# Patient Record
Sex: Male | Born: 1965 | Race: White | Hispanic: No | Smoking: Current every day smoker
Health system: Southern US, Community
[De-identification: ages and names within clinical notes are randomized; demographics above are authoritative.]

## PROBLEM LIST (undated history)

## (undated) DIAGNOSIS — I471 Supraventricular tachycardia, unspecified: Secondary | ICD-10-CM

## (undated) DIAGNOSIS — F4310 Post-traumatic stress disorder, unspecified: Secondary | ICD-10-CM

## (undated) DIAGNOSIS — Z95810 Presence of automatic (implantable) cardiac defibrillator: Secondary | ICD-10-CM

## (undated) DIAGNOSIS — I2510 Atherosclerotic heart disease of native coronary artery without angina pectoris: Secondary | ICD-10-CM

## (undated) DIAGNOSIS — F419 Anxiety disorder, unspecified: Secondary | ICD-10-CM

## (undated) DIAGNOSIS — I639 Cerebral infarction, unspecified: Secondary | ICD-10-CM

## (undated) DIAGNOSIS — I712 Thoracic aortic aneurysm, without rupture: Secondary | ICD-10-CM

## (undated) DIAGNOSIS — I480 Paroxysmal atrial fibrillation: Secondary | ICD-10-CM

## (undated) DIAGNOSIS — E119 Type 2 diabetes mellitus without complications: Secondary | ICD-10-CM

## (undated) DIAGNOSIS — F1910 Other psychoactive substance abuse, uncomplicated: Secondary | ICD-10-CM

## (undated) DIAGNOSIS — I10 Essential (primary) hypertension: Secondary | ICD-10-CM

## (undated) DIAGNOSIS — I209 Angina pectoris, unspecified: Secondary | ICD-10-CM

## (undated) DIAGNOSIS — I509 Heart failure, unspecified: Secondary | ICD-10-CM

## (undated) DIAGNOSIS — F329 Major depressive disorder, single episode, unspecified: Secondary | ICD-10-CM

## (undated) DIAGNOSIS — E7800 Pure hypercholesterolemia, unspecified: Secondary | ICD-10-CM

## (undated) DIAGNOSIS — G4733 Obstructive sleep apnea (adult) (pediatric): Secondary | ICD-10-CM

## (undated) DIAGNOSIS — I219 Acute myocardial infarction, unspecified: Secondary | ICD-10-CM

## (undated) DIAGNOSIS — F32A Depression, unspecified: Secondary | ICD-10-CM

## (undated) DIAGNOSIS — I7121 Aneurysm of the ascending aorta, without rupture: Secondary | ICD-10-CM

## (undated) DIAGNOSIS — R011 Cardiac murmur, unspecified: Secondary | ICD-10-CM

## (undated) DIAGNOSIS — M1990 Unspecified osteoarthritis, unspecified site: Secondary | ICD-10-CM

## (undated) HISTORY — PX: TONSILLECTOMY: SUR1361

## (undated) HISTORY — PX: MYOMECTOMY: SHX85

## (undated) HISTORY — DX: Obstructive sleep apnea (adult) (pediatric): G47.33

## (undated) HISTORY — DX: Depression, unspecified: F32.A

## (undated) HISTORY — DX: Anxiety disorder, unspecified: F41.9

## (undated) HISTORY — DX: Thoracic aortic aneurysm, without rupture: I71.2

## (undated) HISTORY — DX: Atherosclerotic heart disease of native coronary artery without angina pectoris: I25.10

## (undated) HISTORY — DX: Paroxysmal atrial fibrillation: I48.0

## (undated) HISTORY — PX: CARDIAC CATHETERIZATION: SHX172

## (undated) HISTORY — DX: Aneurysm of the ascending aorta, without rupture: I71.21

## (undated) HISTORY — DX: Other psychoactive substance abuse, uncomplicated: F19.10

## (undated) HISTORY — DX: Supraventricular tachycardia, unspecified: I47.10

## (undated) HISTORY — DX: Post-traumatic stress disorder, unspecified: F43.10

## (undated) HISTORY — DX: Supraventricular tachycardia: I47.1

## (undated) HISTORY — PX: CERVICAL DISCECTOMY: SHX98

## (undated) HISTORY — DX: Major depressive disorder, single episode, unspecified: F32.9

---

## 2013-02-13 DIAGNOSIS — Z9889 Other specified postprocedural states: Secondary | ICD-10-CM | POA: Insufficient documentation

## 2013-11-27 DIAGNOSIS — F259 Schizoaffective disorder, unspecified: Secondary | ICD-10-CM | POA: Insufficient documentation

## 2014-01-31 DIAGNOSIS — I639 Cerebral infarction, unspecified: Secondary | ICD-10-CM

## 2014-01-31 HISTORY — DX: Cerebral infarction, unspecified: I63.9

## 2014-02-04 DIAGNOSIS — J9811 Atelectasis: Secondary | ICD-10-CM | POA: Insufficient documentation

## 2014-02-05 DIAGNOSIS — E8770 Fluid overload, unspecified: Secondary | ICD-10-CM | POA: Insufficient documentation

## 2014-02-20 DIAGNOSIS — I4892 Unspecified atrial flutter: Secondary | ICD-10-CM | POA: Insufficient documentation

## 2015-04-30 ENCOUNTER — Ambulatory Visit: Payer: Self-pay | Admitting: Cardiovascular Disease

## 2015-05-27 ENCOUNTER — Encounter: Payer: Self-pay | Admitting: Family Medicine

## 2015-05-27 ENCOUNTER — Ambulatory Visit (INDEPENDENT_AMBULATORY_CARE_PROVIDER_SITE_OTHER): Payer: Commercial Managed Care - HMO | Admitting: Family Medicine

## 2015-05-27 VITALS — BP 146/91 | HR 68 | Temp 98.4°F | Ht 67.5 in | Wt 286.9 lb

## 2015-05-27 DIAGNOSIS — G4733 Obstructive sleep apnea (adult) (pediatric): Secondary | ICD-10-CM

## 2015-05-27 DIAGNOSIS — F17200 Nicotine dependence, unspecified, uncomplicated: Secondary | ICD-10-CM

## 2015-05-27 DIAGNOSIS — F411 Generalized anxiety disorder: Secondary | ICD-10-CM | POA: Diagnosis not present

## 2015-05-27 DIAGNOSIS — I10 Essential (primary) hypertension: Secondary | ICD-10-CM | POA: Diagnosis not present

## 2015-05-27 DIAGNOSIS — I422 Other hypertrophic cardiomyopathy: Secondary | ICD-10-CM | POA: Diagnosis not present

## 2015-05-27 DIAGNOSIS — F319 Bipolar disorder, unspecified: Secondary | ICD-10-CM | POA: Insufficient documentation

## 2015-05-27 MED ORDER — ORPHENADRINE CITRATE ER 100 MG PO TB12: 100.0000 mg | ORAL_TABLET | Freq: Two times a day (BID) | ORAL | Status: DC | PRN

## 2015-05-27 MED ORDER — METOPROLOL SUCCINATE ER 50 MG PO TB24: 50.0000 mg | ORAL_TABLET | Freq: Every day | ORAL | Status: DC

## 2015-05-27 MED ORDER — FUROSEMIDE 20 MG PO TABS: 20.0000 mg | ORAL_TABLET | Freq: Every day | ORAL | Status: DC

## 2015-05-27 MED ORDER — LOSARTAN POTASSIUM 50 MG PO TABS: 50.0000 mg | ORAL_TABLET | Freq: Every day | ORAL | Status: DC

## 2015-05-27 MED ORDER — DILTIAZEM HCL ER COATED BEADS 240 MG PO CP24: 240.0000 mg | ORAL_CAPSULE | Freq: Every day | ORAL | Status: DC

## 2015-05-27 NOTE — Patient Instructions (Signed)
Good to see you today!  Thanks for coming in.  We will start losartan 50 mg once daily - if any throat problems or cough stop and call us  Continue other medications as your are  We refer you to cardiology  We will send for your records  Come back in 2 weeks for a blood pressure and lab check  I will send in refills to [facility] mail order

## 2015-05-28 ENCOUNTER — Encounter: Payer: Self-pay | Admitting: Family Medicine

## 2015-05-28 ENCOUNTER — Telehealth: Payer: Self-pay | Admitting: Family Medicine

## 2015-05-28 DIAGNOSIS — F17200 Nicotine dependence, unspecified, uncomplicated: Secondary | ICD-10-CM | POA: Insufficient documentation

## 2015-05-28 MED ORDER — METOPROLOL SUCCINATE ER 50 MG PO TB24: 50.0000 mg | ORAL_TABLET | Freq: Every day | ORAL | Status: DC

## 2015-05-28 MED ORDER — FUROSEMIDE 20 MG PO TABS: 20.0000 mg | ORAL_TABLET | Freq: Every day | ORAL | Status: DC

## 2015-05-28 MED ORDER — DILTIAZEM HCL ER COATED BEADS 240 MG PO CP24: 240.0000 mg | ORAL_CAPSULE | Freq: Every day | ORAL | Status: DC

## 2015-05-28 NOTE — Telephone Encounter (Addendum)
I sent rx to [facility] at [place]  Pls remind him to sign his ROI forms for his Cardiologist and [facility]  Thanks  [employer]

## 2015-05-28 NOTE — Assessment & Plan Note (Signed)
BP Readings from Last 3 Encounters:  05/27/15 146/91   Near goal today but given history of decreased ejection fraction will add ARB at low dose.   Need to check labs on return

## 2015-05-28 NOTE — Telephone Encounter (Signed)
LM for patient to call back. Jazmin Hartsell,CMA  

## 2015-05-28 NOTE — Assessment & Plan Note (Signed)
Will send for records and investigate his symptoms further

## 2015-05-28 NOTE — Telephone Encounter (Signed)
Pt called because his medications Lasix, Toprol, and Cartia-xt will not be available to him for 7-10 business days because of the mail order process. He would like a 2 week supply of these 3 medications [facility] on [address] so that he will not be without his medication. Please call patient and let him know so that he can go pick these up from [facility]. jw

## 2015-05-28 NOTE — Assessment & Plan Note (Signed)
Stable  Send for records.  Refer to Cardiology for follow up and likely echo

## 2015-05-28 NOTE — Assessment & Plan Note (Signed)
Seems controlled although not on SSRI and using as needed ativan.  Will send for records

## 2015-05-28 NOTE — Progress Notes (Signed)
   Subjective:    Patient ID: Kyle Peterson, male    DOB: [birth year], 50 y.o.   MRN: [id number]  HPI Here to establish care.  Moving from [place] has lived in [place] and [place] in the past.  HYPERTENSION Disease Monitoring Home BP Monitoring (Severity) has not been checking Symptoms - Chest pain- no    Dyspnea- no Medications(Modifying factors) Compliance-  Had been prescribed Lisinopril in past but had cough and throat tightness - no shortness of breath or swelling.  He stopped taking on his own and never sought other medication  Lightheadedness-  no  Edema- trace from ankle injury Timing - continuous  Duration - years  Anxiety Long history of being anxious and active.  Has ativan which he uses rarely.  Has history of substance abuse.  No longer using any illicit drugs or alcohol.  Feels it is pretty well controlled currently  Sleep Apnea Had home sleep study in [place].  Was told had sleep apnea but never got CPAP.  He is not sure if he has any apnic spells.  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  Relates had atrial fibrillation and decresed ejection fraction after surgery and had AID placed in Jan 2016.   Has some fatigue with exercise but no chest pain other than twinges at insertion site.  Minimal edema but has been on lasix since surgery.  Unsure when his last echo was.  Was told he had an MI in past from testing he underwent prior to surgery.    Has been prescribed Lipitor in past but stopped taking months ago - Rx ran out  Tobacco Use 1/2 ppd.  No strong desire to stop currenlty   Patient reports no  vision/ hearing changes,anorexia, weight change, fever ,adenopathy, persistant / recurrent hoarseness, swallowing issues, chest pain, edema,persistant / recurrent cough, hemoptysis, dyspnea(rest, exertional, paroxysmal nocturnal), gastrointestinal  bleeding (melena, rectal bleeding), abdominal pain, excessive heart burn, GU symptoms(dysuria, hematuria,  pyuria, voiding/incontinence  Issues) syncope, focal weakness, severe memory loss, depression,, abnormal bruising/bleeding, major joint swelling.    Chief Complaint noted Review of Symptoms - see HPI PMH - Smoking status noted.   Vital Signs reviewed    Review of Systems     Objective:   Physical Exam  Alert nad Heart - Regular rate and rhythm.  No murmurs, gallops or rubs.    Lungs:  Normal respiratory effort, chest expands symmetrically. Lungs are clear to auscultation, no crackles or wheezes. Abdomen: obese soft and non-tender without masses, organomegaly or hernias noted.  No guarding or rebound Extremities:  No cyanosis, edema, or deformity noted with good range of motion of all major joints.   Skin - dermatofibroma on left posterior shoulder Eye - Pupils Equal Round Reactive to light, Extraocular movements intact, Conjunctiva without redness or discharge Ears:  External ear exam shows no significant lesions or deformities.  Otoscopic examination reveals clear canals, tympanic membranes are intact bilaterally without bulging, retraction, inflammation or discharge. Hearing is grossly normal bilaterall Mouth - no lesions, mucous membranes are moist, no decaying teeth  Psych:  Cognition and judgment appear intact. Alert, communicative  and cooperative with normal attention span and concentration. No apparent delusions, illusions, hallucinations Neck:  No deformities, thyromegaly, masses, or tenderness noted.   Supple with full range of motion without pain.        Assessment & Plan:

## 2015-05-29 NOTE — Telephone Encounter (Signed)
Pt did come by and sign forms, pt also informed of RXs at the pharmacy.

## 2015-06-10 ENCOUNTER — Ambulatory Visit (INDEPENDENT_AMBULATORY_CARE_PROVIDER_SITE_OTHER): Payer: Commercial Managed Care - HMO | Admitting: Family Medicine

## 2015-06-10 ENCOUNTER — Encounter: Payer: Self-pay | Admitting: Family Medicine

## 2015-06-10 VITALS — BP 122/88 | HR 78 | Temp 98.6°F | Ht 67.5 in | Wt 288.0 lb

## 2015-06-10 DIAGNOSIS — Z7251 High risk heterosexual behavior: Secondary | ICD-10-CM | POA: Diagnosis not present

## 2015-06-10 DIAGNOSIS — E785 Hyperlipidemia, unspecified: Secondary | ICD-10-CM | POA: Diagnosis not present

## 2015-06-10 DIAGNOSIS — F17200 Nicotine dependence, unspecified, uncomplicated: Secondary | ICD-10-CM

## 2015-06-10 DIAGNOSIS — G4733 Obstructive sleep apnea (adult) (pediatric): Secondary | ICD-10-CM

## 2015-06-10 DIAGNOSIS — M25511 Pain in right shoulder: Secondary | ICD-10-CM

## 2015-06-10 DIAGNOSIS — M25512 Pain in left shoulder: Secondary | ICD-10-CM

## 2015-06-10 DIAGNOSIS — I10 Essential (primary) hypertension: Secondary | ICD-10-CM | POA: Diagnosis not present

## 2015-06-10 DIAGNOSIS — F419 Anxiety disorder, unspecified: Secondary | ICD-10-CM | POA: Diagnosis not present

## 2015-06-10 DIAGNOSIS — F411 Generalized anxiety disorder: Secondary | ICD-10-CM | POA: Diagnosis not present

## 2015-06-10 LAB — CBC
HCT: 45.1 % (ref 38.5–50.0)
HEMOGLOBIN: 13.7 g/dL (ref 13.2–17.1)
MCH: 22.1 pg — AB (ref 27.0–33.0)
MCHC: 30.4 g/dL — ABNORMAL LOW (ref 32.0–36.0)
MCV: 72.7 fL — ABNORMAL LOW (ref 80.0–100.0)
Platelets: 325 10*3/uL (ref 140–400)
RBC: 6.2 MIL/uL — AB (ref 4.20–5.80)
RDW: 19.3 % — ABNORMAL HIGH (ref 11.0–15.0)
WBC: 9.4 10*3/uL (ref 3.8–10.8)

## 2015-06-10 LAB — COMPREHENSIVE METABOLIC PANEL
ALK PHOS: 89 U/L (ref 40–115)
ALT: 16 U/L (ref 9–46)
AST: 18 U/L (ref 10–40)
Albumin: 4.4 g/dL (ref 3.6–5.1)
BUN: 11 mg/dL (ref 7–25)
CALCIUM: 9 mg/dL (ref 8.6–10.3)
CO2: 28 mmol/L (ref 20–31)
Chloride: 103 mmol/L (ref 98–110)
Creat: 1.2 mg/dL (ref 0.60–1.35)
GLUCOSE: 124 mg/dL — AB (ref 65–99)
POTASSIUM: 4.3 mmol/L (ref 3.5–5.3)
Sodium: 141 mmol/L (ref 135–146)
Total Bilirubin: 0.4 mg/dL (ref 0.2–1.2)
Total Protein: 7.1 g/dL (ref 6.1–8.1)

## 2015-06-10 LAB — LIPID PANEL
CHOLESTEROL: 211 mg/dL — AB (ref 125–200)
HDL: 38 mg/dL — AB (ref >=40)
LDL Cholesterol: 133 mg/dL — ABNORMAL HIGH (ref <130)
TRIGLYCERIDES: 200 mg/dL — AB (ref <150)
Total CHOL/HDL Ratio: 5.6 Ratio — ABNORMAL HIGH (ref <=5.0)
VLDL: 40 mg/dL — ABNORMAL HIGH (ref <30)

## 2015-06-10 NOTE — Patient Instructions (Signed)
Good to see you today!  Thanks for coming in.  See your cardiologist  - take your paperwork  I will call you if your tests are not good.  Otherwise I will send you a letter.  If you do not hear from me with in 2 weeks please call our office.     Continue the medications as you are  Try to establish a routine - same time to bed and getting up and regular smaller meals   Come up with a plan to stop smoking - Find a substitute   Come back in 3 months or sooner if anything is worsening

## 2015-06-11 DIAGNOSIS — M25519 Pain in unspecified shoulder: Secondary | ICD-10-CM | POA: Insufficient documentation

## 2015-06-11 LAB — HIV ANTIBODY (ROUTINE TESTING W REFLEX): HIV 1&2 Ab, 4th Generation: NONREACTIVE

## 2015-06-11 LAB — HEPATITIS C ANTIBODY: HCV AB: REACTIVE — AB

## 2015-06-11 NOTE — Assessment & Plan Note (Signed)
See avs

## 2015-06-11 NOTE — Assessment & Plan Note (Addendum)
Will refer to surgery.  Gave patient his medical records to give to ortho

## 2015-06-11 NOTE — Assessment & Plan Note (Signed)
BP Readings from Last 3 Encounters:  06/10/15 122/88  05/27/15 146/91   At goal today

## 2015-06-11 NOTE — Assessment & Plan Note (Signed)
Doubt is at goal check labs.   Likely restart statin

## 2015-06-11 NOTE — Progress Notes (Signed)
   Subjective:    Patient ID: Kyle Peterson, male    DOB: Apr 09, 1965, 50 y.o.   MRN: [id number]  HPI  HYPERTENSION Disease Monitoring Home BP Monitoring (Severity) has not been checking Symptoms - Chest pain- no    Dyspnea- no Medications(Modifying factors) Compliance-  Taking medications . Lightheadedness-  no  Edema- no Timing - continuous  Duration - years ROS - See HPI  PMH Lab Review   POTASSIUM  Date Value Ref Range Status  06/10/2015 4.3 3.5 - 5.3 mmol/L Final   SODIUM  Date Value Ref Range Status  06/10/2015 141 135 - 146 mmol/L Final   CREAT  Date Value Ref Range Status  06/10/2015 1.20 0.60 - 1.35 mg/dL Final        HYPERLIPIDEMIA Symptoms Chest pain on exertion:  has   Leg claudication:   no Medications (modifying factor): Compliance- has been off statin for months Right upper quadrant pain- no  Muscle aches- no Duration - years   Timing - continuous    Component Value Date/Time   CHOL 211* 06/10/2015 0924   TRIG 200* 06/10/2015 0924   HDL 38* 06/10/2015 0924   VLDL 40* 06/10/2015 0924   CHOLHDL 5.6* 06/10/2015 0924    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  Told had torn rotator cuff and capsulitis in past How interferes with function (severity): difficult to lift overhead Medications: nsaids and tylenol Course:has had for years but could not get operated on with recent HOCUM diagnosis and surgery  Stomach Pain: no Blood in stools: no Rash:no Fever: no   Chief Complaint noted Review of Symptoms - see HPI PMH - Smoking status noted.   Vital Signs reviewed   Review of Systems     Objective:   Physical Exam  Alert nad Limited ROM in both shoulder especially abduction Distal hand strenght and senation are intact Heart - Distant Regular rate and rhythm.  No murmurs, gallops or rubs.    Lungs:  Normal respiratory effort, chest expands symmetrically. Lungs are clear to auscultation, no crackles or wheezes.         Assessment & Plan:

## 2015-06-11 NOTE — Assessment & Plan Note (Signed)
Once has cardiology evaluation will refer for sleep study

## 2015-06-12 LAB — HEPATITIS C RNA QUANTITATIVE: HCV Quantitative: NOT DETECTED IU/mL (ref <15)

## 2015-06-15 ENCOUNTER — Encounter: Payer: Self-pay | Admitting: Internal Medicine

## 2015-06-15 ENCOUNTER — Ambulatory Visit (INDEPENDENT_AMBULATORY_CARE_PROVIDER_SITE_OTHER): Payer: Commercial Managed Care - HMO | Admitting: Internal Medicine

## 2015-06-15 VITALS — BP 139/92 | HR 71 | Temp 98.2°F | Ht 67.5 in | Wt 288.5 lb

## 2015-06-15 DIAGNOSIS — H00013 Hordeolum externum right eye, unspecified eyelid: Secondary | ICD-10-CM | POA: Diagnosis not present

## 2015-06-15 MED ORDER — TOBRAMYCIN 0.3 % OP SOLN: 2.0000 [drp] | OPHTHALMIC | Status: DC

## 2015-06-15 NOTE — Patient Instructions (Signed)
[name],  I have prescribed tobramycin eye drops and please use warm compresses as often as you can tolerate to encourage drainage.  If you do not have improvement in swelling by 48 hours from now, please come back to clinic.  Best, Dr. Sampson GoonFitzgerald

## 2015-06-15 NOTE — Progress Notes (Signed)
   Subjective:    Patient ID: Kyle Peterson, male    DOB: 08/06/1965, 50 y.o.   MRN: [id number]  HPI  Kyle Peterson is a 49-y/o male who presents with concern for stye.  Stye: - Began 3-4 days ago of right upper eyelid. - Swelling has gotten worse since yesterday and eye feels irritated. - Noted a crust in his eye this morning.  - Has been taking naproxen with little relief. Just began using warm compresses this morning.  - Feels stye has come to a head but denies any drainage yet. - Had a stye about 1-2 years ago that resolved with warm compresses and tobramycin eye drops - Denies any recent injury to eye  Review of Systems  Constitutional: Negative for fever and appetite change.  HENT: Negative for congestion and rhinorrhea.   Eyes: Negative for visual disturbance.  Gastrointestinal: Negative for nausea, vomiting and diarrhea.      Objective: Blood pressure 139/92, pulse 71, temperature 98.2 F (36.8 C), temperature source Oral, height 5' 7.5" (1.715 m), weight 288 lb 8 oz (130.863 kg).   Physical Exam  Constitutional: He appears well-developed and well-nourished. No distress.  Eyes: EOM are normal. Pupils are equal, round, and reactive to light.  Bilateral conjunctival epithelial inclusion cysts beneath lateral corners of upper eyelids.  Skin:  Erythema of right upper eyelid surrounding small pustule.            Assessment & Plan:  Patient with stye and no systemic symptoms. Swelling is worsening. Recommended warm compresses and prescribed tobramycin drops. Conjunctival epithelial inclusion cysts noted on exam, but patient denies being bothered by them or having eye pain except for stye.   Return within 48 hours if swelling is not improving. Referral to ophthalmology for excision would likely be warranted at that time.   Dani GobbleHillary Fitzgerald, MD [facility], PGY-1

## 2015-06-16 ENCOUNTER — Encounter: Payer: Self-pay | Admitting: Cardiology

## 2015-06-16 ENCOUNTER — Ambulatory Visit (HOSPITAL_COMMUNITY)
Admission: EM | Admit: 2015-06-16 | Discharge: 2015-06-16 | Disposition: A | Discharge status: Home / Self Care | Payer: Commercial Managed Care - HMO | Attending: Family Medicine | Admitting: Family Medicine

## 2015-06-16 ENCOUNTER — Ambulatory Visit (INDEPENDENT_AMBULATORY_CARE_PROVIDER_SITE_OTHER): Payer: Commercial Managed Care - HMO

## 2015-06-16 ENCOUNTER — Encounter: Payer: Self-pay | Admitting: Family Medicine

## 2015-06-16 DIAGNOSIS — M545 Low back pain, unspecified: Secondary | ICD-10-CM

## 2015-06-16 DIAGNOSIS — M47816 Spondylosis without myelopathy or radiculopathy, lumbar region: Secondary | ICD-10-CM | POA: Diagnosis not present

## 2015-06-16 NOTE — ED Provider Notes (Signed)
CSN: [id number]     Arrival date & time 06/16/15  1404 History   First MD Initiated Contact with Patient 06/16/15 1556     Chief Complaint  Patient presents with  . Back Pain   (Consider location/radiation/quality/duration/timing/severity/associated sxs/prior Treatment) HPI History obtained from patient:  Pt presents with the cc WU:[id number] pain Duration of symptoms: MANY YEARS Treatment prior to arrival: NONE, GETTING WORSE Context:ONGOING BACK PAIN FOR YEARS, RECENTLY MOVED FROM [place], AND HAS NOT SEEN A BACK PHYSICIAN IN [place]. NO MRI.  Other symptoms include: PAIN WITH LIFTING LEGS.  Pain score: FAMILY HISTORY:MOTHER WITH DM, HTN SOCIAL HISTORY:SMOKER  Past Medical History  Diagnosis Date  . Anxiety   . Sleep apnea   . Substance abuse    Past Surgical History  Procedure Laterality Date  . Myomectomy     Family History  Problem Relation Age of Onset  . Diabetes Mother   . Hypertension Mother   . Diabetes Father   . Hypertension Father   . Heart disease Father   . Alcohol abuse Brother   . Drug abuse Brother    Social History  Substance Use Topics  . Smoking status: Current Every Day Smoker -- 0.50 packs/day    Types: Cigarettes  . Smokeless tobacco: None  . Alcohol Use: None    Review of Systems ROS +'ve chronic back pain  Denies: HEADACHE, NAUSEA, ABDOMINAL PAIN, CHEST PAIN, CONGESTION, DYSURIA, SHORTNESS OF BREATH  Allergies  Ace inhibitors  Home Medications   Prior to Admission medications   Medication Sig Start Date End Date Taking? Authorizing Provider  aspirin 81 MG tablet Take 81 mg by mouth daily.    Historical Provider, MD  diltiazem (CARTIA XT) 240 MG 24 hr capsule Take 1 capsule (240 mg total) by mouth daily. 05/28/15   Carney Living, MD  furosemide (LASIX) 20 MG tablet Take 1 tablet (20 mg total) by mouth daily. 05/28/15   Carney Living, MD  LORazepam (ATIVAN) 0.5 MG tablet Take 0.5 mg by mouth 2 (two) times daily as needed for  anxiety. Reported on 06/10/2015 05/27/15   Carney Living, MD  losartan (COZAAR) 50 MG tablet Take 1 tablet (50 mg total) by mouth daily. 05/27/15   Carney Living, MD  metoprolol succinate (TOPROL-XL) 50 MG 24 hr tablet Take 1 tablet (50 mg total) by mouth daily. Take with or immediately following a meal. 05/28/15   Carney Living, MD  naproxen sodium (ANAPROX) 220 MG tablet Take 220 mg by mouth 2 (two) times daily as needed.    Historical Provider, MD  orphenadrine (NORFLEX) 100 MG tablet Take 1 tablet (100 mg total) by mouth 2 (two) times daily as needed for muscle spasms. 05/27/15   Carney Living, MD  tobramycin (TOBREX) 0.3 % ophthalmic solution Place 2 drops into the right eye every 4 (four) hours. 06/15/15   Hillary Percell Boston, MD   Meds Ordered and Administered this Visit  Medications - No data to display  BP 141/84 mmHg  Pulse 75  Temp(Src) 98.2 F (36.8 C) (Oral)  Resp 16  SpO2 100% No data found.   Physical Exam NURSES NOTES AND VITAL SIGNS REVIEWED. CONSTITUTIONAL: Well developed, well nourished, no acute distress HEENT: normocephalic, atraumatic EYES: Conjunctiva normal NECK:normal ROM, supple, no adenopathy PULMONARY:No respiratory distress, normal effort ABDOMINAL: Soft, ND, NT BS+, No CVAT MUSCULOSKELETAL: Normal ROM of all extremities,LUMBAR SPINE: WITHOUT DEFORMITY, REDNESS, HEAT, SPASM. SLR: DOES PRODUCE PAIN AT 45 DEGREES RIGHT LEG.  SKIN: warm and dry without rash PSYCHIATRIC: Mood and affect, behavior are normal  ED Course  Procedures (including critical care time)  Labs Review Labs Reviewed - No data to display  Imaging Review Dg Lumbar Spine Complete  06/16/2015  CLINICAL DATA:  Low back pain for several hours, no known injury, initial encounter EXAM: LUMBAR SPINE - COMPLETE 4+ VIEW COMPARISON:  None. FINDINGS: Mild osteophytic changes are noted. Vertebral body height is well maintained. Very minimal degenerative anterolisthesis  of L4 on L5 is noted. No acute soft tissue abnormality is seen. IMPRESSION: Mild degenerative change without acute abnormality. Electronically Signed   By: Alcide CleverMark  Lukens M.D.   On: 06/16/2015 16:39   I HAVE PERSONALLY  REVIEWED AND DISCUSSED RESULTS OF  X-RAYS WITH PATIENT PRIOR TO DISCHARGE.     Visual Acuity Review  Right Eye Distance:   Left Eye Distance:   Bilateral Distance:    Right Eye Near:   Left Eye Near:    Bilateral Near:         MDM   1. Back pain at L4-L5 level     Patient is reassured that there are no issues that require transfer to higher level of care at this time or additional tests. Patient is advised to continue home symptomatic treatment. Patient is advised that if there are new or worsening symptoms to attend the emergency department, contact primary care provider, or return to UC. Instructions of care provided discharged home in stable condition.    THIS NOTE WAS GENERATED USING A VOICE RECOGNITION SOFTWARE PROGRAM. ALL REASONABLE EFFORTS  WERE MADE TO PROOFREAD THIS DOCUMENT FOR ACCURACY.  I have verbally reviewed the discharge instructions with the patient. A printed AVS was given to the patient.  All questions were answered prior to discharge.      Tharon AquasFrank C Cathren Sween, PA 06/16/15 [phone]

## 2015-06-16 NOTE — ED Notes (Signed)
C/o lower back pain Denies any injury Has not had any xrays for dx Has ortho appt coming soon waiting on referral for pcp

## 2015-06-16 NOTE — Discharge Instructions (Signed)
Back Pain, Adult Back pain is very common. The pain often gets better over time. The cause of back pain is usually not dangerous. Most people can learn to manage their back pain on their own.  HOME CARE  Watch your back pain for any changes. The following actions may help to lessen any pain you are feeling:  Stay active. Start with short walks on flat ground if you can. Try to walk farther each day.  Exercise regularly as told by your doctor. Exercise helps your back heal faster. It also helps avoid future injury by keeping your muscles strong and flexible.  Do not sit, drive, or stand in one place for more than 30 minutes.  Do not stay in bed. Resting more than 1-2 days can slow down your recovery.  Be careful when you bend or lift an object. Use good form when lifting:  Bend at your knees.  Keep the object close to your body.  Do not twist.  Sleep on a firm mattress. Lie on your side, and bend your knees. If you lie on your back, put a pillow under your knees.  Take medicines only as told by your doctor.  Put ice on the injured area.  Put ice in a plastic bag.  Place a towel between your skin and the bag.  Leave the ice on for 20 minutes, 2-3 times a day for the first 2-3 days. After that, you can switch between ice and heat packs.  Avoid feeling anxious or stressed. Find good ways to deal with stress, such as exercise.  Maintain a healthy weight. Extra weight puts stress on your back. GET HELP IF:   You have pain that does not go away with rest or medicine.  You have worsening pain that goes down into your legs or buttocks.  You have pain that does not get better in one week.  You have pain at night.  You lose weight.  You have a fever or chills. GET HELP RIGHT AWAY IF:   You cannot control when you poop (bowel movement) or pee (urinate).  Your arms or legs feel weak.  Your arms or legs lose feeling (numbness).  You feel sick to your stomach (nauseous) or  throw up (vomit).  You have belly (abdominal) pain.  You feel like you may pass out (faint).   This information is not intended to replace advice given to you by your health care provider. Make sure you discuss any questions you have with your health care provider.   Document Released: 07/06/2007 Document Revised: 02/07/2014 Document Reviewed: 05/21/2013 Elsevier Interactive Patient Education 2016 Elsevier Inc. Degenerative Disk Disease Degenerative disk disease is a condition caused by the changes that occur in spinal disks as you grow older. Spinal disks are soft and compressible disks located between the bones of your spine (vertebrae). These disks act like shock absorbers. Degenerative disk disease can affect the whole spine. However, the neck and lower back are most commonly affected. Many changes can occur in the spinal disks with aging, such as:  The spinal disks may dry and shrink.  Small tears may occur in the tough, outer covering of the disk (annulus).  The disk space may become smaller due to loss of water.  Abnormal growths in the bone (spurs) may occur. This can put pressure on the nerve roots exiting the spinal canal, causing pain.  The spinal canal may become narrowed. RISK FACTORS   Being overweight.  Having a family history of  degenerative disk disease.  Smoking.  There is increased risk if you are doing heavy lifting or have a sudden injury. SIGNS AND SYMPTOMS  Symptoms vary from person to person and may include:  Pain that varies in intensity. Some people have no pain, while others have severe pain. The location of the pain depends on the part of your backbone that is affected.  You will have neck or arm pain if a disk in the neck area is affected.  You will have pain in your back, buttocks, or legs if a disk in the lower back is affected.  Pain that becomes worse while bending, reaching up, or with twisting movements.  Pain that may start gradually and  then get worse as time passes. It may also start after a major or minor injury.  Numbness or tingling in the arms or legs. DIAGNOSIS  Your health care provider will ask you about your symptoms and about activities or habits that may cause the pain. He or she may also ask about any injuries, diseases, or treatments you have had. Your health care provider will examine you to check for the range of movement that is possible in the affected area, to check for strength in your extremities, and to check for sensation in the areas of the arms and legs supplied by different nerve roots. You may also have:   An X-ray of the spine.  Other imaging tests, such as MRI. TREATMENT  Your health care provider will advise you on the best plan for treatment. Treatment may include:  Medicines.  Rehabilitation exercises. HOME CARE INSTRUCTIONS   Follow proper lifting and walking techniques as advised by your health care provider.  Maintain good posture.  Exercise regularly as advised by your health care provider.  Perform relaxation exercises.  Change your sitting, standing, and sleeping habits as advised by your health care provider.  Change positions frequently.  Lose weight or maintain a healthy weight as advised by your health care provider.  Do not use any tobacco products, including cigarettes, chewing tobacco, or electronic cigarettes. If you need help quitting, ask your health care provider.  Wear supportive footwear.  Take medicines only as directed by your health care provider. SEEK MEDICAL CARE IF:   Your pain does not go away within 1-4 weeks.  You have significant appetite or weight loss. SEEK IMMEDIATE MEDICAL CARE IF:   Your pain is severe.  You notice weakness in your arms, hands, or legs.  You begin to lose control of your bladder or bowel movements.  You have fevers or night sweats. MAKE SURE YOU:   Understand these instructions.  Will watch your condition.  Will  get help right away if you are not doing well or get worse.   This information is not intended to replace advice given to you by your health care provider. Make sure you discuss any questions you have with your health care provider.   Document Released: 11/14/2006 Document Revised: 02/07/2014 Document Reviewed: 05/21/2013 Elsevier Interactive Patient Education [employer]

## 2015-06-17 ENCOUNTER — Telehealth: Payer: Self-pay | Admitting: Family Medicine

## 2015-06-17 ENCOUNTER — Ambulatory Visit (INDEPENDENT_AMBULATORY_CARE_PROVIDER_SITE_OTHER): Payer: Commercial Managed Care - HMO | Admitting: Student

## 2015-06-17 ENCOUNTER — Encounter: Payer: Self-pay | Admitting: Student

## 2015-06-17 VITALS — BP 145/85 | HR 70 | Temp 98.1°F | Ht 67.5 in | Wt 288.0 lb

## 2015-06-17 DIAGNOSIS — M549 Dorsalgia, unspecified: Secondary | ICD-10-CM

## 2015-06-17 DIAGNOSIS — G8929 Other chronic pain: Secondary | ICD-10-CM | POA: Diagnosis not present

## 2015-06-17 DIAGNOSIS — M5440 Lumbago with sciatica, unspecified side: Secondary | ICD-10-CM | POA: Diagnosis not present

## 2015-06-17 MED ORDER — KETOROLAC TROMETHAMINE 10 MG PO TABS: 10.0000 mg | ORAL_TABLET | Freq: Four times a day (QID) | ORAL | Status: DC | PRN

## 2015-06-17 NOTE — Telephone Encounter (Signed)
[name] went to Urgent Care yesterday and was told he needed to see Dr. Annell GreeningMark Yates the orthopedist.  Need referral put in with him.  Contact patient when referral have been made

## 2015-06-17 NOTE — Progress Notes (Signed)
   Subjective:    Patient ID: Kyle ArnJon Peterson, male    DOB: 1965/08/21, 50 y.o.   MRN: [id number]   CC: low back pain  HPI: 50 y/o M with history of chronic back pain presents for worsening back pain  Back pain - has been present for years ( >5 years) with occasional episodes of worsening with increasing frequency of these episodes for the last years -Patient's episodes occur every 1-6 months - Last episode started yesterday for which she was seen at urgent care with largely negative workup - X-ray lumbar spine showed mild degenerative of the L4-L5 region - He's been taking aspirin and Aleve for pain control - He does not wish to take anything stronger as he has a history of drug abuse - Denies weakness, loss of bowel or bladder function - Does report anterior thigh numbness which started yesterday  - has radiation of back pan to bilateral buttocks - He recently moved from [place] where, he states, his back pain was never worked up - he would like referral to ortho[pedics to consider surgery  Smoking status reviewed- smokes 10-12 cigarettes per day  Review of Systems Otherwise denies chest pain, shortness of breath,     Objective:  BP 145/85 mmHg  Pulse 70  Temp(Src) 98.1 F (36.7 C) (Oral)  Ht 5' 7.5" (1.715 m)  Wt 288 lb (130.636 kg)  BMI 44.42 kg/m2  SpO2 99% Vitals and nursing note reviewed  Exam limited by patient unwilling to climb on exam table due to concern for back pain  General: NAD, Obese  Cardiac: RRR,  Respiratory: CTAB, normal effort MSK: Mild spinal tenderness to palpation from the lumbar region to the sacrum, no paraspinous tenderness Extremities:  5 out of 5 strength bilateral lower extremities Skin: warm and dry, no rashes noted Neuro:  decreased sensation over bilateral anterior thighs    Assessment & Plan:    Chronic back pain Patient has chronic back pain which he feels is worsening over the last 2 years. Xray spine 5/16 did not show evidence of  acute pathology. Patient has a cardiac defibrillator in place limiting his ability to get an MRI. No evidence of red flag symptoms. Back pain likely contributed to by obesity as well. Anterior thigh numbness potentially due to nerve entrapment, a potential sequelae of obesity.  - Will given toradol for pain - will refer to orthopedics for evaluation      Alyssa A. Kennon RoundsHaney MD, MS Family Medicine Resident PGY-2 Pager [phone]

## 2015-06-17 NOTE — Patient Instructions (Addendum)
Return in 1 month for back pain You will be called regarding your orthopedic surgery appointment If you have worsening pain, weakness, loss of bowel or bladder function call the office or go to teh ED If you have questions or concerns, call the office at [phone]

## 2015-06-17 NOTE — Assessment & Plan Note (Addendum)
Patient has chronic back pain which he feels is worsening over the last 2 years. Xray spine 5/16 did not show evidence of acute pathology. Patient has a cardiac defibrillator in place limiting his ability to get an MRI. No evidence of red flag symptoms. Back pain likely contributed to by obesity as well. Anterior thigh numbness potentially due to nerve entrapment, a potential sequelae of obesity.  - Will given toradol for pain - will refer to orthopedics for evaluation

## 2015-06-17 NOTE — Progress Notes (Signed)
Electrophysiology Office Note   Date:  06/18/2015   ID:  Kyle ArnJon Thorns, DOB Aug 21, 1965, MRN [id number]  PCP:  Carney LivingMarshall L Chambliss, MD Primary Electrophysiologist:  Regan LemmingWill Martin Kloey Cazarez, MD    Chief Complaint  Patient presents with  . ICD check     History of Present Illness: Kyle Peterson is a 50 y.o. male who presents today for electrophysiology evaluation.   He has a history of hypertrophic cardiomyopathy and had a septal myectomy on 02/03/14 the clinic. He has an ICD, noncritical coronary disease, hypertension, hyperlipidemia, paroxysmal atrial fibrillation, sleep apnea not on CPAP, and obesity. He has a history of substance abuse, including heroin, cocaine, and heavy alcohol for 25 years, but he has been clean for the last 10 years. He is also a current smoker. [facility], he had experienced an episode of syncope. He also had significant mitral regurgitation prior to his operation.   Today, he denies symptoms of palpitations, chest pain, shortness of breath, orthopnea, PND, lower extremity edema, claudication, dizziness, presyncope, syncope, bleeding, or neurologic sequela. The patient is tolerating medications without difficulties and is otherwise without complaint today.    Past Medical History  Diagnosis Date  . Anxiety   . Sleep apnea   . Substance abuse    Past Surgical History  Procedure Laterality Date  . Myomectomy       Current Outpatient Prescriptions  Medication Sig Dispense Refill  . aspirin 81 MG tablet Take 81 mg by mouth daily.    Marland Kitchen. diltiazem (CARTIA XT) 240 MG 24 hr capsule Take 1 capsule (240 mg total) by mouth daily. 15 capsule 0  . furosemide (LASIX) 20 MG tablet Take 1 tablet (20 mg total) by mouth daily. 15 tablet 0  . LORazepam (ATIVAN) 0.5 MG tablet Take 0.5 mg by mouth 2 (two) times daily as needed for anxiety. Reported on 06/10/2015    . losartan (COZAAR) 50 MG tablet Take 1 tablet (50 mg total) by mouth daily. 90 tablet 3  .  metoprolol succinate (TOPROL-XL) 50 MG 24 hr tablet Take 1 tablet (50 mg total) by mouth daily. Take with or immediately following a meal. 15 tablet 0  . naproxen sodium (ANAPROX) 220 MG tablet Take 220 mg by mouth 2 (two) times daily as needed.    . orphenadrine (NORFLEX) 100 MG tablet Take 1 tablet (100 mg total) by mouth 2 (two) times daily as needed for muscle spasms. 90 tablet 0  . tobramycin (TOBREX) 0.3 % ophthalmic solution Place 2 drops into the right eye every 4 (four) hours. 5 mL 0   No current facility-administered medications for this visit.    Allergies:   Ace inhibitors   Social History:  The patient  reports that he has been smoking Cigarettes.  He has been smoking about 0.50 packs per day. He does not have any smokeless tobacco history on file.   Family History:  The patient's family history includes Alcohol abuse in his brother; Diabetes in his father and mother; Drug abuse in his brother; Heart disease in his father; Hypertension in his father and mother.    ROS:  Please see the history of present illness.   Otherwise, review of systems is positive for visual changes, anxiety, depression.   All other systems are reviewed and negative.    PHYSICAL EXAM: VS:  BP 120/78 mmHg  Pulse 71  Ht 5\' 7"  (1.702 m)  Wt 291 lb (131.997 kg)  BMI 45.57 kg/m2 , BMI  Body mass index is 45.57 kg/(m^2). GEN: Well nourished, well developed, in no acute distress HEENT: normal Neck: no JVD, carotid bruits, or masses Cardiac: RRR; no murmurs, rubs, or gallops,no edema  Respiratory:  clear to auscultation bilaterally, normal work of breathing GI: soft, nontender, nondistended, + BS MS: no deformity or atrophy Skin: warm and dry,  device pocket is well healed Neuro:  Strength and sensation are intact Psych: euthymic mood, full affect  EKG:  EKG is ordered today. The ekg ordered today shows sinus rhythm, LBBB, 1 degree AV block   Device interrogation is reviewed today in detail.  See  PaceArt for details.   Recent Labs: 06/10/2015: ALT 16; BUN 11; Creat 1.20; Hemoglobin 13.7; Platelets 325; Potassium 4.3; Sodium 141    Lipid Panel     Component Value Date/Time   CHOL 211* 06/10/2015 0924   TRIG 200* 06/10/2015 0924   HDL 38* 06/10/2015 0924   CHOLHDL 5.6* 06/10/2015 0924   VLDL 40* 06/10/2015 0924   LDLCALC 133* 06/10/2015 0924     Wt Readings from Last 3 Encounters:  06/18/15 291 lb (131.997 kg)  06/17/15 288 lb (130.636 kg)  06/15/15 288 lb 8 oz (130.863 kg)      Other studies Reviewed: Additional studies/ records that were reviewed today include: 09/11/14 echocardiogram  Review of the above records today demonstrates:  EF 50-55%. Asymmetric LV septal hypertrophy. Basal anteroseptal 1.8 cm. Hypokinesis of the basal and mid anteroseptal and inferior wall. No significant obstruction of the LVOT. Peak gradient in the LVOT is 13 mmHg. Mild mitral regurgitation. Left atrial cavity is mildly dilated.   ASSESSMENT AND PLAN:  1.  Hypertrophic cardiomyopathy: Currently he is feeling well without any major complaints. He says that since last being seen, at [place], he has been able to do all of his activities without any issues. He says that he has gained about 70 pounds since his most recent operation and he wishes to exercise which I have told him it is okay at this point. He has not noticed any palpitations, and therefore we'll continue current management for his atrial fibrillation.  2. Obstructive sleep apnea: Has been diagnosed with sleep apnea but currently does not wear CPAP. Dwanna Goshert refer for further evaluation.  3. Paroxysmal atrial fibrillation: Has not had any issues since around the time of his myectomy. We'll continue his current medications.  Nicollette Wilhelmi start Xarelto for a CHADS2VASc of 2. Diabetes mellitus  This patients CHA2DS2-VASc Score and unadjusted Ischemic Stroke Rate (% per year) is equal to 2.2 % stroke rate/year from a score of 2  Above score  calculated as 1 point each if present [CHF, HTN, DM, Vascular=MI/PAD/Aortic Plaque, Age if 65-74, or Male] Above score calculated as 2 points each if present [Age > 75, or Stroke/TIA/TE]       Current medicines are reviewed at length with the patient today.   The patient does not have concerns regarding his medicines.  The following changes were made today:  Xarelto  Labs/ tests ordered today include:  Orders Placed This Encounter  Procedures  . EKG 12-Lead     Disposition:   FU with Waverley Krempasky 1  year  Signed, Yesena Reaves Jorja Loa, MD  06/18/2015 12:16 PM     [facility] [address] [place] [address] [phone] (office) [phone] (fax)

## 2015-06-17 NOTE — Telephone Encounter (Signed)
Will forward to MD.  

## 2015-06-18 ENCOUNTER — Encounter: Payer: Self-pay | Admitting: Cardiology

## 2015-06-18 ENCOUNTER — Ambulatory Visit (INDEPENDENT_AMBULATORY_CARE_PROVIDER_SITE_OTHER): Payer: Commercial Managed Care - HMO | Admitting: Cardiology

## 2015-06-18 VITALS — BP 120/78 | HR 71 | Ht 67.0 in | Wt 291.0 lb

## 2015-06-18 DIAGNOSIS — I480 Paroxysmal atrial fibrillation: Secondary | ICD-10-CM

## 2015-06-18 DIAGNOSIS — Z95810 Presence of automatic (implantable) cardiac defibrillator: Secondary | ICD-10-CM

## 2015-06-18 DIAGNOSIS — I422 Other hypertrophic cardiomyopathy: Secondary | ICD-10-CM

## 2015-06-18 LAB — CUP PACEART INCLINIC DEVICE CHECK
Date Time Interrogation Session: 20170518040000
HIGH POWER IMPEDANCE MEASURED VALUE: 74 Ohm
HighPow Impedance: 55 Ohm
Implantable Lead Location: 753860
Implantable Lead Model: 292
Implantable Lead Serial Number: 358834
Lead Channel Impedance Value: 776 Ohm
Lead Channel Pacing Threshold Amplitude: 0.6 V
Lead Channel Pacing Threshold Pulse Width: 0.5 ms
Lead Channel Setting Sensing Sensitivity: 0.3 mV
MDC IDC LEAD IMPLANT DT: 20160218
MDC IDC MSMT LEADCHNL RV SENSING INTR AMPL: 24.5 mV
MDC IDC SET LEADCHNL RV PACING AMPLITUDE: 2 V
MDC IDC SET LEADCHNL RV PACING PULSEWIDTH: 0.5 ms
MDC IDC STAT BRADY RV PERCENT PACED: 1 % — AB
Pulse Gen Serial Number: 202913

## 2015-06-18 MED ORDER — RIVAROXABAN 20 MG PO TABS: 20.0000 mg | ORAL_TABLET | Freq: Every day | ORAL | Status: DC

## 2015-06-18 NOTE — Patient Instructions (Addendum)
Medication Instructions:  Your physician has recommended you make the following change in your medication:  1) STOP Aspirin 2) START Xarelto 20 mg daily  - if you need assistance in paying for this medication please call [phone]  Labwork: None ordered  Testing/Procedures: None ordered  Follow-Up: Your physician recommends that you schedule a follow-up appointment in: next available to establish with Dr. Mayford Knifeurner for cardiomyopathy and sleep apnea.  Your physician wants you to follow-up in: 3 months with device clinic. You will receive a reminder letter in the mail two months in advance. If you don't receive a letter, please call our office to schedule the follow-up appointment.  Your physician wants you to follow-up in: 1 year with Dr. Elberta Fortisamnitz.  You will receive a reminder letter in the mail two months in advance. If you don't receive a letter, please call our office to schedule the follow-up appointment.  If you need a refill on your cardiac medications before your next appointment, please call your pharmacy.  Thank you for choosing [facility]!!   Dory HornSherri Glorious Flicker, RN [phone]

## 2015-06-22 ENCOUNTER — Telehealth: Payer: Self-pay | Admitting: *Deleted

## 2015-06-22 NOTE — Telephone Encounter (Signed)
Called patient to advise that a new Latitude transmitter was ordered to his home address.  Advised that I also ordered him a cell adapter for his home monitor.  Gave him [employer]' phone number for assistance in setting up monitor when it is received.  Patient verbalizes understanding and denies additional questions or concerns at this time.  He is appreciative of assistance.

## 2015-07-07 DIAGNOSIS — M542 Cervicalgia: Secondary | ICD-10-CM | POA: Diagnosis not present

## 2015-07-07 DIAGNOSIS — M75122 Complete rotator cuff tear or rupture of left shoulder, not specified as traumatic: Secondary | ICD-10-CM | POA: Diagnosis not present

## 2015-07-07 DIAGNOSIS — M25511 Pain in right shoulder: Secondary | ICD-10-CM | POA: Diagnosis not present

## 2015-07-09 ENCOUNTER — Telehealth: Payer: Self-pay | Admitting: *Deleted

## 2015-07-09 NOTE — Telephone Encounter (Signed)
Pt wants a referral in for bariatric clinic. Deseree Bruna PotterBlount, CMA

## 2015-07-09 NOTE — Telephone Encounter (Signed)
I would need to see him to discuss and examine  We can talk about at his next visit or he can come in early   [facility]

## 2015-07-09 NOTE — Telephone Encounter (Signed)
Will forward to MD to advise. Jazmin Hartsell,CMA  

## 2015-07-10 NOTE — Telephone Encounter (Signed)
Patient is agreeable with plan and scheduled for 07-27-15 at 2:30pm. [facility] Hospital[facility]azmin Peterson,CMA

## 2015-07-13 ENCOUNTER — Telehealth: Payer: Self-pay | Admitting: Cardiology

## 2015-07-13 NOTE — Telephone Encounter (Signed)
[facility]

## 2015-07-18 ENCOUNTER — Emergency Department (HOSPITAL_COMMUNITY)
Admission: EM | Admit: 2015-07-18 | Discharge: 2015-07-18 | Disposition: A | Discharge status: Home / Self Care | Payer: Commercial Managed Care - HMO | Attending: Emergency Medicine | Admitting: Emergency Medicine

## 2015-07-18 ENCOUNTER — Other Ambulatory Visit: Payer: Self-pay

## 2015-07-18 ENCOUNTER — Emergency Department (HOSPITAL_COMMUNITY): Payer: Commercial Managed Care - HMO

## 2015-07-18 ENCOUNTER — Encounter (HOSPITAL_COMMUNITY): Payer: Self-pay

## 2015-07-18 DIAGNOSIS — I509 Heart failure, unspecified: Secondary | ICD-10-CM | POA: Insufficient documentation

## 2015-07-18 DIAGNOSIS — R0789 Other chest pain: Secondary | ICD-10-CM | POA: Diagnosis not present

## 2015-07-18 DIAGNOSIS — R079 Chest pain, unspecified: Secondary | ICD-10-CM | POA: Diagnosis not present

## 2015-07-18 DIAGNOSIS — I110 Hypertensive heart disease with heart failure: Secondary | ICD-10-CM | POA: Insufficient documentation

## 2015-07-18 DIAGNOSIS — F17210 Nicotine dependence, cigarettes, uncomplicated: Secondary | ICD-10-CM | POA: Diagnosis not present

## 2015-07-18 DIAGNOSIS — Z79899 Other long term (current) drug therapy: Secondary | ICD-10-CM | POA: Insufficient documentation

## 2015-07-18 DIAGNOSIS — R0602 Shortness of breath: Secondary | ICD-10-CM | POA: Diagnosis not present

## 2015-07-18 DIAGNOSIS — Z7901 Long term (current) use of anticoagulants: Secondary | ICD-10-CM | POA: Insufficient documentation

## 2015-07-18 HISTORY — DX: Pure hypercholesterolemia, unspecified: E78.00

## 2015-07-18 HISTORY — DX: Essential (primary) hypertension: I10

## 2015-07-18 LAB — CBC
HCT: 43.1 % (ref 39.0–52.0)
Hemoglobin: 13.3 g/dL (ref 13.0–17.0)
MCH: 22.5 pg — AB (ref 26.0–34.0)
MCHC: 30.9 g/dL (ref 30.0–36.0)
MCV: 72.8 fL — AB (ref 78.0–100.0)
PLATELETS: 264 10*3/uL (ref 150–400)
RBC: 5.92 MIL/uL — AB (ref 4.22–5.81)
RDW: 18.5 % — ABNORMAL HIGH (ref 11.5–15.5)
WBC: 10 10*3/uL (ref 4.0–10.5)

## 2015-07-18 LAB — BRAIN NATRIURETIC PEPTIDE: B NATRIURETIC PEPTIDE 5: 133.3 pg/mL — AB (ref 0.0–100.0)

## 2015-07-18 LAB — TROPONIN I
Troponin I: 0.04 ng/mL — ABNORMAL HIGH (ref <0.031)
Troponin I: 0.03 ng/mL (ref <0.031)

## 2015-07-18 LAB — COMPREHENSIVE METABOLIC PANEL
ALT: 19 U/L (ref 17–63)
AST: 23 U/L (ref 15–41)
Albumin: 3.9 g/dL (ref 3.5–5.0)
Alkaline Phosphatase: 94 U/L (ref 38–126)
Anion gap: 8 (ref 5–15)
BUN: 14 mg/dL (ref 6–20)
CALCIUM: 8.9 mg/dL (ref 8.9–10.3)
CHLORIDE: 105 mmol/L (ref 101–111)
CO2: 24 mmol/L (ref 22–32)
CREATININE: 1.08 mg/dL (ref 0.61–1.24)
Glucose, Bld: 157 mg/dL — ABNORMAL HIGH (ref 65–99)
Potassium: 3.7 mmol/L (ref 3.5–5.1)
Sodium: 137 mmol/L (ref 135–145)
Total Bilirubin: 0.3 mg/dL (ref 0.3–1.2)
Total Protein: 6.9 g/dL (ref 6.5–8.1)

## 2015-07-18 LAB — MAGNESIUM: MAGNESIUM: 2 mg/dL (ref 1.7–2.4)

## 2015-07-18 LAB — I-STAT TROPONIN, ED: Troponin i, poc: 0.01 ng/mL (ref 0.00–0.08)

## 2015-07-18 LAB — PROTIME-INR
INR: 1.93 — ABNORMAL HIGH (ref 0.00–1.49)
PROTHROMBIN TIME: 22 s — AB (ref 11.6–15.2)

## 2015-07-18 MED ORDER — SODIUM CHLORIDE 0.9 % IV SOLN
INTRAVENOUS | Status: DC
  Administered 2015-07-18: 12:00:00 via INTRAVENOUS

## 2015-07-18 MED ORDER — ASPIRIN 81 MG PO CHEW
324.0000 mg | CHEWABLE_TABLET | Freq: Once | ORAL | Status: AC
  Administered 2015-07-18: 324 mg via ORAL
  Filled 2015-07-18: qty 4

## 2015-07-18 MED ORDER — SODIUM CHLORIDE 0.9 % IV BOLUS (SEPSIS)
1000.0000 mL | Freq: Once | INTRAVENOUS | Status: AC
  Administered 2015-07-18: 1000 mL via INTRAVENOUS

## 2015-07-18 NOTE — ED Notes (Signed)
Per [employer] scientific representative pt. Has single chamber ICD, normal function, no abnormalities.

## 2015-07-18 NOTE — Discharge Instructions (Signed)
As discussed, your evaluation today has been largely reassuring.  But, it is important that you monitor your condition carefully, and do not hesitate to return to the ED if you develop new, or concerning changes in your condition. ? ?Otherwise, please follow-up with your physician for appropriate ongoing care. ? ?

## 2015-07-18 NOTE — ED Provider Notes (Signed)
CSN: [id number]     Arrival date & time 07/18/15  1103 History   First MD Initiated Contact with Patient 07/18/15 1110     Chief Complaint  Patient presents with  . Chest Pain    HPI  Patient presents after an episode of chest pain. Patient was at rest, working on a drum audio recording when he felt the onset of chest tightness, diaphoresis, dyspnea. Symptoms lasted about 15 minutes, improved with standing up and walking. Currently the patient has no complaints. He has a notable history of congestive heart failure, hypertrophic cardiomyopathy prior surgery, atrial fibrillation. He states that he takes all medication as directed, and prior to today's episode had no recent heart events or health concerns.   Past Medical History  Diagnosis Date  . Anxiety   . Sleep apnea   . Substance abuse   . Hypertension   . High cholesterol    Past Surgical History  Procedure Laterality Date  . Myomectomy     Family History  Problem Relation Age of Onset  . Diabetes Mother   . Hypertension Mother   . Diabetes Father   . Hypertension Father   . Heart disease Father   . Alcohol abuse Brother   . Drug abuse Brother    Social History  Substance Use Topics  . Smoking status: Current Every Day Smoker -- 0.50 packs/day    Types: Cigarettes  . Smokeless tobacco: None  . Alcohol Use: None    Review of Systems  Constitutional:       Per HPI, otherwise negative  HENT:       Per HPI, otherwise negative  Respiratory:       Per HPI, otherwise negative  Cardiovascular:       Per HPI, otherwise negative  Gastrointestinal: Negative for vomiting.  Endocrine:       Negative aside from HPI  Genitourinary:       Neg aside from HPI   Musculoskeletal:       Per HPI, otherwise negative  Skin: Negative.   Neurological: Negative for syncope.      Allergies  Ace inhibitors  Home Medications   Prior to Admission medications   Medication Sig Start Date End Date Taking? Authorizing  Provider  diltiazem (CARTIA XT) 240 MG 24 hr capsule Take 1 capsule (240 mg total) by mouth daily. 05/28/15  Yes Carney Living, MD  furosemide (LASIX) 20 MG tablet Take 1 tablet (20 mg total) by mouth daily. 05/28/15  Yes Carney Living, MD  LORazepam (ATIVAN) 0.5 MG tablet Take 0.5 mg by mouth 2 (two) times daily as needed for anxiety. Reported on 06/10/2015 05/27/15  Yes Carney Living, MD  losartan (COZAAR) 50 MG tablet Take 1 tablet (50 mg total) by mouth daily. 05/27/15  Yes Carney Living, MD  metoprolol succinate (TOPROL-XL) 50 MG 24 hr tablet Take 1 tablet (50 mg total) by mouth daily. Take with or immediately following a meal. 05/28/15  Yes Carney Living, MD  naproxen sodium (ANAPROX) 220 MG tablet Take 220 mg by mouth 2 (two) times daily as needed.   Yes Historical Provider, MD  orphenadrine (NORFLEX) 100 MG tablet Take 1 tablet (100 mg total) by mouth 2 (two) times daily as needed for muscle spasms. 05/27/15  Yes Carney Living, MD  rivaroxaban (XARELTO) 20 MG TABS tablet Take 1 tablet (20 mg total) by mouth daily with supper. 06/18/15  Yes Will Jorja Loa, MD  tobramycin (TOBREX) 0.3 %  ophthalmic solution Place 2 drops into the right eye every 4 (four) hours. 06/15/15  Yes Hillary Percell BostonMoen Fitzgerald, MD  rivaroxaban (XARELTO) 20 MG TABS tablet Take 1 tablet (20 mg total) by mouth daily with supper. 06/18/15   Will Jorja LoaMartin Camnitz, MD   BP 139/66 mmHg  Pulse 67  Temp(Src) 98.5 F (36.9 C) (Oral)  Resp 23  Ht 5\' 8"  (1.727 m)  Wt 280 lb (127.007 kg)  BMI 42.58 kg/m2  SpO2 97% Physical Exam  Constitutional: He is oriented to person, place, and time. He appears well-developed. No distress.  HENT:  Head: Normocephalic and atraumatic.  Eyes: Conjunctivae and EOM are normal.  Cardiovascular: Normal rate and regular rhythm.   Pulmonary/Chest: Effort normal. No stridor. No respiratory distress.  Abdominal: He exhibits no distension.  Musculoskeletal: He  exhibits no edema.  Neurological: He is alert and oriented to person, place, and time.  Skin: Skin is warm and dry.  Psychiatric: He has a normal mood and affect.  Nursing note and vitals reviewed.   ED Course  Procedures (including critical care time) Labs Review Labs Reviewed  CBC - Abnormal; Notable for the following:    RBC 5.92 (*)    MCV 72.8 (*)    MCH 22.5 (*)    RDW 18.5 (*)    All other components within normal limits  COMPREHENSIVE METABOLIC PANEL - Abnormal; Notable for the following:    Glucose, Bld 157 (*)    All other components within normal limits  PROTIME-INR - Abnormal; Notable for the following:    Prothrombin Time 22.0 (*)    INR 1.93 (*)    All other components within normal limits  TROPONIN I - Abnormal; Notable for the following:    Troponin I 0.04 (*)    All other components within normal limits  MAGNESIUM  BRAIN NATRIURETIC PEPTIDE  I-STAT TROPOININ, ED    Imaging Review Dg Chest 2 View  07/18/2015  CLINICAL DATA:  Patient with sharp upper left-sided chest pain and tightness. No reported shortness of breath. EXAM: CHEST  2 VIEW COMPARISON:  None. FINDINGS: Single lead AICD device overlies the left hemi thorax. Anterior cervical spinal fusion hardware. Patient status post median sternotomy. Low lung volumes. Cardiac contours upper limits of normal. No large area of pulmonary consolidation. No pleural effusion or pneumothorax. No acute osseous abnormality. IMPRESSION: Cardiac contours upper limits of normal. No acute cardiopulmonary process. Electronically Signed   By: Annia Beltrew  Davis M.D.   On: 07/18/2015 12:27   I have personally reviewed and evaluated these images and lab results as part of my medical decision-making.   EKG Interpretation   Date/Time:  Saturday July 18 2015 11:07:55 EDT Ventricular Rate:  80 PR Interval:  220 QRS Duration: 174 QT Interval:  448 QTC Calculation: 516 R Axis:   96 Text Interpretation:  Sinus rhythm with 1st degree  A-V block Rightward  axis Left bundle branch block Abnormal ECG Abnormal ekg Confirmed by  Gerhard MunchLOCKWOOD, Deeana Atwater  MD ([id number]) on 07/18/2015 11:15:14 AM     Cardiac 70 sinus normal Pulse ox 98% room air normal   12:46 PM Troponin positive. Patient had one brief episode of chest pain while here, this resolved spontaneously.   EKG #2 sinus rhythm, rate 57, left bundle branch block, unchanged. I discussed patient's case with our cardiology team. Patient will have repeat troponin in 2 hours.  3:24 PM Repeat troponin diminished. We discussed all findings, patient sitting upright, in no distress.  MDM  Patient with hypertrophic cardiomyopathy, atrial fibrillation presents after an episode of chest pain, diaphoresis, lightheadedness, dyspnea. Patient is essentially chest pain-free here, though he did have one brief episode. However, patient does have elevated troponin, given his history of ischemic disease, hypertrophy, patient was discussed with cardiology. With declining troponin cardiology felt the patient was appropriate for discharge with outpatient follow-up given that he is already using Xarelto, has close cardiology relationship. Patient is comfortable with this, prefers discharge to inpatient evaluation.    Gerhard Munch, MD 07/18/15 1524

## 2015-07-18 NOTE — ED Notes (Signed)
Patient complains of left anterior chest pain that started an hour prior to arrival. States that the pain lasted 10-3715minutes with some shortness of breath and diaphoresis. Cardiac hx

## 2015-07-20 ENCOUNTER — Telehealth: Payer: Self-pay | Admitting: Cardiology

## 2015-07-20 NOTE — Telephone Encounter (Signed)
Patient st he was seen in the ED for chest pain over the weekend. Prior to going to the hospital, he had sharp pain that "came and went with his heartbeat." It relieved with walking. He reports during this first episode, it felt like his defibrillator fired. His device was checked and "they never mentioned it again." He had another episode while in the emergency room with much less intensity.   See ED note: MDM  Patient with hypertrophic cardiomyopathy, atrial fibrillation presents after an episode of chest pain, diaphoresis, lightheadedness, dyspnea. Patient is essentially chest pain-free here, though he did have one brief episode. However, patient does have elevated troponin, given his history of ischemic disease, hypertrophy, patient was discussed with cardiology. With declining troponin cardiology felt the patient was appropriate for discharge with outpatient follow-up given that he is already using Xarelto, has close cardiology relationship. Patient is comfortable with this, prefers discharge to inpatient evaluation.     Patient st he only had one more episode when he left the ED and got in his car. It was "real quick," sharp, and resolved on its own.  He has not had any further episodes or symptoms since. Confirmed OV with Dr. Mayford Knifeurner July 17.  He awaits further instructions from Dr. Elberta Fortisamnitz.

## 2015-07-20 NOTE — Telephone Encounter (Signed)
New Message  Pt was seen @ [facility] for CP- was told to call/follow up. Please call back and discuss.

## 2015-07-20 NOTE — Telephone Encounter (Signed)
Patient wants to know if he "has a time limit" on his life after all that is going on with him. He would like to know Dr. Gershon Craneamnitz's "take" on his condition and what to expect for the future. Patient aware I will call him once discussed w/ physician.

## 2015-07-23 NOTE — Telephone Encounter (Signed)
Dr. Elberta Fortisamnitz called and spoke w/ pt today.

## 2015-07-27 ENCOUNTER — Encounter: Payer: Self-pay | Admitting: Family Medicine

## 2015-07-27 ENCOUNTER — Ambulatory Visit (INDEPENDENT_AMBULATORY_CARE_PROVIDER_SITE_OTHER): Payer: Commercial Managed Care - HMO | Admitting: Family Medicine

## 2015-07-27 VITALS — BP 118/65 | HR 74 | Temp 98.5°F | Wt 289.0 lb

## 2015-07-27 DIAGNOSIS — E785 Hyperlipidemia, unspecified: Secondary | ICD-10-CM | POA: Diagnosis not present

## 2015-07-27 DIAGNOSIS — F411 Generalized anxiety disorder: Secondary | ICD-10-CM | POA: Diagnosis not present

## 2015-07-27 NOTE — Assessment & Plan Note (Signed)
Stable  Will check FLP after 12 weeks of therapy

## 2015-07-27 NOTE — Progress Notes (Signed)
Subjective  Patient is presenting with the following illnesses  Depression Anxiety Feels has been worsening over last several weeks since has not been working.  Came in to discuss weight loss surgery and smoking cessation.  See PHQ9 and GAD7 scores.  Very positive for both.   No suicidal ideation.  No current drug use or etoh use.  He is not interested in taking medications if at all possible.   No weight loss or tremors  PMH - used to use drugs heavily Has been treated with Prozac, Abilify, Trazadone,Geodon, Remeron at various times  HYPERLIPIDEMIA Symptoms Chest pain on exertion:  no   Leg claudication:   no Medications (modifying factor): Compliance- taking lipitor regularly Right upper quadrant pain- no  Muscle aches- no Duration - years   Timing - continuous    Component Value Date/Time   CHOL 211* 06/10/2015 0924   TRIG 200* 06/10/2015 0924   HDL 38* 06/10/2015 0924   VLDL 40* 06/10/2015 0924   CHOLHDL 5.6* 06/10/2015 0924     Chief Complaint noted Review of Symptoms - see HPI PMH - Smoking status noted.     Objective Vital Signs reviewed Psych:  Cognition and judgment appear intact. Alert, communicative  and cooperative with normal attention span and concentration. No apparent delusions, illusions, hallucinations Does not seem sad or depressed.  Good eye contact quite talkative well groomed    Assessments/Plans  See Encounter view if individual problem A/Ps not visible See after visit summary for details of patient instuctions

## 2015-07-27 NOTE — Patient Instructions (Addendum)
Good to see you today!  Thanks for coming in.  Start exercising.  5 minutes of slow walking increasing over the next few weeks to 20-30 minutes of brisk walking. If you have any shortness of breath or chest pain or lightheadness stop and if it does not go away call 911.  If it does then call us or your cardiologist for an appointment before you restart  Make an appointment with a psychologist  Slowly cut down on the number of cigs.  Looking for a substitute   Follow up Sleep Apnea CPAP  Come back in 1 month

## 2015-07-27 NOTE — Assessment & Plan Note (Addendum)
Worsened His PHQ9 and GAD indicate a mixed picture.  He is not currently interested in taking medications.  Agrees to choose a counselor from his insurance booklet.  Start exercise.  Come back in 1 month

## 2015-08-13 DIAGNOSIS — Z0100 Encounter for examination of eyes and vision without abnormal findings: Secondary | ICD-10-CM | POA: Diagnosis not present

## 2015-08-14 DIAGNOSIS — M542 Cervicalgia: Secondary | ICD-10-CM | POA: Diagnosis not present

## 2015-08-18 ENCOUNTER — Other Ambulatory Visit: Payer: Self-pay | Admitting: Orthopaedic Surgery

## 2015-08-18 DIAGNOSIS — M542 Cervicalgia: Secondary | ICD-10-CM

## 2015-08-19 ENCOUNTER — Encounter: Payer: Self-pay | Admitting: Cardiology

## 2015-08-19 ENCOUNTER — Ambulatory Visit (INDEPENDENT_AMBULATORY_CARE_PROVIDER_SITE_OTHER): Payer: Commercial Managed Care - HMO | Admitting: Cardiology

## 2015-08-19 VITALS — BP 150/88 | HR 66 | Ht 68.0 in | Wt 294.6 lb

## 2015-08-19 DIAGNOSIS — H52209 Unspecified astigmatism, unspecified eye: Secondary | ICD-10-CM | POA: Diagnosis not present

## 2015-08-19 DIAGNOSIS — I2510 Atherosclerotic heart disease of native coronary artery without angina pectoris: Secondary | ICD-10-CM

## 2015-08-19 DIAGNOSIS — G4733 Obstructive sleep apnea (adult) (pediatric): Secondary | ICD-10-CM

## 2015-08-19 DIAGNOSIS — I480 Paroxysmal atrial fibrillation: Secondary | ICD-10-CM

## 2015-08-19 DIAGNOSIS — I4819 Other persistent atrial fibrillation: Secondary | ICD-10-CM | POA: Insufficient documentation

## 2015-08-19 DIAGNOSIS — E785 Hyperlipidemia, unspecified: Secondary | ICD-10-CM

## 2015-08-19 DIAGNOSIS — Z0100 Encounter for examination of eyes and vision without abnormal findings: Secondary | ICD-10-CM | POA: Diagnosis not present

## 2015-08-19 DIAGNOSIS — I10 Essential (primary) hypertension: Secondary | ICD-10-CM

## 2015-08-19 DIAGNOSIS — I422 Other hypertrophic cardiomyopathy: Secondary | ICD-10-CM | POA: Diagnosis not present

## 2015-08-19 DIAGNOSIS — H5213 Myopia, bilateral: Secondary | ICD-10-CM | POA: Diagnosis not present

## 2015-08-19 HISTORY — DX: Paroxysmal atrial fibrillation: I48.0

## 2015-08-19 HISTORY — DX: Atherosclerotic heart disease of native coronary artery without angina pectoris: I25.10

## 2015-08-19 NOTE — Patient Instructions (Signed)
Medication Instructions:  Your physician recommends that you continue on your current medications as directed. Please refer to the Current Medication list given to you today.   Labwork: None  Testing/Procedures: Your physician has requested that you have an echocardiogram. Echocardiography is a painless test that uses sound waves to create images of your heart. It provides your doctor with information about the size and shape of your heart and how well your heart's chambers and valves are working. This procedure takes approximately one hour. There are no restrictions for this procedure.   Your physician has recommended that you have a sleep study. This test records several body functions during sleep, including: brain activity, eye movement, oxygen and carbon dioxide blood levels, heart rate and rhythm, breathing rate and rhythm, the flow of air through your mouth and nose, snoring, body muscle movements, and chest and belly movement.  Follow-Up: Your physician wants you to follow-up in: 6 months with Dr. Mayford Knifeurner. You will receive a reminder letter in the mail two months in advance. If you don't receive a letter, please call our office to schedule the follow-up appointment.   Any Other Special Instructions Will Be Listed Below (If Applicable).     If you need a refill on your cardiac medications before your next appointment, please call your pharmacy.

## 2015-08-19 NOTE — Progress Notes (Signed)
Cardiology Office Note    Date:  08/19/2015   ID:  Kyle Peterson, DOB Jun 18, 1965, MRN [id number]  PCP:  Kyle Living, MD  Cardiologist:  Kyle Magic, MD   Chief Complaint  Patient presents with  . Sleep Apnea  . Hypertension  . Atrial Fibrillation    History of Present Illness:  Kyle Peterson is a 50 y.o. 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 Elberta Peterson, HTN, nonobstructive ASCAD, dyslipidemia, PAF and a history of polysubstance abuse with heroin, cocaine and ETOH for 25 years and has been clean for 10 years.  Prior to his operation for [facility], he had experienced an episode of syncope. He also had significant mitral regurgitation prior to his operation. Last echo 09/2014 showed low normal LVF with EF 50-55% with basal anteroseptum measuring 1.8cm with HK of the basal and mid anteroseptum and inferior walls with no LVOT obstruction.  Peak LVOT gradient was .  There was mild MR.   He is here to establish cardiac general cardiac care.  He had some sharp CP in June and went to the ER and workup was normal.  He has not had any further episodes.  He denies and SOB, DOE but gets very SOB at night awakening gasping for air.   He denies any LE edema, palpitations or syncope. He says that sometimes when he stretches his arms he will sometimes feel like he is going to pass out but has not had syncope.  He has a history of OSA and was seen by a sleep specialist at [facility] and was set up for HST but this was never done.  He complains of daytime sleepiness and snoring.  He has a history of mild restless legs syndrome.  He says that he has had problems falling asleep in the car if he is a passenger and will wake up gasping for air.      Past Medical History  Diagnosis Date  . Anxiety   . Sleep apnea   . Substance abuse   . Hypertension   . High cholesterol   . PAF (paroxysmal atrial fibrillation) (HCC) 08/19/2015  . CAD (coronary  artery disease), native coronary artery 08/19/2015    Past Surgical History  Procedure Laterality Date  . Myomectomy      Current Medications: Outpatient Prescriptions Prior to Visit  Medication Sig Dispense Refill  . diltiazem (CARTIA XT) 240 MG 24 hr capsule Take 1 capsule (240 mg total) by mouth daily. 15 capsule 0  . furosemide (LASIX) 20 MG tablet Take 1 tablet (20 mg total) by mouth daily. 15 tablet 0  . LORazepam (ATIVAN) 0.5 MG tablet Take 0.5 mg by mouth 2 (two) times daily as needed for anxiety. Reported on 07/27/2015    . losartan (COZAAR) 50 MG tablet Take 1 tablet (50 mg total) by mouth daily. 90 tablet 3  . metoprolol succinate (TOPROL-XL) 50 MG 24 hr tablet Take 1 tablet (50 mg total) by mouth daily. Take with or immediately following a meal. 15 tablet 0  . naproxen sodium (ANAPROX) 220 MG tablet Take 220 mg by mouth 2 (two) times daily as needed (for pain). Reported on 07/27/2015    . orphenadrine (NORFLEX) 100 MG tablet Take 1 tablet (100 mg total) by mouth 2 (two) times daily as needed for muscle spasms. 90 tablet 0  . rivaroxaban (XARELTO) 20 MG TABS tablet Take 1 tablet (20 mg total) by  mouth daily with supper. 30 tablet 0  . rivaroxaban (XARELTO) 20 MG TABS tablet Take 1 tablet (20 mg total) by mouth daily with supper. 30 tablet 11   No facility-administered medications prior to visit.     Allergies:   Ace inhibitors   Social History   Social History  . Marital Status: Divorced    Spouse Name: N/A  . Number of Children: N/A  . Years of Education: N/A   Social History Main Topics  . Smoking status: Current Every Day Smoker -- 0.50 packs/day    Types: Cigarettes  . Smokeless tobacco: None  . Alcohol Use: None  . Drug Use: None  . Sexual Activity: Not Asked   Other Topics Concern  . None   Social History Narrative   Recently moved to [facility]   Father is in [place].   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   ROS:   Please see the history of present illness.    ROS All other systems reviewed and are negative.   PHYSICAL EXAM:   VS:  BP 150/88 mmHg  Pulse 66  Ht  (1.727 m)  Wt 294 lb 9.6 oz (133.63 kg)  BMI 44.80 kg/m2   GEN: Well nourished, well developed, in no acute distress HEENT: normal Neck: no JVD, carotid bruits, or masses Cardiac: RRR; no murmurs, rubs, or gallops,no edema.  Intact distal pulses bilaterally.  Respiratory:  clear to auscultation bilaterally, normal work of breathing GI: soft, nontender, nondistended, + BS MS: no deformity or atrophy Skin: warm and dry, no rash Neuro:  Alert and Oriented x 3, Strength and sensation are intact Psych: euthymic mood, full affect  Wt Readings from Last 3 Encounters:  08/19/15 294 lb 9.6 oz (133.63 kg)  07/27/15 289 lb (131.09 kg)  07/18/15 280 lb (127.007 kg)      Studies/Labs Reviewed:   EKG:  EKG is not ordered today.    Recent Labs: 07/18/2015: ALT 19; B Natriuretic Peptide 133.3*; BUN 14; Creatinine, Ser 1.08; Hemoglobin 13.3; Magnesium 2.0; Platelets 264; Potassium 3.7; Sodium 137   Lipid Panel    Component Value Date/Time   CHOL 211* 06/10/2015 0924   TRIG 200* 06/10/2015 0924   HDL 38* 06/10/2015 0924   CHOLHDL 5.6* 06/10/2015 0924   VLDL 40* 06/10/2015 0924   LDLCALC 133* 06/10/2015 0924    Additional studies/ records that were reviewed today include:  OV notes from PCP and sleep MD    ASSESSMENT:    1. Obstructive sleep apnea   2. Hypertrophic cardiomyopathy (HCC)   3. Essential hypertension   4. PAF (paroxysmal atrial fibrillation) (HCC)   5. Hyperlipidemia   6. Coronary artery disease involving native coronary artery of native heart without angina pectoris      PLAN:  In order of  problems listed above:  1. OSA - this was diagnosed a year ago but has never gotten a CPAP device.  He is having significant excessive daytime sleepiness and awakening gasping for air.  I will set him up for a split night sleep study. 2. HOCM s/p myectomy and AICE - echo a year ago was stable.  He is followed by Dr. Elberta Peterson for AICD.  Continue  BB and CCB.  Repeat echo for stability.  3. HTN - BP stable on current medical regimen.  Continue BB/CCB and ARB 4. PAF - maintaining NSR.  Continue Xarelto and Cardizem/Toprol.  Recent BMEt/CBC stable.  5. Dyslipidemia - I will get a copy of his FLP from his PCP. 6. ASCAD - reported as nonobstructive. I [facility].  He is not on ASA due to NOAC.     Medication Adjustments/Labs and Tests Ordered: Current medicines are reviewed at length with the patient today.  Concerns regarding medicines are outlined above.  Medication changes, Labs and Tests ordered today are listed in the Patient Instructions below.  There are no Patient Instructions on file for this visit.   Signed, Kyle Magicraci Galen Russman, MD  08/19/2015 8:37 AM    [facility] [address], [place], [place]  [address] Phone: [phone]; Fax: [phone]

## 2015-08-20 ENCOUNTER — Ambulatory Visit (HOSPITAL_COMMUNITY): Payer: Commercial Managed Care - HMO | Attending: Cardiology

## 2015-08-20 ENCOUNTER — Other Ambulatory Visit: Payer: Self-pay

## 2015-08-20 DIAGNOSIS — I422 Other hypertrophic cardiomyopathy: Secondary | ICD-10-CM | POA: Insufficient documentation

## 2015-08-20 DIAGNOSIS — I517 Cardiomegaly: Secondary | ICD-10-CM | POA: Diagnosis not present

## 2015-08-20 DIAGNOSIS — I340 Nonrheumatic mitral (valve) insufficiency: Secondary | ICD-10-CM | POA: Insufficient documentation

## 2015-08-20 DIAGNOSIS — I358 Other nonrheumatic aortic valve disorders: Secondary | ICD-10-CM | POA: Diagnosis not present

## 2015-08-20 DIAGNOSIS — I480 Paroxysmal atrial fibrillation: Secondary | ICD-10-CM | POA: Insufficient documentation

## 2015-08-21 ENCOUNTER — Telehealth: Payer: Self-pay | Admitting: *Deleted

## 2015-08-21 NOTE — Telephone Encounter (Signed)
Called patient to ensure that he has received his Latitude home monitor and cellular adapter.  Patient reports he received the second piece today, so he will call Lat[employer] "Get Connected" for assistance in setting up the monitor.  Patient is appreciative of call and denies questions or concerns at this time.  Advised patient that we will plan to check his ICD automatically from home on 09/21/15 if the monitor is set up at this point.  Patient verbalizes understanding and denies additional questions at this time.

## 2015-08-21 NOTE — Telephone Encounter (Signed)
Follow Up:   He is hooked up and ready to transmits.

## 2015-08-21 NOTE — Telephone Encounter (Signed)
Advised patient that home monitor transmission was successfully received and that his ICD function is stable.  Patient is aware that remote transmission is scheduled for 09/21/15 and that his device clinic appt for that day has been canceled.  He is appreciative of call and denies questions or concerns at this time.

## 2015-08-26 ENCOUNTER — Ambulatory Visit (INDEPENDENT_AMBULATORY_CARE_PROVIDER_SITE_OTHER): Payer: Commercial Managed Care - HMO | Admitting: Family Medicine

## 2015-08-26 ENCOUNTER — Encounter: Payer: Self-pay | Admitting: Family Medicine

## 2015-08-26 DIAGNOSIS — I10 Essential (primary) hypertension: Secondary | ICD-10-CM | POA: Diagnosis not present

## 2015-08-26 DIAGNOSIS — F3177 Bipolar disorder, in partial remission, most recent episode mixed: Secondary | ICD-10-CM

## 2015-08-26 DIAGNOSIS — F17200 Nicotine dependence, unspecified, uncomplicated: Secondary | ICD-10-CM

## 2015-08-26 NOTE — Patient Instructions (Addendum)
Good to see you today!  Thanks for coming in.  Homework  Consider what would need to happen for you to stop smoking  Consider finding a counselor to help with your tendency to anxiety and depression  Check your blood pressure every so often at a store.  Goal is <140/90.  If it is regularly greater than this then call me  Continue regular every day or every other day exercise.  Notify your cardiologist if the sleep study is not set up within 2-3 weeks  Come back in 3 months to check on your progress or sooner if you wish to discuss stopping smoking strategies

## 2015-08-26 NOTE — Assessment & Plan Note (Signed)
Moderately stable.  He is not interested in seeing a psychiatrist.  Considering a counselor.

## 2015-08-26 NOTE — Assessment & Plan Note (Signed)
BP Readings from Last 3 Encounters:  08/26/15 (!) 167/100  08/19/15 (!) 150/88  07/27/15 118/65   Not at goal today but seems to fluctuate widely.  Continue to current medications which he relates he is taking and monitor.  May need to increase the losartan

## 2015-08-26 NOTE — Assessment & Plan Note (Signed)
Precontemplative.  We discussed possible circumstances that may make him consider stopping.  Offered follow up if desired

## 2015-08-26 NOTE — Progress Notes (Signed)
Subjective  Patient is presenting with the following illnesses  Anxiety - has not followed up with counselor.  Does not feel down or depressed but is anxious.  Is having some manic type symptoms - not sleeping feeling really good but denies any poor decisions.  Is exercising at [facility]  Has smoked pot several times since last visit which he thinks helps   HYPERTENSION Disease Monitoring  Home BP Monitoring (Severity) not checking does not have money for monitor Symptoms - Chest pain- no    Dyspnea- no Medications (Modifying factors) Compliance-  Took all this am. Lightheadedness-  no  Edema- no Timing - continuous  Duration - years ROS - See HPI  PMH Lab Review   Potassium  Date Value Ref Range Status  07/18/2015 3.7 3.5 - 5.1 mmol/L Final   Sodium  Date Value Ref Range Status  07/18/2015 137 135 - 145 mmol/L Final   Creat  Date Value Ref Range Status  06/10/2015 1.20 0.60 - 1.35 mg/dL Final   Creatinine, Ser  Date Value Ref Range Status  07/18/2015 1.08 0.61 - 1.24 mg/dL Final     TOBACCO USE Severity Smokes about 10 per day Modifying Factors - reasons they smoke - not sure why Timing - when do they smoke every 2-3 hours Duration - years Symptoms - Shortness of breath - no    Cough - no Is not interested in quitting now   Chief Complaint noted Review of Symptoms - see HPI PMH - Smoking status noted.     Objective Vital Signs reviewed Alert nad Psych:  Cognition and judgment appear intact. Alert, communicative  and cooperative with normal attention span and concentration. No apparent delusions, illusions, hallucinations.  Does move his legs rapidly at rest No edema     Assessments/Plans  See Encounter view if individual problem A/Ps not visible See after visit summary for details of patient instuctions

## 2015-08-27 ENCOUNTER — Other Ambulatory Visit: Payer: Self-pay | Admitting: Family Medicine

## 2015-08-27 ENCOUNTER — Other Ambulatory Visit: Payer: Self-pay | Admitting: *Deleted

## 2015-08-27 ENCOUNTER — Telehealth: Payer: Self-pay | Admitting: Family Medicine

## 2015-08-27 NOTE — Telephone Encounter (Signed)
Pt called and needs a referral for counseling. He would like someone in [place] that takes his insurance. He would like someone to give him a call after referral is sent in/approved. epp

## 2015-08-28 NOTE — Telephone Encounter (Signed)
Pls let him know he does not need a referral from me to see a counselor. He should look in his insurance information to know who is covered.   If he would like a recommendation he can call and leave the names of some available counselors and I can recommend some.  Thanks  LC

## 2015-08-28 NOTE — Telephone Encounter (Signed)
Spoke with patient and he is aware of this.  States that since he has [employer] they require a referral be placed online.  I advised patient to go ahead and find the counselor he prefer to see off the list he was given by his insurance and call me back with the name and npi and I can place referral online with [employer]. Jazmin Hartsell,CMA

## 2015-08-31 ENCOUNTER — Ambulatory Visit
Admission: RE | Admit: 2015-08-31 | Discharge: 2015-08-31 | Disposition: A | Discharge status: Home / Self Care | Payer: Commercial Managed Care - HMO | Source: Ambulatory Visit | Attending: Orthopaedic Surgery | Admitting: Orthopaedic Surgery

## 2015-08-31 DIAGNOSIS — M542 Cervicalgia: Secondary | ICD-10-CM

## 2015-08-31 DIAGNOSIS — M4322 Fusion of spine, cervical region: Secondary | ICD-10-CM | POA: Diagnosis not present

## 2015-09-01 ENCOUNTER — Telehealth: Payer: Self-pay

## 2015-09-01 DIAGNOSIS — I422 Other hypertrophic cardiomyopathy: Secondary | ICD-10-CM

## 2015-09-01 NOTE — Telephone Encounter (Signed)
Notes Recorded by Alois Cliche, LPN on 9/82/6415 at 8:45 AM EDT PT AWARE OF ECHO RESULTS ./CY   Repeat ECHO ordered to be scheduled in 1 year.

## 2015-09-01 NOTE — Telephone Encounter (Signed)
-----   Message from Quintella Reichert, MD sent at 08/20/2015  9:39 AM EDT ----- Echo showed normal LVF with increased stiffness of heart muscle with mildly calcified AV with no AS, borderline enlarged aortic root and mild MR - repeat echo in 1 year to followup on aortic root

## 2015-09-04 ENCOUNTER — Telehealth: Payer: Self-pay | Admitting: Cardiology

## 2015-09-04 NOTE — Telephone Encounter (Signed)
Records received via fax from [facility] to [place]

## 2015-09-08 DIAGNOSIS — M542 Cervicalgia: Secondary | ICD-10-CM | POA: Diagnosis not present

## 2015-09-16 DIAGNOSIS — R202 Paresthesia of skin: Secondary | ICD-10-CM | POA: Diagnosis not present

## 2015-09-19 ENCOUNTER — Encounter (INDEPENDENT_AMBULATORY_CARE_PROVIDER_SITE_OTHER): Payer: Self-pay

## 2015-09-21 ENCOUNTER — Ambulatory Visit (INDEPENDENT_AMBULATORY_CARE_PROVIDER_SITE_OTHER): Payer: Commercial Managed Care - HMO | Admitting: *Deleted

## 2015-09-21 DIAGNOSIS — I422 Other hypertrophic cardiomyopathy: Secondary | ICD-10-CM

## 2015-09-21 DIAGNOSIS — Z95810 Presence of automatic (implantable) cardiac defibrillator: Secondary | ICD-10-CM

## 2015-09-21 NOTE — Progress Notes (Signed)
Remote ICD transmission.   

## 2015-09-22 DIAGNOSIS — Z5321 Procedure and treatment not carried out due to patient leaving prior to being seen by health care provider: Secondary | ICD-10-CM | POA: Diagnosis not present

## 2015-09-22 DIAGNOSIS — F17210 Nicotine dependence, cigarettes, uncomplicated: Secondary | ICD-10-CM | POA: Diagnosis not present

## 2015-09-22 DIAGNOSIS — M7542 Impingement syndrome of left shoulder: Secondary | ICD-10-CM | POA: Diagnosis not present

## 2015-09-22 DIAGNOSIS — I10 Essential (primary) hypertension: Secondary | ICD-10-CM | POA: Diagnosis not present

## 2015-09-22 DIAGNOSIS — G8929 Other chronic pain: Secondary | ICD-10-CM | POA: Diagnosis not present

## 2015-09-22 DIAGNOSIS — I2510 Atherosclerotic heart disease of native coronary artery without angina pectoris: Secondary | ICD-10-CM | POA: Insufficient documentation

## 2015-09-22 DIAGNOSIS — M25512 Pain in left shoulder: Secondary | ICD-10-CM | POA: Diagnosis not present

## 2015-09-23 ENCOUNTER — Encounter (HOSPITAL_COMMUNITY): Payer: Self-pay | Admitting: Emergency Medicine

## 2015-09-23 ENCOUNTER — Emergency Department (HOSPITAL_COMMUNITY)
Admission: EM | Admit: 2015-09-23 | Discharge: 2015-09-23 | Disposition: A | Discharge status: Home / Self Care | Payer: Commercial Managed Care - HMO | Attending: Emergency Medicine | Admitting: Emergency Medicine

## 2015-09-23 ENCOUNTER — Encounter: Payer: Self-pay | Admitting: Cardiology

## 2015-09-23 ENCOUNTER — Emergency Department (HOSPITAL_COMMUNITY): Payer: Commercial Managed Care - HMO

## 2015-09-23 DIAGNOSIS — M25512 Pain in left shoulder: Secondary | ICD-10-CM | POA: Diagnosis not present

## 2015-09-23 MED ORDER — OXYCODONE-ACETAMINOPHEN 5-325 MG PO TABS
ORAL_TABLET | ORAL | Status: DC
Start: 2015-09-23 — End: 2015-09-23
  Filled 2015-09-23: qty 1

## 2015-09-23 MED ORDER — OXYCODONE-ACETAMINOPHEN 5-325 MG PO TABS
1.0000 | ORAL_TABLET | Freq: Once | ORAL | Status: AC
  Administered 2015-09-23: 1 via ORAL

## 2015-09-23 NOTE — ED Triage Notes (Signed)
Patient approached desk and inquired about wait time. Upon hearing an estimate, patient stated "I am going home". Encouraged patient to stay, but he declined. Visualized exiting department without difficulty.

## 2015-09-23 NOTE — ED Triage Notes (Addendum)
Pt. reports chronic left shoulder pain for 10 years worse these past several days , denies injury , pain increases with movement , palpation and changing positions . His orthopedic MD is Dr. Ophelia CharterYates , he received a Cortizone injection at his shoulder today .

## 2015-09-29 ENCOUNTER — Telehealth: Payer: Self-pay | Admitting: Cardiology

## 2015-09-29 NOTE — Telephone Encounter (Signed)
Will route this surgical request to Dr. Mayford Knifeurner and nurse to review and advise on, and follow-up with Dr. Ophelia CharterYates office with contact info provided. Will also send to pharmacy for medication clearance.

## 2015-09-29 NOTE — Telephone Encounter (Signed)
Pt takes Xarelto for afib, CHADS2 score is 1 (HTN), no history of stroke. Ok to hold Xarelto for 2 days prior to procedure. Clearance faxed to [phone].

## 2015-09-29 NOTE — Telephone Encounter (Signed)
New message    [name] a left carpotunnel release.    Request for surgical clearance:  1. What type of surgery is being performed? carpotunnel release   2. When is this surgery scheduled?no  Are there any medications that need to be held prior to surgery and how long? Xarelto wants to know what the dr would like to do about the medicine  3. Name of physician performing surgery?Dr. Ophelia CharterYates  What is your office phone and fax number? [phone]

## 2015-09-30 NOTE — Telephone Encounter (Signed)
CHADS2VASC score is actually 2 (HTN and mild CAD) but ok to hold for surgery and restart after operation

## 2015-10-01 LAB — CUP PACEART REMOTE DEVICE CHECK
Battery Remaining Longevity: 144 mo
Battery Remaining Percentage: 100 %
Brady Statistic RV Percent Paced: 0 %
HIGH POWER IMPEDANCE MEASURED VALUE: 73 Ohm
Implantable Lead Implant Date: 20160218
Implantable Lead Location: 753860
Implantable Lead Model: 292
Implantable Lead Serial Number: 358834
Lead Channel Pacing Threshold Amplitude: 0.6 V
Lead Channel Setting Pacing Pulse Width: 0.5 ms
Lead Channel Setting Sensing Sensitivity: 0.3 mV
MDC IDC MSMT LEADCHNL RV IMPEDANCE VALUE: 783 Ohm
MDC IDC MSMT LEADCHNL RV PACING THRESHOLD PULSEWIDTH: 0.5 ms
MDC IDC PG SERIAL: 202913
MDC IDC SESS DTM: 20170821063000
MDC IDC SET LEADCHNL RV PACING AMPLITUDE: 2 V

## 2015-10-01 NOTE — Telephone Encounter (Signed)
Yes we are using CHADs2Score so this needs to be updated

## 2015-10-15 ENCOUNTER — Encounter (HOSPITAL_COMMUNITY): Payer: Self-pay

## 2015-10-22 ENCOUNTER — Encounter (HOSPITAL_COMMUNITY): Payer: Self-pay | Admitting: Licensed Clinical Social Worker

## 2015-10-22 ENCOUNTER — Ambulatory Visit (HOSPITAL_COMMUNITY): Payer: Commercial Managed Care - HMO | Admitting: Licensed Clinical Social Worker

## 2015-10-22 ENCOUNTER — Ambulatory Visit (HOSPITAL_BASED_OUTPATIENT_CLINIC_OR_DEPARTMENT_OTHER): Payer: Commercial Managed Care - HMO | Attending: Cardiology | Admitting: Cardiology

## 2015-10-22 DIAGNOSIS — G4733 Obstructive sleep apnea (adult) (pediatric): Secondary | ICD-10-CM | POA: Diagnosis not present

## 2015-10-22 DIAGNOSIS — F319 Bipolar disorder, unspecified: Secondary | ICD-10-CM

## 2015-10-22 NOTE — Progress Notes (Signed)
Met with pt to discuss tx options. Pt is interested in Psych IOP program. Pt has [employer] and Medicaid. Pt is on disability. Pt was given a scholarship application due to the inabilty to pay any part of a medical bill. Pt will call [employer] to find out what % they will pay for IOP and then will fill out the scholarship application. PT will call the IOP case manager or myself when this is complete.

## 2015-10-27 ENCOUNTER — Encounter (HOSPITAL_BASED_OUTPATIENT_CLINIC_OR_DEPARTMENT_OTHER): Payer: Commercial Managed Care - HMO

## 2015-10-30 ENCOUNTER — Telehealth: Payer: Self-pay | Admitting: Family Medicine

## 2015-10-30 NOTE — Telephone Encounter (Signed)
LM for patient to call back and confirm this and also to let us know where his surgery will be performed.  [facility] but he isn't a patient there.  Will wait until hearing back from patient or from surgery center. Reesha Debes,CMA

## 2015-10-30 NOTE — Telephone Encounter (Signed)
[facility] Center called and would like the most recent office visit notes, sleep study, labs, and EKG faxed to them ASAP since the [patient is schedule for surgery on Monday, jw

## 2015-11-02 ENCOUNTER — Ambulatory Visit (HOSPITAL_COMMUNITY): Payer: Commercial Managed Care - HMO | Admitting: Psychiatry

## 2015-11-02 ENCOUNTER — Encounter (HOSPITAL_BASED_OUTPATIENT_CLINIC_OR_DEPARTMENT_OTHER): Payer: Self-pay | Admitting: Cardiology

## 2015-11-02 DIAGNOSIS — G4733 Obstructive sleep apnea (adult) (pediatric): Secondary | ICD-10-CM | POA: Insufficient documentation

## 2015-11-02 DIAGNOSIS — G5602 Carpal tunnel syndrome, left upper limb: Secondary | ICD-10-CM | POA: Diagnosis not present

## 2015-11-02 HISTORY — PX: CARPAL TUNNEL RELEASE: SHX101

## 2015-11-02 NOTE — Procedures (Signed)
   Patient Name: Elza RafterBlake, Jakim Study Date: 10/22/2015 Gender: Male D.O.B: Sep 20, 1965 Age (years): [age] Referring Provider: Armanda Magicraci Chalmer Zheng MD, ABSM Height (inches): 68 Interpreting Physician: Armanda Magicraci Jesi Jurgens MD, ABSM Weight (lbs): 290 RPSGT: Shelah LewandowskyGregory, Kenyon BMI: 44 MRN: [id number] Neck Size: 19.00  CLINICAL INFORMATION Sleep Study Type: NPSG Indication for sleep study: Excessive Daytime Sleepiness, Fatigue, Hypertension, Obesity, OSA, Re-Evaluation, Sleep walking/talking/parasomnias, Snoring, Witnessed Apneas Epworth Sleepiness Score: 15 Most recent polysomnogram dated 09/11/2014 revealed an AHI of 74.9/h.  SLEEP STUDY TECHNIQUE As per the AASM Manual for the Scoring of Sleep and Associated Events v2.3 (April 2016) with a hypopnea requiring 4% desaturations. The channels recorded and monitored were frontal, central and occipital EEG, electrooculogram (EOG), submentalis EMG (chin), nasal and oral airflow, thoracic and abdominal wall motion, anterior tibialis EMG, snore microphone, electrocardiogram, and pulse oximetry.  MEDICATIONS Patient's medications include: N/A. Medications self-administered by patient during sleep study : No sleep medicine administered.  SLEEP ARCHITECTURE The study was initiated at 10:28:30 PM and ended at 4:20:55 AM. Sleep onset time was 3.3 minutes and the sleep efficiency was 71.2%. The total sleep time was 251.0 minutes. Stage REM latency was N/A minutes. The patient spent 40.04% of the night in stage N1 sleep, 59.96% in stage N2 sleep, 0.00% in stage N3 and 0.00% in REM. Alpha intrusion was absent. Supine sleep was 48.01%.  RESPIRATORY PARAMETERS The overall apnea/hypopnea index (AHI) was 80.8 per hour. There were 268 total apneas, including 243 obstructive, 17 central and 8 mixed apneas. There were 70 hypopneas and 33 RERAs. The AHI during Stage REM sleep was N/A per hour. AHI while supine was 81.7 per hour. The mean oxygen saturation was 93.41%. The  minimum SpO2 during sleep was 81.00%. Moderate snoring was noted during this study.  CARDIAC DATA The 2 lead EKG demonstrated sinus rhythm. The mean heart rate was 53.89 beats per minute. Other EKG findings include: None.  LEG MOVEMENT DATA The total PLMS were 1 with a resulting PLMS index of 0.24. Associated arousal with leg movement index was 0.0 .  IMPRESSIONS - Severe obstructive sleep apnea occurred during this study (AHI = 80.8/h). - No significant central sleep apnea occurred during this study (CAI = 4.1/h). - Moderate oxygen desaturation was noted during this study (Min O2 = 81.00%). - The patient snored with Moderate snoring volume. - No cardiac abnormalities were noted during this study. - Clinically significant periodic limb movements did not occur during sleep. No significant associated arousals.  DIAGNOSIS - Obstructive Sleep Apnea (327.23 [G47.33 ICD-10]) - Nocturnal Hypoxemia (327.26 [G47.36 ICD-10])  RECOMMENDATIONS - Therapeutic CPAP titration to determine optimal pressure required to alleviate sleep disordered breathing.  The patient qualified for split night study and was tried on CPAP but did not tolerate mask.  Will need 2 week mask desensitization prior to coming back to lab for CPAP titration.  - Avoid alcohol, sedatives and other CNS depressants that may worsen sleep apnea and disrupt normal sleep architecture. - Sleep hygiene should be reviewed to assess factors that may improve sleep quality. - Weight management and regular exercise should be initiated or continued if appropriate.   Armanda Magicraci Connor Meacham Diplomate, American Board of Sleep Medicine  ELECTRONICALLY SIGNED ON:  11/02/2015, 7:37 PM [facility] PH: [phone]   FX: [phone] ACCREDITED BY THE AMERICAN ACADEMY OF SLEEP MEDICINE

## 2015-11-05 ENCOUNTER — Encounter (HOSPITAL_COMMUNITY): Payer: Self-pay | Admitting: Psychiatry

## 2015-11-05 ENCOUNTER — Other Ambulatory Visit (HOSPITAL_COMMUNITY): Payer: Commercial Managed Care - HMO | Attending: Psychiatry | Admitting: Psychiatry

## 2015-11-05 DIAGNOSIS — Z7901 Long term (current) use of anticoagulants: Secondary | ICD-10-CM | POA: Insufficient documentation

## 2015-11-05 DIAGNOSIS — F419 Anxiety disorder, unspecified: Secondary | ICD-10-CM | POA: Diagnosis not present

## 2015-11-05 DIAGNOSIS — F4312 Post-traumatic stress disorder, chronic: Secondary | ICD-10-CM | POA: Insufficient documentation

## 2015-11-05 DIAGNOSIS — G4733 Obstructive sleep apnea (adult) (pediatric): Secondary | ICD-10-CM | POA: Diagnosis not present

## 2015-11-05 DIAGNOSIS — I480 Paroxysmal atrial fibrillation: Secondary | ICD-10-CM | POA: Insufficient documentation

## 2015-11-05 DIAGNOSIS — Z818 Family history of other mental and behavioral disorders: Secondary | ICD-10-CM | POA: Diagnosis not present

## 2015-11-05 DIAGNOSIS — Z79899 Other long term (current) drug therapy: Secondary | ICD-10-CM | POA: Insufficient documentation

## 2015-11-05 DIAGNOSIS — E7800 Pure hypercholesterolemia, unspecified: Secondary | ICD-10-CM | POA: Diagnosis not present

## 2015-11-05 DIAGNOSIS — F331 Major depressive disorder, recurrent, moderate: Secondary | ICD-10-CM | POA: Insufficient documentation

## 2015-11-05 DIAGNOSIS — Z8249 Family history of ischemic heart disease and other diseases of the circulatory system: Secondary | ICD-10-CM | POA: Insufficient documentation

## 2015-11-05 DIAGNOSIS — Z811 Family history of alcohol abuse and dependence: Secondary | ICD-10-CM | POA: Insufficient documentation

## 2015-11-05 DIAGNOSIS — F17210 Nicotine dependence, cigarettes, uncomplicated: Secondary | ICD-10-CM | POA: Insufficient documentation

## 2015-11-05 DIAGNOSIS — I2510 Atherosclerotic heart disease of native coronary artery without angina pectoris: Secondary | ICD-10-CM | POA: Insufficient documentation

## 2015-11-05 DIAGNOSIS — I10 Essential (primary) hypertension: Secondary | ICD-10-CM | POA: Insufficient documentation

## 2015-11-05 DIAGNOSIS — Z888 Allergy status to other drugs, medicaments and biological substances status: Secondary | ICD-10-CM | POA: Insufficient documentation

## 2015-11-05 DIAGNOSIS — Z833 Family history of diabetes mellitus: Secondary | ICD-10-CM | POA: Diagnosis not present

## 2015-11-05 DIAGNOSIS — Z7682 Awaiting organ transplant status: Secondary | ICD-10-CM | POA: Insufficient documentation

## 2015-11-05 DIAGNOSIS — F332 Major depressive disorder, recurrent severe without psychotic features: Secondary | ICD-10-CM | POA: Diagnosis not present

## 2015-11-05 NOTE — Progress Notes (Signed)
    Daily Group Progress Note  Program: IOP  Group Time: 9:00-12:00  Participation Level: Active  Behavioral Response: Appropriate  Type of Therapy:  Group Therapy  Summary of Progress: Pt. Met with case manager and psychiatrist for most of first half of group. Pt. Presented as talkative, shared with the group his history of drug addiction and therapy. Pt. Reported that he was tired of being an introvert, but does not like people. Pt. Was challenged regarding the inconsistency in his perspective about relationships. Pt. Was responsive to feedback from the group and gave supportive feedback to others in the group. Pt. Was attentive during discussion about use of yoga and grounding series for management of depression.      Nancie Neas, LPC
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
## 2015-11-05 NOTE — Progress Notes (Signed)
Patient informed of information.  Stated verbal understanding. Ready to proceed with titration.    Orders placed, will get it scheduled and mail patient a letter to let him know the date of titration.

## 2015-11-05 NOTE — Addendum Note (Signed)
Addended by: Lenis DickinsonILLARD, BETHANY M on: 11/05/2015 03:38 PM   Modules accepted: Orders
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
## 2015-11-06 ENCOUNTER — Other Ambulatory Visit (HOSPITAL_COMMUNITY): Payer: Commercial Managed Care - HMO | Admitting: Psychiatry

## 2015-11-06 ENCOUNTER — Telehealth (HOSPITAL_COMMUNITY): Payer: Self-pay | Admitting: Psychiatry

## 2015-11-09 ENCOUNTER — Other Ambulatory Visit (HOSPITAL_COMMUNITY): Payer: Commercial Managed Care - HMO

## 2015-11-09 ENCOUNTER — Encounter: Payer: Self-pay | Admitting: *Deleted

## 2015-11-09 ENCOUNTER — Inpatient Hospital Stay (INDEPENDENT_AMBULATORY_CARE_PROVIDER_SITE_OTHER): Payer: Commercial Managed Care - HMO | Admitting: Orthopaedic Surgery

## 2015-11-09 DIAGNOSIS — G5602 Carpal tunnel syndrome, left upper limb: Secondary | ICD-10-CM

## 2015-11-10 ENCOUNTER — Other Ambulatory Visit (HOSPITAL_COMMUNITY): Payer: Commercial Managed Care - HMO

## 2015-11-11 ENCOUNTER — Other Ambulatory Visit (HOSPITAL_COMMUNITY): Payer: Commercial Managed Care - HMO

## 2015-11-11 ENCOUNTER — Emergency Department (HOSPITAL_COMMUNITY)
Admission: EM | Admit: 2015-11-11 | Discharge: 2015-11-11 | Disposition: A | Discharge status: Home / Self Care | Payer: Commercial Managed Care - HMO | Attending: Emergency Medicine | Admitting: Emergency Medicine

## 2015-11-11 ENCOUNTER — Encounter (HOSPITAL_COMMUNITY): Payer: Self-pay | Admitting: *Deleted

## 2015-11-11 DIAGNOSIS — I10 Essential (primary) hypertension: Secondary | ICD-10-CM | POA: Diagnosis not present

## 2015-11-11 DIAGNOSIS — Y828 Other medical devices associated with adverse incidents: Secondary | ICD-10-CM | POA: Insufficient documentation

## 2015-11-11 DIAGNOSIS — I2510 Atherosclerotic heart disease of native coronary artery without angina pectoris: Secondary | ICD-10-CM | POA: Insufficient documentation

## 2015-11-11 DIAGNOSIS — T814XXA Infection following a procedure, initial encounter: Secondary | ICD-10-CM | POA: Insufficient documentation

## 2015-11-11 DIAGNOSIS — T8140XA Infection following a procedure, unspecified, initial encounter: Secondary | ICD-10-CM

## 2015-11-11 DIAGNOSIS — Z7901 Long term (current) use of anticoagulants: Secondary | ICD-10-CM | POA: Diagnosis not present

## 2015-11-11 DIAGNOSIS — F17210 Nicotine dependence, cigarettes, uncomplicated: Secondary | ICD-10-CM | POA: Insufficient documentation

## 2015-11-11 LAB — CBC WITH DIFFERENTIAL/PLATELET
BASOS ABS: 0 10*3/uL (ref 0.0–0.1)
BASOS PCT: 0 %
EOS ABS: 0.2 10*3/uL (ref 0.0–0.7)
EOS PCT: 1 %
HCT: 41 % (ref 39.0–52.0)
Hemoglobin: 13.1 g/dL (ref 13.0–17.0)
Lymphocytes Relative: 12 %
Lymphs Abs: 1.6 10*3/uL (ref 0.7–4.0)
MCH: 24.4 pg — AB (ref 26.0–34.0)
MCHC: 32 g/dL (ref 30.0–36.0)
MCV: 76.4 fL — ABNORMAL LOW (ref 78.0–100.0)
Monocytes Absolute: 1.1 10*3/uL — ABNORMAL HIGH (ref 0.1–1.0)
Monocytes Relative: 8 %
Neutro Abs: 10.5 10*3/uL — ABNORMAL HIGH (ref 1.7–7.7)
Neutrophils Relative %: 79 %
PLATELETS: 218 10*3/uL (ref 150–400)
RBC: 5.37 MIL/uL (ref 4.22–5.81)
RDW: 18 % — ABNORMAL HIGH (ref 11.5–15.5)
WBC: 13.4 10*3/uL — AB (ref 4.0–10.5)

## 2015-11-11 LAB — I-STAT CHEM 8, ED
BUN: 14 mg/dL (ref 6–20)
CALCIUM ION: 1.03 mmol/L — AB (ref 1.15–1.40)
CHLORIDE: 103 mmol/L (ref 101–111)
Creatinine, Ser: 1.2 mg/dL (ref 0.61–1.24)
Glucose, Bld: 130 mg/dL — ABNORMAL HIGH (ref 65–99)
HEMATOCRIT: 44 % (ref 39.0–52.0)
Hemoglobin: 15 g/dL (ref 13.0–17.0)
POTASSIUM: 4.5 mmol/L (ref 3.5–5.1)
SODIUM: 140 mmol/L (ref 135–145)
TCO2: 27 mmol/L (ref 0–100)

## 2015-11-11 MED ORDER — DOXYCYCLINE HYCLATE 100 MG PO CAPS: 100.0000 mg | ORAL_CAPSULE | Freq: Two times a day (BID) | ORAL | 0 refills | Status: DC

## 2015-11-11 NOTE — ED Provider Notes (Signed)
[facility]-EMERGENCY DEPT Provider Note   CSN: [id number] Arrival date & time: 11/11/15  0854     History   Chief Complaint Chief Complaint  Patient presents with  . Post-op Problem    HPI Kyle Peterson is a 50 y.o. male.  HPI Patient presents with some bleeding from his left wrist carpal tunnel surgery site. Had the surgery done 9 days ago by Dr. Ophelia CharterYates. Follow-up in the office 2 days ago. Had some swelling at that time but Dr. Ophelia CharterYates is not worried about it. He is back on his Xarelto. States today it began to drain more. Has had some white drainage. No fevers. There's been some pain going up his forearm from the site. No numbness or weakness.   Past Medical History:  Diagnosis Date  . Anxiety   . CAD (coronary artery disease), native coronary artery 08/19/2015  . Depression   . High cholesterol   . Hypertension   . OSA (obstructive sleep apnea)    Severe with AHI 80/hr  . PAF (paroxysmal atrial fibrillation) (HCC) 08/19/2015  . PTSD (post-traumatic stress disorder)   . Substance abuse     Patient Active Problem List   Diagnosis Date Noted  . Depression, major, recurrent, moderate (HCC) 11/05/2015  . Chronic post-traumatic stress disorder (PTSD) 11/05/2015    Class: Chronic  . OSA (obstructive sleep apnea)   . PAF (paroxysmal atrial fibrillation) (HCC) 08/19/2015  . CAD (coronary artery disease), native coronary artery 08/19/2015  . Chronic back pain 06/17/2015  . Shoulder pain 06/11/2015  . Hyperlipidemia 06/10/2015  . Tobacco use disorder 05/28/2015  . Essential hypertension 05/27/2015  . Bipolar disorder (HCC) 05/27/2015  . 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 Obstructive sleep apnea 05/27/2015    Past Surgical History:  Procedure Laterality Date  . CARPAL TUNNEL RELEASE Left 11/02/2015  . MYOMECTOMY         Home Medications    Prior to Admission medications   Medication Sig Start Date End Date Taking? Authorizing Provider  acetaminophen-codeine  (TYLENOL #3) 300-30 MG tablet Take 2 tablets by mouth every 4 (four) hours as needed for moderate pain.   Yes Historical Provider, MD  CARTIA XT 240 MG 24 hr capsule TAKE 1 CAPSULE (240 MG TOTAL) BY MOUTH DAILY. 08/27/15  Yes Carney LivingMarshall L Chambliss, MD  furosemide (LASIX) 20 MG tablet Take 1 tablet (20 mg total) by mouth daily. 05/28/15  Yes Carney LivingMarshall L Chambliss, MD  losartan (COZAAR) 50 MG tablet Take 1 tablet (50 mg total) by mouth daily. 05/27/15  Yes Carney LivingMarshall L Chambliss, MD  metoprolol succinate (TOPROL-XL) 50 MG 24 hr tablet Take 1 tablet (50 mg total) by mouth daily. Take with or immediately following a meal. 05/28/15  Yes Carney LivingMarshall L Chambliss, MD  rivaroxaban (XARELTO) 20 MG TABS tablet Take 1 tablet (20 mg total) by mouth daily with supper. 06/18/15  Yes Will Jorja LoaMartin Camnitz, MD  doxycycline (VIBRAMYCIN) 100 MG capsule Take 1 capsule (100 mg total) by mouth 2 (two) times daily. 11/11/15   Benjiman CoreNathan Reida Hem, MD  orphenadrine (NORFLEX) 100 MG tablet Take 1 tablet (100 mg total) by mouth 2 (two) times daily as needed for muscle spasms. Patient not taking: Reported on 11/11/2015 05/27/15   Carney LivingMarshall L Chambliss, MD    Family History Family History  Problem Relation Age of Onset  . Diabetes Mother   . Hypertension Mother   . Diabetes Father   . Hypertension Father   . Heart disease Father   . Alcohol abuse  Father   . Alcohol abuse Brother   . Drug abuse Brother     Social History Social History  Substance Use Topics  . Smoking status: Current Every Day Smoker    Packs/day: 0.50    Types: Cigarettes  . Smokeless tobacco: Not on file  . Alcohol use No     Allergies   Ace inhibitors   Review of Systems Review of Systems  Constitutional: Negative for appetite change.  Respiratory: Negative for cough.   Gastrointestinal: Negative for abdominal pain.  Musculoskeletal: Positive for joint swelling.  Skin: Positive for wound.  Neurological: Negative for weakness and numbness.    Hematological: Bruises/bleeds easily.     Physical Exam Updated Vital Signs BP 129/88   Pulse 65   Temp 98 F (36.7 C) (Oral)   Resp 24   SpO2 100%   Physical Exam  Constitutional: He appears well-developed.  HENT:  Head: Atraumatic.  Musculoskeletal:  Left hand and wrist has healing wound from carpal tunnel surgery on the proximal aspect of this wound there is a approximately 3 mm area that has some bloody purulent drainage. Worse with flexion at the wrist. Neurovascularly intact in hand. Some swelling of the pulmonary hand.  Skin: Skin is warm.     ED Treatments / Results  Labs (all labs ordered are listed, but only abnormal results are displayed) Labs Reviewed  CBC WITH DIFFERENTIAL/PLATELET - Abnormal; Notable for the following:       Result Value   WBC 13.4 (*)    MCV 76.4 (*)    MCH 24.4 (*)    RDW 18.0 (*)    Neutro Abs 10.5 (*)    Monocytes Absolute 1.1 (*)    All other components within normal limits  I-STAT CHEM 8, ED - Abnormal; Notable for the following:    Glucose, Bld 130 (*)    Calcium, Ion 1.03 (*)    All other components within normal limits  AEROBIC CULTURE (SUPERFICIAL SPECIMEN)    EKG  EKG Interpretation None       Radiology No results found.  Procedures Procedures (including critical care time)  Medications Ordered in ED Medications - No data to display   Initial Impression / Assessment and Plan / ED Course  I have reviewed the triage vital signs and the nursing notes.  Pertinent labs & imaging results that were available during my care of the patient were reviewed by me and considered in my medical decision making (see chart for details).  Clinical Course    Patient with possible postoperative infection after carpal tunnel surgery. Discussed with Dr. Ophelia Charter, do the surgery. We'll give doxycycline. White count mildly elevated. Will discharge home. Will follow-up in the next couple days in the office.  Final Clinical  Impressions(s) / ED Diagnoses   Final diagnoses:  Postoperative infection, initial encounter    New Prescriptions New Prescriptions   DOXYCYCLINE (VIBRAMYCIN) 100 MG CAPSULE    Take 1 capsule (100 mg total) by mouth 2 (two) times daily.     Benjiman Core, MD 11/11/15 [phone]

## 2015-11-11 NOTE — ED Triage Notes (Signed)
Pt reports left carpal tunnel surgery a week ago on Monday. Having swelling site, this am had bleeding from incision.

## 2015-11-11 NOTE — ED Notes (Signed)
papers and prescriptions reviewed and pt. Verbalizes understanding and intent to follow up

## 2015-11-12 ENCOUNTER — Other Ambulatory Visit (HOSPITAL_COMMUNITY): Payer: Commercial Managed Care - HMO

## 2015-11-12 ENCOUNTER — Inpatient Hospital Stay (INDEPENDENT_AMBULATORY_CARE_PROVIDER_SITE_OTHER): Payer: Commercial Managed Care - HMO | Admitting: Specialist

## 2015-11-13 ENCOUNTER — Other Ambulatory Visit (HOSPITAL_COMMUNITY): Payer: Commercial Managed Care - HMO

## 2015-11-13 LAB — AEROBIC CULTURE  (SUPERFICIAL SPECIMEN)

## 2015-11-13 LAB — AEROBIC CULTURE W GRAM STAIN (SUPERFICIAL SPECIMEN)

## 2015-11-14 ENCOUNTER — Telehealth (HOSPITAL_BASED_OUTPATIENT_CLINIC_OR_DEPARTMENT_OTHER): Payer: Self-pay

## 2015-11-14 NOTE — Telephone Encounter (Signed)
Post ED Visit - Positive Culture Follow-up  Culture report reviewed by antimicrobial stewardship pharmacist:  []  Enzo BiNathan Batchelder, Pharm.D. []  Celedonio MiyamotoJeremy Frens, Pharm.D., BCPS []  Garvin FilaMike Maccia, Pharm.D. []  Georgina PillionElizabeth Martin, Pharm.D., BCPS []  Stirling CityMinh Pham, [address]Pharm[address]D., BCPS, AAHIVP []  Estella HuskMichelle Turner, Pharm.D., BCPS, AAHIVP []  Tennis Mustassie Stewart, Pharm.D. []  Sherle Poeob Vincent, [address]Ph[address] Agapito GamesAlison Masters Pharm D Positive Aerobic superficial culture Treated with Doxycycline, organism sensitive to the same and no further patient follow-up is required at this time.  Jerry CarasCullom, Breena Bevacqua Burnett 11/14/2015, 12:14 PM

## 2015-11-16 ENCOUNTER — Other Ambulatory Visit (HOSPITAL_COMMUNITY): Payer: Commercial Managed Care - HMO

## 2015-11-17 ENCOUNTER — Other Ambulatory Visit (HOSPITAL_COMMUNITY): Payer: Commercial Managed Care - HMO

## 2015-11-17 ENCOUNTER — Ambulatory Visit (INDEPENDENT_AMBULATORY_CARE_PROVIDER_SITE_OTHER): Payer: Commercial Managed Care - HMO | Admitting: Orthopaedic Surgery

## 2015-11-17 DIAGNOSIS — R202 Paresthesia of skin: Secondary | ICD-10-CM

## 2015-11-18 ENCOUNTER — Other Ambulatory Visit (HOSPITAL_COMMUNITY): Payer: Commercial Managed Care - HMO

## 2015-11-19 ENCOUNTER — Other Ambulatory Visit (HOSPITAL_COMMUNITY): Payer: Commercial Managed Care - HMO

## 2015-11-20 ENCOUNTER — Other Ambulatory Visit (HOSPITAL_COMMUNITY): Payer: Commercial Managed Care - HMO

## 2015-11-23 ENCOUNTER — Other Ambulatory Visit (HOSPITAL_COMMUNITY): Payer: Commercial Managed Care - HMO

## 2015-11-24 ENCOUNTER — Encounter (HOSPITAL_COMMUNITY): Payer: Self-pay

## 2015-11-24 ENCOUNTER — Other Ambulatory Visit (HOSPITAL_COMMUNITY): Payer: Commercial Managed Care - HMO | Admitting: Psychiatry

## 2015-11-24 ENCOUNTER — Ambulatory Visit (HOSPITAL_BASED_OUTPATIENT_CLINIC_OR_DEPARTMENT_OTHER): Payer: Commercial Managed Care - HMO | Attending: Cardiology | Admitting: Radiology

## 2015-11-24 NOTE — Patient Instructions (Signed)
D:  Pt requesting discharge today due to conflict with appointments.  A:  D/C today.  F/U with PCP and Idalia NeedleBeth MacKenzie, LCAS.  Encouraged support groups.  Recommended the Aftercare Group with Idalia NeedleBeth MacKenzie, LCAS.  R:  Pt receptive.

## 2015-11-24 NOTE — Progress Notes (Signed)
Patient ID: Kyle Peterson, male   DOB: 1965-10-29, 50 y.o.   MRN: [id number] IOP Discharge note  Mr Kyle Peterson only attended the group the day of admission and did not return saying "It's too much for me"  Consequently nothing to report at discharge.    Diagnosis remains Major depression, recurrent severe without psychotic features.  The case manager called him to check on him and to make sure he had follow up appointments with his outpatient therapist and psychiatrist  Discharge today as he did not attend the program

## 2015-11-24 NOTE — Progress Notes (Signed)
Mariana ArnJon Andrzejewski is a 50 y.o. , divorced, disabled, Caucasian male; who was referred per therapist Idalia Needle([name], LCAS), treatment for worsening on and off depressive symptoms for yrs.   He is on disability and just recently had  carpal tunnel surgery (11-02-15).  Stressors:  Health Issues:  Heart Disease.  Had heart surgery two yrs ago and is on the list for a heart transplant.  Also, has sleep apnea.  2)  Relationship issues:  GF is a drug addict.  "I thought I could save her, but finally realized that I can't."  3)  Financial Strain due to medical costs.  4)  No support system; except for his mother.  "My mom is my best friend."  Has had an extensive long drug hx.  Has been admitted several times to rehabs.  Hx of seeing therapists and a psychiatrist.  Hx of self injurious behaviors (cutting).  Admits to prior suicide attempts by way of hanging and OD's.  Family Hx:  deceased father (ETOH); brother (ETOH and drugs); maternal uncle (drugs).                          Pt only attended one day and didn't return due to surgery complications (carpel tunnel).  Pt had had surgery the previous week before starting MH-IOP.   Spoke to pt yesterday and he is requesting to be discharged due to multiple appointments.  He thanked Clinical research associatewriter for holding his spot for this long.  Denies SI/HI or A/V hallucinations.  A:  D/C today.  F/U with PCP and Idalia NeedleBeth MacKenzie, LCAS.  Encouraged support groups.  Recommended the Aftercare Group with Beth.  [facility].  R:  Pt receptive.    Chestine SporeLARK, RITA, M.Ed, CNA

## 2015-11-25 ENCOUNTER — Other Ambulatory Visit (HOSPITAL_COMMUNITY): Payer: Commercial Managed Care - HMO

## 2015-11-26 ENCOUNTER — Other Ambulatory Visit (HOSPITAL_COMMUNITY): Payer: Commercial Managed Care - HMO

## 2015-12-01 ENCOUNTER — Encounter (INDEPENDENT_AMBULATORY_CARE_PROVIDER_SITE_OTHER): Payer: Self-pay | Admitting: Orthopaedic Surgery

## 2015-12-01 ENCOUNTER — Ambulatory Visit (INDEPENDENT_AMBULATORY_CARE_PROVIDER_SITE_OTHER): Payer: Commercial Managed Care - HMO | Admitting: Orthopaedic Surgery

## 2015-12-01 VITALS — BP 124/83 | HR 78 | Ht 68.0 in | Wt 290.0 lb

## 2015-12-01 DIAGNOSIS — G5602 Carpal tunnel syndrome, left upper limb: Secondary | ICD-10-CM

## 2015-12-01 DIAGNOSIS — G8929 Other chronic pain: Secondary | ICD-10-CM

## 2015-12-01 DIAGNOSIS — M25512 Pain in left shoulder: Secondary | ICD-10-CM

## 2015-12-01 NOTE — Addendum Note (Signed)
Addended by: Rogers SeedsYEATTS, Johnnathan Hagemeister M on: 12/01/2015 05:51 PM   Modules accepted: Orders

## 2015-12-01 NOTE — Progress Notes (Signed)
Office Visit Note   Patient: Mariana ArnJon Bergeman           Date of Birth: [birth year]           MRN: [id number] Visit Date: 12/01/2015              Requested by: Carney LivingMarshall L Chambliss, MD [address] [place], [place] [address] PCP: Carney LivingMarshall L Chambliss, MD   Assessment & Plan: Visit Diagnoses:  1. Carpal tunnel syndrome, left upper limb   2. Chronic left shoulder pain     Plan: Carpal tunnel incision is healed nicely. He can apply soap water lotion. He notices some residual weakness present pain and numbness has significantly improved. He continues to have polyps with his left shoulder without stress reaching and overhead activities. He's had several injections subacromially which only gave temporary relief. Patient has a pacemaker and has weakness with supraspinatus testing. No high riding of the head on plain radiographs. Positive impingement. Exam findings would suggest that he may have a rotator cuff tear. We'll proceed with a left shoulder arthrogram CT scan to rule out rotator cuff tear supraspinatus and office follow-up after scan.  Follow-Up Instructions: Return if symptoms worsen or fail to improve.   Orders:  No orders of the defined types were placed in this encounter.  No orders of the defined types were placed in this encounter.     Procedures: No procedures performed   Clinical Data: No additional findings.   Subjective: Chief Complaint  Patient presents with  . Left Wrist - Follow-up    Patient is status post left carpal tunnel release on 11/02/2015.  He developed community acquired MRSA post op. Patient is doing well. He states that he has some itching around the incision and every once in a while he will get a sharp twinge of pain just at the end of the proximal end of the incision.  He can push on this area, and the pain goes away. He still has a couple days left of the Doxycycline.  He notices that he still does not have the strength in his hand yet, but the  numbness and tingling are gone.  He is ready to be able to play his guitar.     Review of Systems updated and unchanged other than as mentioned here, 14 point ROS undated.    Objective: Vital Signs: BP 124/83   Pulse 78   Ht 5\' 8"  (1.727 m)   Wt 290 lb (131.5 kg)   BMI 44.09 kg/m   Physical Exam  Constitutional: He is oriented to person, place, and time. He appears well-developed and well-nourished.  HENT:  Head: Normocephalic and atraumatic.  Eyes: EOM are normal. Pupils are equal, round, and reactive to light.  Neck: No tracheal deviation present. No thyromegaly present.  Cardiovascular: Normal rate.   Left anterior chest pacemaker/defibrillator  Pulmonary/Chest: Effort normal. He has no wheezes.  Abdominal: Soft. Bowel sounds are normal.  Neurological: He is alert and oriented to person, place, and time.  Skin: Skin is warm and dry. Capillary refill takes less than 2 seconds.  Psychiatric: He has a normal mood and affect. His behavior is normal. Judgment and thought content normal.    Ortho Exam well-healed left carpal tunnel incision. There is no drainage complete healing of the incision. Patient had combinations from the blood thinner was proximal bleeding from the incision which is now completely healed. Patient has positive drop arm test on the left positive impingement left mild  on the right. No subluxation before meals joint is minimally tender negative crossarm adduction test mild brachioplexus tenderness. Well-healed cervical incision from previous C5-6 fusion. (CT scan showed spondylitic changes C6-7 done recently) no hyperreflexia lower extremities normal heel toe gait.  Specialty Comments:  No specialty comments available.  Imaging: No results found.   PMFS History: Patient Active Problem List   Diagnosis Date Noted  . Depression, major, recurrent, moderate (HCC) 11/05/2015  . Chronic post-traumatic stress disorder (PTSD) 11/05/2015    Class: Chronic  . OSA  (obstructive sleep apnea)   . PAF (paroxysmal atrial fibrillation) (HCC) 08/19/2015  . CAD (coronary artery disease), native coronary artery 08/19/2015  . Chronic back pain 06/17/2015  . Shoulder pain 06/11/2015  . Hyperlipidemia 06/10/2015  . Tobacco use disorder 05/28/2015  . Essential hypertension 05/27/2015  . Bipolar disorder (HCC) 05/27/2015  . 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 Obstructive sleep apnea 05/27/2015   Past Medical History:  Diagnosis Date  . Anxiety   . CAD (coronary artery disease), native coronary artery 08/19/2015  . Depression   . High cholesterol   . Hypertension   . OSA (obstructive sleep apnea)    Severe with AHI 80/hr  . PAF (paroxysmal atrial fibrillation) (HCC) 08/19/2015  . PTSD (post-traumatic stress disorder)   . Substance abuse     Family History  Problem Relation Age of Onset  . Diabetes Mother   . Hypertension Mother   . Diabetes Father   . Hypertension Father   . Heart disease Father   . Alcohol abuse Father   . Alcohol abuse Brother   . Drug abuse Brother     Past Surgical History:  Procedure Laterality Date  . CARPAL TUNNEL RELEASE Left 11/02/2015  . MYOMECTOMY     Social History   Occupational History  . Not on file.   Social History Main Topics  . Smoking status: Current Every Day Smoker    Packs/day: 0.50    Types: Cigarettes  . Smokeless tobacco: Never Used  . Alcohol use No  . Drug use:     Types: Marijuana  . Sexual activity: Not Currently

## 2015-12-04 ENCOUNTER — Telehealth: Payer: Self-pay | Admitting: Cardiology

## 2015-12-04 NOTE — Telephone Encounter (Signed)
[facility] to [facility] to clarify test to be done, but hung up after being on hold for several minutes. Below are Dr. Ophelia CharterYates' notes. The test is not yet scheduled. They are looking for clearance to hold Xarelto.   Assessment & Plan: Visit Diagnoses:  1. Carpal tunnel syndrome, left upper limb   2. Chronic left shoulder pain     Plan: Carpal tunnel incision is healed nicely. He can apply soap water lotion. He notices some residual weakness present pain and numbness has significantly improved. He continues to have polyps with his left shoulder without stress reaching and overhead activities. He's had several injections subacromially which only gave temporary relief. Patient has a pacemaker and has weakness with supraspinatus testing. No high riding of the head on plain radiographs. Positive impingement. Exam findings would suggest that he may have a rotator cuff tear. We'll proceed with a left shoulder arthrogram CT scan to rule out rotator cuff tear supraspinatus and office follow-up after scan.

## 2015-12-04 NOTE — Telephone Encounter (Signed)
Pt takes Xarelto for afib with CHADS2 score of 1 (HTN). Ok to hold Xarelto for 1 day prior to procedure. Clearance faxed to [phone].

## 2015-12-04 NOTE — Telephone Encounter (Signed)
Request for surgical clearance:  1. What type of surgery is being performed? Ct Autogram  2. When is this surgery scheduled?Not scheduled yet   3. Are there any medications that need to be held prior to surgery and how long?Blood thinners for one day   4. Name of physician performing surgery? Referring from Dr.Mark Ophelia CharterYates but doesn't know yet who will be preforming the injection   5. What is your office phone and fax number? [phone] opt 2/ fax [phone]

## 2015-12-09 ENCOUNTER — Ambulatory Visit (INDEPENDENT_AMBULATORY_CARE_PROVIDER_SITE_OTHER): Payer: Commercial Managed Care - HMO | Admitting: Orthopaedic Surgery

## 2015-12-10 ENCOUNTER — Ambulatory Visit (INDEPENDENT_AMBULATORY_CARE_PROVIDER_SITE_OTHER): Payer: Commercial Managed Care - HMO | Admitting: Licensed Clinical Social Worker

## 2015-12-10 DIAGNOSIS — F331 Major depressive disorder, recurrent, moderate: Secondary | ICD-10-CM | POA: Diagnosis not present

## 2015-12-14 ENCOUNTER — Encounter (HOSPITAL_COMMUNITY): Payer: Self-pay | Admitting: Licensed Clinical Social Worker

## 2015-12-14 NOTE — Progress Notes (Signed)
Kyle Peterson is a 50 y.o. , divorced, disabled, Caucasian male; who was referred back to me from psych IOP. Pt was discharged from that program due to his continuous doctor appts who interferred with the group times. Pt presented anxious and somewhat depressed. He sadly reported his health issues, lack of sleep, no $, being on disability. Pt is lonely and reports his mother who lives in [place] is his best friend, however, he says he is not his mother's best friend sadly. Pt has a contentious relationship with his father who lives in [place]. Pt has a hx of: disability and just recently had  carpal tunnel surgery (11-02-15).  Stressors:  Health Issues:  Heart Disease.  Had heart surgery two yrs ago and is on the list for a heart transplant.  Also, has sleep apnea.    Financial Strain due to medical costs.    No support system; except for his mother.   Has had an extensive long drug hx.  Has been admitted several times to rehabs.  Hx of seeing therapists and a psychiatrist.  Hx of self injurious behaviors (cutting).  Admits to prior suicide attempts by way of hanging and OD's.  Pt agreed that he would like to return to OP therapy to work on his stressors. Referred pt to Vocational Rehabilitation. Suggested pt look for volunteer work and to begin journaling.  2:15pm- 3pm     Ronn Smolinsky S, LCAS,

## 2015-12-17 ENCOUNTER — Ambulatory Visit
Admission: RE | Admit: 2015-12-17 | Discharge: 2015-12-17 | Disposition: A | Discharge status: Home / Self Care | Payer: Commercial Managed Care - HMO | Source: Ambulatory Visit | Attending: Orthopaedic Surgery | Admitting: Orthopaedic Surgery

## 2015-12-17 ENCOUNTER — Other Ambulatory Visit (INDEPENDENT_AMBULATORY_CARE_PROVIDER_SITE_OTHER): Payer: Self-pay | Admitting: Orthopaedic Surgery

## 2015-12-17 DIAGNOSIS — M25512 Pain in left shoulder: Principal | ICD-10-CM

## 2015-12-17 DIAGNOSIS — G8929 Other chronic pain: Secondary | ICD-10-CM

## 2015-12-17 DIAGNOSIS — M75122 Complete rotator cuff tear or rupture of left shoulder, not specified as traumatic: Secondary | ICD-10-CM | POA: Diagnosis not present

## 2015-12-17 MED ORDER — IOPAMIDOL (ISOVUE-M 200) INJECTION 41%
15.0000 mL | Freq: Once | INTRAMUSCULAR | Status: AC
  Administered 2015-12-17: 15 mL via INTRA_ARTICULAR

## 2015-12-21 ENCOUNTER — Ambulatory Visit (INDEPENDENT_AMBULATORY_CARE_PROVIDER_SITE_OTHER): Payer: Commercial Managed Care - HMO | Admitting: *Deleted

## 2015-12-21 DIAGNOSIS — I422 Other hypertrophic cardiomyopathy: Secondary | ICD-10-CM | POA: Diagnosis not present

## 2015-12-21 NOTE — Progress Notes (Signed)
Remote ICD transmission.   

## 2015-12-23 ENCOUNTER — Encounter: Payer: Self-pay | Admitting: Cardiology

## 2015-12-28 ENCOUNTER — Encounter (INDEPENDENT_AMBULATORY_CARE_PROVIDER_SITE_OTHER): Payer: Self-pay | Admitting: Orthopaedic Surgery

## 2015-12-28 ENCOUNTER — Ambulatory Visit (HOSPITAL_BASED_OUTPATIENT_CLINIC_OR_DEPARTMENT_OTHER): Payer: Commercial Managed Care - HMO | Attending: Cardiology | Admitting: Cardiology

## 2015-12-28 ENCOUNTER — Ambulatory Visit (INDEPENDENT_AMBULATORY_CARE_PROVIDER_SITE_OTHER): Payer: Commercial Managed Care - HMO | Admitting: *Deleted

## 2015-12-28 ENCOUNTER — Ambulatory Visit (INDEPENDENT_AMBULATORY_CARE_PROVIDER_SITE_OTHER): Payer: Commercial Managed Care - HMO | Admitting: Orthopaedic Surgery

## 2015-12-28 VITALS — BP 150/98 | HR 69 | Ht 68.0 in | Wt 290.0 lb

## 2015-12-28 DIAGNOSIS — G4733 Obstructive sleep apnea (adult) (pediatric): Secondary | ICD-10-CM

## 2015-12-28 DIAGNOSIS — M75122 Complete rotator cuff tear or rupture of left shoulder, not specified as traumatic: Secondary | ICD-10-CM | POA: Diagnosis not present

## 2015-12-28 DIAGNOSIS — Z23 Encounter for immunization: Secondary | ICD-10-CM | POA: Diagnosis not present

## 2015-12-28 NOTE — Progress Notes (Signed)
Office Visit Note   Patient: Kyle Peterson           Date of Birth: 27-Jun-1965           MRN: [id number] Visit Date: 12/28/2015              Requested by: Carney LivingMarshall L Chambliss, MD [address] [place], [place] [address] PCP: Carney LivingMarshall L Chambliss, MD   Assessment & Plan: Visit Diagnoses:  1. 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 Asymptomatic bothersome on daily basis Estrich reaching. He's been through an excised program if that persists symptoms and symptoms of the present for greater than 6 months. Plan would be shoulder arthroscopy and rotator cuff repair. Patient will need cardiology clearance preoperatively and does have an implantable defibrillator which he states is not a pacemaker.  Follow-Up Instructions: No Follow-up on file.   Orders:  No orders of the defined types were placed in this encounter.  No orders of the defined types were placed in this encounter.     Procedures: No procedures performed   Clinical Data: No additional findings.   Subjective: Chief Complaint  Patient presents with  . Left Shoulder - Follow-up, Pain    HPI  Review of Systems  Constitutional: Negative for chills and diaphoresis.  HENT: Negative for ear discharge, ear pain and nosebleeds.   Eyes: Negative for discharge and visual disturbance.  Respiratory: Negative for cough, choking and shortness of breath.   Cardiovascular: Negative for chest pain and palpitations.       Implantable defibrillator left anterior chest wall  Gastrointestinal: Negative for abdominal distention and abdominal pain.  Endocrine: Negative for cold intolerance and heat intolerance.  Genitourinary: Negative for flank pain and hematuria.  Skin: Negative for rash and wound.  Neurological: Negative for seizures and speech difficulty.  Hematological: Negative for adenopathy. Does not bruise/bleed easily.    Psychiatric/Behavioral: Negative for agitation and suicidal ideas.     Objective: Vital Signs: BP (!) 150/98   Pulse 69   Ht 5\' 8"  (1.727 m)   Wt 290 lb (131.5 kg)   BMI 44.09 kg/m   Physical Exam  Constitutional: He is oriented to person, place, and time. He appears well-developed and well-nourished.  HENT:  Head: Normocephalic and atraumatic.  Eyes: EOM are normal. Pupils are equal, round, and reactive to light.  Neck: No tracheal deviation present. No thyromegaly present.  Cardiovascular: Normal rate.   Patient has a defibrillator left anterior chest wall.  Pulmonary/Chest: Effort normal. He has no wheezes.  Abdominal: Soft. Bowel sounds are normal.  Musculoskeletal:  Left shoulder positive drop arm test. Positive impingement positive Neer. No distal migration long head biceps tendon. Well-healed carpal tunnel incision from last month  Neurological: He is alert and oriented to person, place, and time.  Skin: Skin is warm and dry. Capillary refill takes less than 2 seconds.  Psychiatric: He has a normal mood and affect. His behavior is normal. Judgment and thought content normal.    Ortho Exam no supraspinatus fossa atrophy. Negative liftoff test. Girtha RmSubscap is strong. Normal heel-to-toe gait. Positive drop arm test left, negative on the right Specialty Comments:  No specialty comments available.  Imaging: No results found.  Final [99] 12/17/2015 3:44 PM 12/17/2015 4:14 PM  PACS Images   Show images for CT SHOULDER LEFT W CONTRAST  Study Result   CLINICAL  DATA:  Left shoulder pain and popping. Remote history of injury. No prior surgery.  EXAM: CT ARTHROGRAPHY OF THE left shoulder  TECHNIQUE: Multidetector CT imaging was performed following the standard protocol after injection of dilute contrast into the joint.  COMPARISON:  Left shoulder radiographs 09/23/2015  FINDINGS: No acute bony findings. No fracture or AVN. The glenohumeral joint is maintained.  The [facility] joint is intact. No degenerative changes. The acromion is type 1- 2 in shape.  There is a full-thickness rotator cuff tear with contrast extravasation into the subacromial/ subdeltoid bursa. The tear is most likely involving the anterior attachment region of the supraspinous tendon and may also involve some upper fibers of the subscapularis.  The long head biceps tendon is intact. The glenoid labrum are intact.  The visualized left lung is clear.  IMPRESSION: 1. Full-thickness rotator cuff tear likely involving the anterior attachment region of the supraspinous tendon and some of the upper fibers of the subscapularis tendon. 2. Intact long head biceps tendon and glenoid labrum. 3. No findings for bony impingement.   Electronically Signed   By: Rudie MeyerP.  Gallerani M.D.   On: 12/17/2015 17:33     PMFS History: Patient Active Problem List   Diagnosis Date Noted  . Depression, major, recurrent, moderate (HCC) 11/05/2015  . Chronic post-traumatic stress disorder (PTSD) 11/05/2015    Class: Chronic  . OSA (obstructive sleep apnea)   . PAF (paroxysmal atrial fibrillation) (HCC) 08/19/2015  . CAD (coronary artery disease), native coronary artery 08/19/2015  . Chronic back pain 06/17/2015  . Shoulder pain 06/11/2015  . Hyperlipidemia 06/10/2015  . Tobacco use disorder 05/28/2015  . Essential hypertension 05/27/2015  . Bipolar disorder (HCC) 05/27/2015  . 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 Obstructive sleep apnea 05/27/2015   Past Medical History:  Diagnosis Date  . Anxiety   . CAD (coronary artery disease), native coronary artery 08/19/2015  . Depression   . High cholesterol   . Hypertension   . OSA (obstructive sleep apnea)    Severe with AHI 80/hr  . PAF (paroxysmal atrial fibrillation) (HCC) 08/19/2015  . PTSD (post-traumatic stress disorder)   . Substance abuse     Family History  Problem Relation Age of Onset  . Diabetes Mother   .  Hypertension Mother   . Diabetes Father   . Hypertension Father   . Heart disease Father   . Alcohol abuse Father   . Alcohol abuse Brother   . Drug abuse Brother     Past Surgical History:  Procedure Laterality Date  . CARPAL TUNNEL RELEASE Left 11/02/2015  . MYOMECTOMY     Social History   Occupational History  . Not on file.   Social History Main Topics  . Smoking status: Current Every Day Smoker    Packs/day: 0.50    Types: Cigarettes  . Smokeless tobacco: Never Used  . Alcohol use No  . Drug use:     Types: Marijuana  . Sexual activity: Not Currently

## 2015-12-31 ENCOUNTER — Encounter (HOSPITAL_COMMUNITY): Payer: Self-pay | Admitting: Licensed Clinical Social Worker

## 2015-12-31 ENCOUNTER — Ambulatory Visit (INDEPENDENT_AMBULATORY_CARE_PROVIDER_SITE_OTHER): Payer: Commercial Managed Care - HMO | Admitting: Licensed Clinical Social Worker

## 2015-12-31 DIAGNOSIS — F331 Major depressive disorder, recurrent, moderate: Secondary | ICD-10-CM | POA: Diagnosis not present

## 2015-12-31 DIAGNOSIS — F319 Bipolar disorder, unspecified: Secondary | ICD-10-CM

## 2015-12-31 NOTE — Progress Notes (Signed)
                     male; who was referred back to me from psych IOP. Pt was discharged from that program due to his continuous doctor appts who interferred with the group times. Pt presented anxious and somewhat depressed. He sadly reported his health issues, lack of sleep, no $, being on disability. Pt is lonely and reports his mother who lives in [place] is his best friend, however, he says he is not his mother's best friend sadly. Pt has a contentious relationship with his father who lives in [place]. Pt has a hx of: disability and just recently had  carpal tunnel surgery (11-02-15).  Stressors:  Health Issues:  Heart Disease.  Had heart surgery two yrs ago and is on the list for a heart transplant.  Also, has sleep apnea.    Financial Strain due to medical costs.    No support system; except for his mother.   Has had an extensive long drug hx.  Has been admitted several times to rehabs.  Hx of seeing therapists and a psychiatrist.  Hx of self injurious behaviors (cutting).  Admits to prior suicide attempts by way of hanging and OD's.  Pt agreed that he would like to return to OP therapy to work on his stressors. Referred pt to Vocational Rehabilitation. Suggested pt look for volunteer work and to begin journaling.  2:15pm- 3pm      Daaiel Starlin S, LCAS,

## 2015-12-31 NOTE — Progress Notes (Signed)
   THERAPIST PROGRESS NOTE  Session Time: 10:10-11:00am  Participation Level: Active  Behavioral Response: CasualAlertDepressed  Type of Therapy: Individual Therapy  Treatment Goals addressed: Coping/Depression  Interventions: CBT and Supportive  Summary: Kyle ArnJon Greenspan is a 50 y.o. male who presents depressed. Pt is on disability, does not have enough $ to  Live on, has a contentious relationship with his father, has no friends, does not work and is in pain constantly. Pt moved her because his father (who has treated him horribly all of his life) suggested he move here with a promise of a job. There was no job, but pt wanted to try to have a "new relationship" with his father. His father and step-mother are both mean to him. Talked at length about expectations of others, while we only have control of ourself. Also processed with pt boundaries to use with his father and step-mother. 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 He has been sober for quite a while but uses marijuana on occasion for his pain, sleep issues, and anxiety/depression. Pt does not want to live in [place] anymore but has no where else to go. The only person is his life is his cat who he loves with much. Pt wants to be different person from his past years. Processed with pt about Vocational  Rehabilitation and see how that organization can help him. I also suggested some sort of sedentary job he could do. This was his homework to work on until next session. Pt was agreeable to suggestions.  Suicidal/Homicidal: Nowithout intent/plan  Therapist Response: Evaluated pt's current functioning and processed with pt family relationships, boundaries, expectations and helped with processing of stressors.  Plan: Return again in 2 weeks.  Diagnosis: Axis I: Depression, major, recurrent, moderate, Bipolar        Anner Baity S, LCAS 12/31/2015

## 2016-01-13 LAB — CUP PACEART REMOTE DEVICE CHECK
Brady Statistic RV Percent Paced: 0 %
Date Time Interrogation Session: 20171120073100
HighPow Impedance: 83 Ohm
Implantable Lead Implant Date: 20160218
Implantable Lead Serial Number: 358834
Lead Channel Impedance Value: 756 Ohm
Lead Channel Pacing Threshold Amplitude: 0.6 V
MDC IDC LEAD LOCATION: 753860
MDC IDC MSMT BATTERY REMAINING LONGEVITY: 144 mo
MDC IDC MSMT BATTERY REMAINING PERCENTAGE: 100 %
MDC IDC MSMT LEADCHNL RV PACING THRESHOLD PULSEWIDTH: 0.5 ms
MDC IDC PG IMPLANT DT: 20160218
MDC IDC SET LEADCHNL RV PACING AMPLITUDE: 2 V
MDC IDC SET LEADCHNL RV PACING PULSEWIDTH: 0.5 ms
MDC IDC SET LEADCHNL RV SENSING SENSITIVITY: 0.3 mV
Pulse Gen Serial Number: 202913

## 2016-01-14 ENCOUNTER — Ambulatory Visit (INDEPENDENT_AMBULATORY_CARE_PROVIDER_SITE_OTHER): Payer: Commercial Managed Care - HMO | Admitting: Licensed Clinical Social Worker

## 2016-01-14 DIAGNOSIS — F319 Bipolar disorder, unspecified: Secondary | ICD-10-CM

## 2016-01-14 DIAGNOSIS — F331 Major depressive disorder, recurrent, moderate: Secondary | ICD-10-CM | POA: Diagnosis not present

## 2016-01-14 NOTE — Progress Notes (Signed)
   THERAPIST PROGRESS NOTE  Session Time: 2:10-3pm  Participation Level: Active  Behavioral Response: CasualAlertDepressed  Type of Therapy: Individual Therapy  Treatment Goals addressed: Coping/Depression  Interventions: CBT and Supportive  Summary: Mariana ArnJon Burkley is a 50 y.o. male who presents a little less depressed today. Pt is on disability, does not have enough $ to live on, has a contentious relationship with his father, has no friends, does not work and is in pain constantly. 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 Pt  Reports he cannot find a job that he has the ability to do due to his chronic pain.When he called [facility] pt reports they were not taking any more appts until after the first of the year. Pt was not discouraged and will call them back. Processed with pt the holidays coming up. He will spend a little time with his father and step mother but it will be difficult because he does not think they are nice to him. Pt will try to go and enjoy some time with them.      Suicidal/Homicidal: Nowithout intent/plan  Therapist Response: Evaluated pt's current functioning and processed with pt family relationships, boundaries, expectations and helped with processing of stressors.  Plan: Return again in 2 weeks.  Diagnosis: Axis I: Depression, major, recurrent, moderate, Bipolar        Amirrah Quigley S, LCAS 01/14/16

## 2016-01-17 NOTE — Procedures (Signed)
   Patient Name: Kyle Peterson, Carlosdaniel Study Date: 12/28/2015 Gender: Male D.O.B: February 09, 1965 Age (years): [age] Referring Provider: Armanda Magicraci Turner MD, ABSM Height (inches): 68 Interpreting Physician: Armanda Magicraci Turner MD, ABSM Weight (lbs): 290 RPSGT: Neeriemer, Holly BMI: 44 MRN: [id number] Neck Size: 19.00  CLINICAL INFORMATION The patient is referred for a CPAP titration to treat sleep apnea. Date of NPSG, Split Night or HST:10/22/2015  SLEEP STUDY TECHNIQUE As per the AASM Manual for the Scoring of Sleep and Associated Events v2.3 (April 2016) with a hypopnea requiring 4% desaturations.  The channels recorded and monitored were frontal, central and occipital EEG, electrooculogram (EOG), submentalis EMG (chin), nasal and oral airflow, thoracic and abdominal wall motion, anterior tibialis EMG, snore microphone, electrocardiogram, and pulse oximetry. Continuous positive airway pressure (CPAP) was initiated at the beginning of the study and titrated to treat sleep-disordered breathing.  MEDICATIONS Medications self-administered by patient taken the night of the study : N/A  TECHNICIAN COMMENTS Comments added by technician: Patient could not tolerate CPAP. Patient requested to end study early due to PAP intolerance. Comments added by scorer: N/A  RESPIRATORY PARAMETERS Optimal PAP Pressure (cm):N/A  AHI at Optimal Pressure (/hr):N/A Overall Minimal O2 (%):N/A  Supine % at Optimal Pressure (%):N/A Minimal O2 at Optimal Pressure (%): N/A      SLEEP ARCHITECTURE The study was initiated at 11:03:03 PM and ended at 12:22:25 AM.  Sleep onset time was 12.4 minutes and the sleep efficiency was 59.9%. The total sleep time was 47.5 minutes.  The patient spent 11.58% of the night in stage N1 sleep, 88.42% in stage N2 sleep, 0.00% in stage N3 and 0.00% in REM.Stage REM latency was N/A minutes  Wake after sleep onset was 19.5. Alpha intrusion was absent. Supine sleep was 0.00%.  CARDIAC DATA The 2  lead EKG demonstrated sinus rhythm. The mean heart rate was 53.11 beats per minute. Other EKG findings include: None.  LEG MOVEMENT DATA The total Periodic Limb Movements of Sleep (PLMS) were 4. The PLMS index was 5.05. A PLMS index of <15 is considered normal in adults.  IMPRESSIONS - An optimal PAP pressure could not be obtained due to intolerance to the mask and ongoing respiratory events. - Central sleep apnea was not noted during this titration (CAI = 0.0/h). - Moderate oxygen desaturations were observed during this titration (min O2 = 85.00%). - The patient snored with Moderate snoring volume during this titration study. - No cardiac abnormalities were observed during this study. - Mild periodic limb movements were observed during this study. Arousals associated with PLMs were rare.  DIAGNOSIS - Obstructive Sleep Apnea (327.23 [G47.33 ICD-10])  RECOMMENDATIONS - Recommend repeat CPAP titration with mask desensitization prior to study. - Avoid alcohol, sedatives and other CNS depressants that may worsen sleep apnea and disrupt normal sleep architecture. - Sleep hygiene should be reviewed to assess factors that may improve sleep quality. - Weight management and regular exercise should be initiated or continued.  Armanda Magicraci Turner Diplomate, American Board of Sleep Medicine  ELECTRONICALLY SIGNED ON:  01/17/2016, 6:36 PM  [facility] PH: [phone]   FX: [phone] ACCREDITED BY THE AMERICAN ACADEMY OF SLEEP MEDICINE

## 2016-01-19 ENCOUNTER — Other Ambulatory Visit: Payer: Self-pay | Admitting: *Deleted

## 2016-01-19 ENCOUNTER — Encounter: Payer: Self-pay | Admitting: *Deleted

## 2016-01-19 ENCOUNTER — Encounter (HOSPITAL_COMMUNITY): Payer: Self-pay | Admitting: Licensed Clinical Social Worker

## 2016-01-19 DIAGNOSIS — G4733 Obstructive sleep apnea (adult) (pediatric): Secondary | ICD-10-CM

## 2016-01-20 ENCOUNTER — Encounter: Payer: Self-pay | Admitting: *Deleted

## 2016-01-21 NOTE — Progress Notes (Signed)
[facility] could do a 2 week mask desensitization prior to getting CPAP titration

## 2016-01-27 ENCOUNTER — Other Ambulatory Visit: Payer: Self-pay | Admitting: *Deleted

## 2016-01-27 ENCOUNTER — Telehealth: Payer: Self-pay | Admitting: *Deleted

## 2016-01-27 DIAGNOSIS — G4733 Obstructive sleep apnea (adult) (pediatric): Secondary | ICD-10-CM

## 2016-01-27 NOTE — Telephone Encounter (Signed)
-----   Message from Henderson NewcomerMelissa Stenson sent at 01/21/2016 11:25 AM EST ----- Regarding: RE: order Kyle Northina, [facility]. I think he will need to have this mask desensitization over at the sleep lab prior to his repeat titration. ----- Message ----- From: Reesa Cheworothea G Delaine Hernandez, CMA Sent: 01/19/2016   4:48 PM To: Melissa Stenson Subject: order                                          Please call [facility] to get a 2 week mask desensitization prior to repeat CPAP titration. Can you notify me when this is done please. Thanks, Kyle Peterson

## 2016-01-27 NOTE — Telephone Encounter (Signed)
I called the patient and let him know he has been scheduled for his mask desensitization on January 2 at 9 am [facility].

## 2016-02-02 ENCOUNTER — Ambulatory Visit (HOSPITAL_BASED_OUTPATIENT_CLINIC_OR_DEPARTMENT_OTHER): Payer: Commercial Managed Care - HMO | Attending: Cardiology

## 2016-02-11 ENCOUNTER — Encounter (HOSPITAL_COMMUNITY): Payer: Self-pay | Admitting: Licensed Clinical Social Worker

## 2016-02-11 ENCOUNTER — Ambulatory Visit (INDEPENDENT_AMBULATORY_CARE_PROVIDER_SITE_OTHER): Payer: Commercial Managed Care - HMO | Admitting: Licensed Clinical Social Worker

## 2016-02-11 DIAGNOSIS — F331 Major depressive disorder, recurrent, moderate: Secondary | ICD-10-CM | POA: Diagnosis not present

## 2016-02-11 DIAGNOSIS — F319 Bipolar disorder, unspecified: Secondary | ICD-10-CM
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
## 2016-02-16 ENCOUNTER — Telehealth: Payer: Self-pay | Admitting: *Deleted

## 2016-02-16 ENCOUNTER — Ambulatory Visit: Payer: Commercial Managed Care - HMO | Admitting: Cardiology

## 2016-02-16 ENCOUNTER — Telehealth: Payer: Self-pay

## 2016-02-16 NOTE — Telephone Encounter (Signed)
I called the sleep lab to see if the patients titration could be moved up because he has a pending surgery. (Kyle Peterson) the sleep lab scheduler informed me that there were no open appointments but she would put the patient at the top of the cancellation list to be called if any openings become available

## 2016-02-16 NOTE — Telephone Encounter (Signed)
Called patient to cancel appointment today since he has yet to have CPAP titration. He states he is having surgery by Dr. Annell GreeningMark Yates and needs to be cleared by Dr. Mayford Knifeurner. Informed him Dr. Ophelia CharterYates' office will be contacted for clearance request.  Informed patient Coralee Northina (CPAP assistant) will contact Sleep Lab and see if titration can be moved up given need for surgical clearance.

## 2016-02-16 NOTE — Telephone Encounter (Signed)
Called the patient and let him know that the sleep lab does not have any openings at this time to move his appointment up but they have placed him at the top of of the cancelation list to be called in the event something comes open    By Reesa Cheworothea G Tilia Faso, CMA

## 2016-02-19 ENCOUNTER — Encounter: Payer: Self-pay | Admitting: Internal Medicine

## 2016-02-19 ENCOUNTER — Ambulatory Visit (INDEPENDENT_AMBULATORY_CARE_PROVIDER_SITE_OTHER): Payer: Medicaid Other | Admitting: Internal Medicine

## 2016-02-19 DIAGNOSIS — R234 Changes in skin texture: Secondary | ICD-10-CM | POA: Insufficient documentation

## 2016-02-19 MED ORDER — AMOXICILLIN-POT CLAVULANATE 875-125 MG PO TABS: 1.0000 | ORAL_TABLET | Freq: Two times a day (BID) | ORAL | 0 refills | Status: DC

## 2016-02-19 NOTE — Patient Instructions (Signed)
Take the Augmentin over the weekend and apply warm compresses to the area four times per day. Please call on Monday if the area is not improving and we will call in a second antibiotic.

## 2016-02-19 NOTE — Progress Notes (Signed)
   Subjective:    Kyle ArnJon Margerum - 51 y.o. male MRN [id number]  Date of birth: [birth year]  HPI  Kyle Peterson is here for skin lesion.  Skin lesion: Of left forearm. Present for the past 2-3 days. Thought he had been bitten by some type of insect but then noted that he plays with his cat a lot who bites him frequently on the hands/arms. Area has been red, warm to the touch, and painful. Believes the area is actually getting smaller in size. When he first noted it there was some yellow drainage from the area as well but that has not occurred for the past couple of days.   -  reports that he has been smoking Cigarettes.  He has been smoking about 0.50 packs per day. He has never used smokeless tobacco. - Review of Systems: Per HPI. - Past Medical History: Patient Active Problem List   Diagnosis Date Noted  . Skin induration 02/19/2016  . Depression, major, recurrent, moderate (HCC) 11/05/2015  . Chronic post-traumatic stress disorder (PTSD) 11/05/2015    Class: Chronic  . OSA (obstructive sleep apnea)   . PAF (paroxysmal atrial fibrillation) (HCC) 08/19/2015  . CAD (coronary artery disease), native coronary artery 08/19/2015  . Chronic back pain 06/17/2015  . Shoulder pain 06/11/2015  . Hyperlipidemia 06/10/2015  . Tobacco use disorder 05/28/2015  . Essential hypertension 05/27/2015  . Bipolar disorder (HCC) 05/27/2015  . 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 Obstructive sleep apnea 05/27/2015   - Medications: reviewed and updated   Objective:   Physical Exam BP 132/74   Pulse 76   Temp 98.4 F (36.9 C) (Oral)   Ht 5\' 8"  (1.727 m)   Wt (!) 305 lb 6.4 oz (138.5 kg)   SpO2 94%   BMI 46.44 kg/m  Gen: NAD, alert, cooperative with exam, well-appearing Skin: indurated area on left forearm near wrist approximately 2 x 2 cm with minimal area of surrounding erythema, crusted yellowing in the center of the indurated area consistent with prior drainage, area is warm to the touch and  TTP   Assessment & Plan:   Skin induration No drainable area present on exam. Signs of infection present. Given concern for cat bite as cause of infection, will treat with Augmentin. Noted that patient is in need of Tdap vaccine. Given insurance, unable to receive at clinic so written prescription given so patient can receive at pharmacy. Instructed patient to apply warm compresses to the area 4 times daily. Exam findings potentially concerning for MRSA so if patient not improving in next 2-3 days he is to call and will send Rx for Doxycycline. Counseled patient to watch for spreading erythema and signs of systemic illness such as fever, nausea, vomiting etc.     Marcy Sirenatherine Wallace, D.O. 02/19/2016, 2:58 PM PGY-2, [facility]

## 2016-02-19 NOTE — Assessment & Plan Note (Addendum)
No drainable area present on exam. Signs of infection present. Given concern for cat bite as cause of infection, will treat with Augmentin. Noted that patient is in need of Tdap vaccine. Given insurance, unable to receive at clinic so written prescription given so patient can receive at pharmacy. Instructed patient to apply warm compresses to the area 4 times daily. Exam findings potentially concerning for MRSA so if patient not improving in next 2-3 days he is to call and will send Rx for Doxycycline. Counseled patient to watch for spreading erythema and signs of systemic illness such as fever, nausea, vomiting etc.

## 2016-02-23 NOTE — Telephone Encounter (Signed)
Left message for Dr. Ophelia CharterYates' surgery scheduler to fax clearance request to [phone].  Instructed her to call with questions or concerns.

## 2016-02-25 ENCOUNTER — Ambulatory Visit (INDEPENDENT_AMBULATORY_CARE_PROVIDER_SITE_OTHER): Payer: Medicare HMO | Admitting: Licensed Clinical Social Worker

## 2016-02-25 ENCOUNTER — Encounter (HOSPITAL_COMMUNITY): Payer: Self-pay | Admitting: Licensed Clinical Social Worker

## 2016-02-25 DIAGNOSIS — F331 Major depressive disorder, recurrent, moderate: Secondary | ICD-10-CM

## 2016-02-25 NOTE — Progress Notes (Signed)
   THERAPIST PROGRESS NOTE  Session Time: 2:10-3pm  Participation Level: Active  Behavioral Response: CasualAlertDepressed  Type of Therapy: Individual Therapy  Treatment Goals addressed: Coping/Depression  Interventions: CBT and Supportive  Summary: Kyle Peterson is a 51 y.o. male who presents a depressed and hopeless. Pt is on disability, does not have enough $ to live on, has a contentious relationship with his father, has no friends, does not work and is in pain constantly. Each session pt comes in with the same story. He is not willing to change anything and continues to talk about how bad his life is. He has minimal if any relationship with his father and is craving a relationship with him. Again, asked pt if he wants to continue in therapy and he said he would like to come in and talk about "life.". Pt talks about all he lost due to his previous drug use and beats himself up about it. Processed with pt that he is hard on himself as his father is on him. He will not talk with them about their relationship nor will he try to change it. Pt did agree to an appt with a psychiatrist for medication. Pt has researched some medications he may be willing to try. Gave pt information on genesight for cheek swab which he will research and speak with the psychiatrist about.Will continue to work with pt.      Suicidal/Homicidal: Nowithout intent/plan  Therapist Response: Evaluated pt'Peterson current functioning and processed with pt family relationships, boundaries, expectations and processing of stressors.  Plan: Return again in 2 weeks.  Diagnosis: Axis I: Depression, major, recurrent, moderate, Bipolar        Kyle Peterson, LCAS 02/25/16

## 2016-03-06 ENCOUNTER — Ambulatory Visit (HOSPITAL_BASED_OUTPATIENT_CLINIC_OR_DEPARTMENT_OTHER): Payer: Medicaid Other | Attending: Cardiology

## 2016-03-10 ENCOUNTER — Ambulatory Visit (INDEPENDENT_AMBULATORY_CARE_PROVIDER_SITE_OTHER): Payer: Medicare HMO | Admitting: Psychiatry

## 2016-03-10 ENCOUNTER — Encounter (HOSPITAL_COMMUNITY): Payer: Self-pay | Admitting: Psychiatry

## 2016-03-10 VITALS — BP 130/82 | HR 74 | Ht 68.0 in | Wt 302.4 lb

## 2016-03-10 DIAGNOSIS — Z79899 Other long term (current) drug therapy: Secondary | ICD-10-CM | POA: Diagnosis not present

## 2016-03-10 DIAGNOSIS — F4310 Post-traumatic stress disorder, unspecified: Secondary | ICD-10-CM | POA: Diagnosis not present

## 2016-03-10 DIAGNOSIS — F319 Bipolar disorder, unspecified: Secondary | ICD-10-CM

## 2016-03-10 DIAGNOSIS — F1210 Cannabis abuse, uncomplicated: Secondary | ICD-10-CM | POA: Diagnosis not present
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
## 2016-03-14 NOTE — Telephone Encounter (Signed)
Clearance faxed - in Media tab.   Patient never completed titration 2/4. To OSA Assistant for follow-up.

## 2016-03-15 ENCOUNTER — Other Ambulatory Visit (HOSPITAL_COMMUNITY): Payer: Medicare HMO | Attending: Psychiatry | Admitting: Psychiatry

## 2016-03-15 ENCOUNTER — Ambulatory Visit (HOSPITAL_COMMUNITY): Payer: Self-pay | Admitting: Licensed Clinical Social Worker

## 2016-03-15 ENCOUNTER — Encounter (HOSPITAL_COMMUNITY): Payer: Self-pay | Admitting: Psychiatry

## 2016-03-15 DIAGNOSIS — F332 Major depressive disorder, recurrent severe without psychotic features: Secondary | ICD-10-CM | POA: Diagnosis not present

## 2016-03-15 DIAGNOSIS — G4733 Obstructive sleep apnea (adult) (pediatric): Secondary | ICD-10-CM | POA: Diagnosis not present

## 2016-03-15 DIAGNOSIS — I10 Essential (primary) hypertension: Secondary | ICD-10-CM | POA: Diagnosis not present

## 2016-03-15 DIAGNOSIS — E7800 Pure hypercholesterolemia, unspecified: Secondary | ICD-10-CM | POA: Diagnosis not present

## 2016-03-15 DIAGNOSIS — I2510 Atherosclerotic heart disease of native coronary artery without angina pectoris: Secondary | ICD-10-CM | POA: Diagnosis not present

## 2016-03-15 DIAGNOSIS — F418 Other specified anxiety disorders: Secondary | ICD-10-CM | POA: Diagnosis not present

## 2016-03-15 DIAGNOSIS — G4700 Insomnia, unspecified: Secondary | ICD-10-CM | POA: Diagnosis not present

## 2016-03-15 DIAGNOSIS — F319 Bipolar disorder, unspecified: Secondary | ICD-10-CM

## 2016-03-15 DIAGNOSIS — F4310 Post-traumatic stress disorder, unspecified: Secondary | ICD-10-CM | POA: Diagnosis not present

## 2016-03-15 DIAGNOSIS — I480 Paroxysmal atrial fibrillation: Secondary | ICD-10-CM | POA: Diagnosis not present

## 2016-03-15 NOTE — Telephone Encounter (Signed)
03/15/16 lmtcb regarding missed titration appt that had been scheduled for 03/06/16.

## 2016-03-15 NOTE — Progress Notes (Signed)
Psychiatric Initial Adult Assessment   Patient Identification: Kyle Peterson MRN:  [id number] Date of Evaluation:  03/15/2016 Referral Source: self Chief Complaint:   Chief Complaint    Depression; Anxiety; Stress     Visit Diagnosis: severe major depression, recurrent without psychotic features History of Present Illness:  Kyle Peterson has been depressed for years.  He moved to [place] about a year ago to be closer to his father but the depression has persisted.  No energy, poor focus, no motivation or interest in things he used to enjoy, no excitement in living but not suicidal.  He is alone here with no friends or support.  He loves music and was employed as a Technical sales engineer for years but cannot find work in that area ore elsewhere.  Has significant heart pathology and was once on a heart transplant list but not now.  So, he says he would not be employable anyway. He has no wife or kids. Finances are a stress.  Associated Signs/Symptoms: Depression Symptoms:  depressed mood, anhedonia, insomnia, hypersomnia, fatigue, difficulty concentrating, impaired memory, (Hypo) Manic Symptoms:  Irritable Mood, Anxiety Symptoms:  Excessive Worry, Psychotic Symptoms:  none PTSD Symptoms: Negative  Past Psychiatric History: previous inpatient stays and significant polysubstance abuse years ago   Previous Psychotropic Medications: Yes   Substance Abuse History in the last 12 months:  No.  Consequences of Substance Abuse: Negative  Past Medical History:  Past Medical History:  Diagnosis Date  . Anxiety   . CAD (coronary artery disease), native coronary artery 08/19/2015  . Depression   . High cholesterol   . Hypertension   . OSA (obstructive sleep apnea)    Severe with AHI 80/hr  . PAF (paroxysmal atrial fibrillation) (HCC) 08/19/2015  . PTSD (post-traumatic stress disorder)   . Substance abuse     Past Surgical History:  Procedure Laterality Date  . CARPAL TUNNEL RELEASE Left 11/02/2015  .  MYOMECTOMY      Family Psychiatric History: none os current relevance  Family History:  Family History  Problem Relation Age of Onset  . Diabetes Mother   . Hypertension Mother   . Diabetes Father   . Hypertension Father   . Heart disease Father   . Alcohol abuse Father   . Alcohol abuse Brother   . Drug abuse Brother     Social History:   Social History   Social History  . Marital status: Divorced    Spouse name: N/A  . Number of children: N/A  . Years of education: N/A   Social History Main Topics  . Smoking status: Current Every Day Smoker    Packs/day: 0.50    Types: Cigarettes  . Smokeless tobacco: Never Used  . Alcohol use No  . Drug use: Yes    Types: Marijuana  . Sexual activity: Not Currently   Other Topics Concern  . None   Social History Narrative   Recently moved to [facility]   Father is in [place].   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 Ace Inhibitors Shortness Of Breath and Cough    Metabolic Disorder Labs: No results found for: HGBA1C, MPG No results found for: PROLACTIN Lab Results  Component Value Date   CHOL 211 (H) 06/10/2015   TRIG 200 (H) 06/10/2015   HDL 38 (L)  06/10/2015   CHOLHDL 5.6 (H) 06/10/2015   VLDL 40 (H) 06/10/2015   LDLCALC 133 (H) 06/10/2015     Current Medications: Current Outpatient Prescriptions  Medication Sig Dispense Refill  . acetaminophen-codeine (TYLENOL #3) 300-30 MG tablet Take 2 tablets by mouth every 4 (four) hours as needed for moderate pain.    Marland Kitchen. amoxicillin-clavulanate (AUGMENTIN) 875-125 MG tablet Take 1 tablet by mouth 2 (two) times daily. 28 tablet 0  . CARTIA XT 240 MG 24 hr capsule TAKE 1 CAPSULE (240 MG TOTAL) BY MOUTH DAILY. 90 capsule 3  . furosemide (LASIX) 20 MG tablet Take 1 tablet (20 mg total) by mouth daily. 15 tablet 0  .  losartan (COZAAR) 50 MG tablet Take 1 tablet (50 mg total) by mouth daily. 90 tablet 3  . metoprolol succinate (TOPROL-XL) 50 MG 24 hr tablet Take 1 tablet (50 mg total) by mouth daily. Take with or immediately following a meal. 15 tablet 0  . orphenadrine (NORFLEX) 100 MG tablet Take 1 tablet (100 mg total) by mouth 2 (two) times daily as needed for muscle spasms. 90 tablet 0  . rivaroxaban (XARELTO) 20 MG TABS tablet Take 1 tablet (20 mg total) by mouth daily with supper. 30 tablet 0   No current facility-administered medications for this visit.     Neurologic: Headache: Negative Seizure: Negative Paresthesias:Negative  Musculoskeletal: Strength & Muscle Tone: within normal limits Gait & Station: normal Patient leans: N/A  Psychiatric Specialty Exam: ROS  There were no vitals taken for this visit.There is no height or weight on file to calculate BMI.  General Appearance: Well Groomed  Eye Contact:  Good  Speech:  Clear and Coherent  Volume:  Normal  Mood:  Depressed  Affect:  Congruent  Thought Process:  Coherent and Goal Directed  Orientation:  Full (Time, Place, and Person)  Thought Content:  logical  Suicidal Thoughts:  No  Homicidal Thoughts:  No  Memory:  Immediate;   Good Recent;   Good Remote;   Good  Judgement:  Intact  Insight:  Good  Psychomotor Activity:  Normal  Concentration:  Concentration: Good and Attention Span: Good  Recall:  Good  Fund of Knowledge:Good  Language: Good  Akathisia:  Negative  Handed:  Right  AIMS (if indicated):  0  Assets:  Communication Skills Desire for Improvement Housing Resilience Talents/Skills Transportation Vocational/Educational  ADL's:  Intact  Cognition: WNL  Sleep:  poor    Treatment Plan Summary: Admit to IOP with daily group therapy.  Does not want to be on medication if at all possible   Carolanne GrumblingGerald Artin Mceuen, MD 2/13/201811:57 AM
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
## 2016-03-16 ENCOUNTER — Other Ambulatory Visit (HOSPITAL_COMMUNITY): Payer: Medicare HMO

## 2016-03-16 ENCOUNTER — Encounter: Payer: Self-pay | Admitting: *Deleted

## 2016-03-16 NOTE — Telephone Encounter (Signed)
Called the patient to reschedule his titration and that was completed for April 4,2018

## 2016-03-17 ENCOUNTER — Other Ambulatory Visit (HOSPITAL_COMMUNITY): Payer: Medicare HMO | Admitting: Psychiatry

## 2016-03-17 DIAGNOSIS — G4733 Obstructive sleep apnea (adult) (pediatric): Secondary | ICD-10-CM | POA: Diagnosis not present

## 2016-03-17 DIAGNOSIS — F418 Other specified anxiety disorders: Secondary | ICD-10-CM | POA: Diagnosis not present

## 2016-03-17 DIAGNOSIS — G4700 Insomnia, unspecified: Secondary | ICD-10-CM | POA: Diagnosis not present

## 2016-03-17 DIAGNOSIS — F4310 Post-traumatic stress disorder, unspecified: Secondary | ICD-10-CM | POA: Diagnosis not present

## 2016-03-17 DIAGNOSIS — I2510 Atherosclerotic heart disease of native coronary artery without angina pectoris: Secondary | ICD-10-CM | POA: Diagnosis not present

## 2016-03-17 DIAGNOSIS — I480 Paroxysmal atrial fibrillation: Secondary | ICD-10-CM | POA: Diagnosis not present

## 2016-03-17 DIAGNOSIS — F332 Major depressive disorder, recurrent severe without psychotic features: Secondary | ICD-10-CM | POA: Diagnosis not present

## 2016-03-17 DIAGNOSIS — E7800 Pure hypercholesterolemia, unspecified: Secondary | ICD-10-CM | POA: Diagnosis not present

## 2016-03-17 DIAGNOSIS — I10 Essential (primary) hypertension: Secondary | ICD-10-CM | POA: Diagnosis not present

## 2016-03-17 DIAGNOSIS — F319 Bipolar disorder, unspecified: Secondary | ICD-10-CM

## 2016-03-17 NOTE — Progress Notes (Signed)
    Daily Group Progress Note  Program: IOP  Group Time: 9:00-12:00   Participation Level: active   Behavioral Response:  responsive   Type of Therapy:   group therapy  Summary of Progress: This was pt's first day in group. He met with the social worker and psychiatrist. He shared a bit about his narrative, his years as a Therapist, nutritional, his struggle with alcohol and drugs, and his recent move to [place] from [place] for a relationship that has recently ended. Pt expressed the sentiment that he does not like being around people, then later shared that he is largely scared of being around others, which makes his dislike seem more like a symptom of social anxiety, rather than genuine misanthropy. At the end of group pt participated in psycho-ed around identifying and processing feelings.  Nancie Neas, LPC

## 2016-03-18 ENCOUNTER — Other Ambulatory Visit (HOSPITAL_COMMUNITY): Payer: Medicare HMO

## 2016-03-21 ENCOUNTER — Telehealth: Payer: Self-pay | Admitting: Cardiology

## 2016-03-21 ENCOUNTER — Ambulatory Visit (INDEPENDENT_AMBULATORY_CARE_PROVIDER_SITE_OTHER): Payer: Medicare HMO | Admitting: *Deleted

## 2016-03-21 DIAGNOSIS — I422 Other hypertrophic cardiomyopathy: Secondary | ICD-10-CM

## 2016-03-21 NOTE — Telephone Encounter (Signed)
Spoke with pt and reminded pt of remote transmission that is due today. Pt verbalized understanding.   

## 2016-03-21 NOTE — Progress Notes (Signed)
Kyle ArnJon Peterson is a 51 y.o. , divorced, disabled, Caucasian male; who was referred per psychiatrist (Dr. Lolly MustacheArfeen), treatment for worsening on and off depressive symptoms for yrs.   Pt is well known to Clinical research associatewriter d/t being admitted in MH-IOP for one day on 11-24-15 before his surgery.  He is on disability and just had  carpal tunnel surgery (11-02-15).  The plan was for him to return to MH-IOP after the surgery, but he had complications.  According to pt, he has become more and more depressed with SI since his heart surgery.  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  Stressors:  Health Issues:  Heart Disease.  Had heart surgery two yrs ago and is on the list for a heart transplant.  Also, has sleep apnea.  2)  Relationship issues:  GF is a drug addict.  "I thought I could save her, but finally realized that I can't."  3)  Financial Strain due to medical costs.  4)  No support system; except for his mother.  "My mom is my best friend."  5) Unresolved grief/loss issues:  States several people in his apartment complex has died since he moved there in October 2017.  Has had an extensive long drug hx.  Has been admitted several times to rehabs.  Hx of seeing therapists and a psychiatrist.  Hx of self injurious behaviors (cutting).  Admits to prior suicide attempts by way of hanging and OD's.  Family Hx:  deceased father (ETOH); brother. Pt only attended MH-IOP for two days.  He called and stated that he felt uncomfortable in the groups and wanted discharge.  Denied SI/HI or A/V hallucinations.  A:  D/C today.  F/U with Dr. Lolly MustacheArfeen on 04-05-16 @ 3pm (if he decides he would like medication; if not writer informed him to cancel appt).  He will also f/u with Idalia NeedleBeth MacKenzie, LCAS on 04-26-16 @ 9 am.  Encouraged support groups.  R:  Pt receptive.        Chestine SporeLARK, RITA, M.Ed, CNA

## 2016-03-21 NOTE — Progress Notes (Signed)
    Daily Group Progress Note  Program: IOP Jo Group Time: 9:00-12:00   Participation Level: active    Behavioral Response: engaged   Type of Therapy:   group therapy  Summary of Progress:  Pt. was very talkative today in group and seem unfocused and unable to stay on any particular topic.  Pt. stated how he hates people, feels disappointment with his family, attempts to help people but then is bothered by a world that is unforgiving and cruel.  Pt. does not want to take medication but the group processed how he is very unfocused and overwhelmed by things that medicine can usually help.  Pt. recounted his experience with medication, which was scary for him, and made the decision to consult with a psychiatrist about some new medication.    Shaune PollackBrown, Ihsan Nomura B, LPC

## 2016-03-22 ENCOUNTER — Encounter: Payer: Self-pay | Admitting: Cardiology

## 2016-03-22 ENCOUNTER — Other Ambulatory Visit (HOSPITAL_COMMUNITY): Payer: Medicare HMO

## 2016-03-22 LAB — CUP PACEART REMOTE DEVICE CHECK
Battery Remaining Longevity: 144 mo
Date Time Interrogation Session: 20180220124500
HighPow Impedance: 74 Ohm
Implantable Lead Implant Date: 20160218
Implantable Lead Location: 753860
Implantable Lead Model: 292
Implantable Pulse Generator Implant Date: 20160218
Lead Channel Pacing Threshold Pulse Width: 0.5 ms
Lead Channel Setting Pacing Pulse Width: 0.5 ms
MDC IDC LEAD SERIAL: 358834
MDC IDC MSMT BATTERY REMAINING PERCENTAGE: 100 %
MDC IDC MSMT LEADCHNL RV IMPEDANCE VALUE: 808 Ohm
MDC IDC MSMT LEADCHNL RV PACING THRESHOLD AMPLITUDE: 0.6 V
MDC IDC PG SERIAL: 202913
MDC IDC SET LEADCHNL RV PACING AMPLITUDE: 2 V
MDC IDC SET LEADCHNL RV SENSING SENSITIVITY: 0.3 mV
MDC IDC STAT BRADY RV PERCENT PACED: 0 %

## 2016-03-22 NOTE — Progress Notes (Signed)
[facility] IOP DISCHARGE NOTE  Patient:  Kyle Peterson DOB:  [birth year]  Date of Admission: 03/15/2016  Date of Discharge: 03/21/2016  Reason for Admission:depression  IOP Course:attended a couple of days and decided he was not comfortable in this particular group and requested discharge  Mental Status at Discharge:over the phone indicated to case manager that he was not suicidal   Diagnosis: major depression, recurrent, sever without psychosis  Level of Care:  IOP  Discharge destination: has appointment with psychiatrist and therapist     Comments:  none  The patient received suicide prevention pamphlet:  Yes   Carolanne GrumblingGerald Taylor, MD Patient ID: Kyle Peterson, male   DOB: [birth year], 51 y.o.   MRN: [id number]

## 2016-03-22 NOTE — Progress Notes (Signed)
Remote ICD transmission.   

## 2016-03-23 ENCOUNTER — Other Ambulatory Visit (HOSPITAL_COMMUNITY): Payer: Medicare HMO

## 2016-03-24 ENCOUNTER — Other Ambulatory Visit (HOSPITAL_COMMUNITY): Payer: Medicare HMO

## 2016-03-25 ENCOUNTER — Other Ambulatory Visit (HOSPITAL_COMMUNITY): Payer: Medicare HMO

## 2016-03-28 ENCOUNTER — Other Ambulatory Visit (HOSPITAL_COMMUNITY): Payer: Medicare HMO

## 2016-03-29 ENCOUNTER — Other Ambulatory Visit (HOSPITAL_COMMUNITY): Payer: Medicare HMO

## 2016-03-30 ENCOUNTER — Other Ambulatory Visit (HOSPITAL_COMMUNITY): Payer: Medicare HMO

## 2016-03-31 ENCOUNTER — Other Ambulatory Visit (HOSPITAL_COMMUNITY): Payer: Medicare HMO

## 2016-04-01 ENCOUNTER — Other Ambulatory Visit (HOSPITAL_COMMUNITY): Payer: Medicare HMO

## 2016-04-04 ENCOUNTER — Other Ambulatory Visit (HOSPITAL_COMMUNITY): Payer: Medicare HMO

## 2016-04-05 ENCOUNTER — Other Ambulatory Visit (HOSPITAL_COMMUNITY): Payer: Medicare HMO

## 2016-04-05 ENCOUNTER — Encounter (HOSPITAL_COMMUNITY): Payer: Self-pay | Admitting: Psychiatry

## 2016-04-05 ENCOUNTER — Ambulatory Visit (INDEPENDENT_AMBULATORY_CARE_PROVIDER_SITE_OTHER): Payer: Medicare HMO | Admitting: Psychiatry

## 2016-04-05 VITALS — BP 140/86 | HR 79 | Ht 68.0 in | Wt 302.0 lb

## 2016-04-05 DIAGNOSIS — F1290 Cannabis use, unspecified, uncomplicated: Secondary | ICD-10-CM | POA: Diagnosis not present

## 2016-04-05 DIAGNOSIS — F17210 Nicotine dependence, cigarettes, uncomplicated: Secondary | ICD-10-CM

## 2016-04-05 DIAGNOSIS — Z813 Family history of other psychoactive substance abuse and dependence: Secondary | ICD-10-CM | POA: Diagnosis not present

## 2016-04-05 DIAGNOSIS — Z79899 Other long term (current) drug therapy: Secondary | ICD-10-CM | POA: Diagnosis not present

## 2016-04-05 DIAGNOSIS — Z811 Family history of alcohol abuse and dependence: Secondary | ICD-10-CM

## 2016-04-05 DIAGNOSIS — F319 Bipolar disorder, unspecified: Secondary | ICD-10-CM

## 2016-04-05 DIAGNOSIS — Z888 Allergy status to other drugs, medicaments and biological substances status: Secondary | ICD-10-CM

## 2016-04-05 DIAGNOSIS — F4310 Post-traumatic stress disorder, unspecified: Secondary | ICD-10-CM

## 2016-04-05 NOTE — Progress Notes (Signed)
BH MD/PA/NP OP Progress Note  04/05/2016 2:56 PM Kyle Peterson  MRN:  [id number]  Chief Complaint:  Subjective:  I did not like the program.  HPI: Kyle Peterson came for his follow-up appointment.  He tried IOP but he did not like the program.  He admitted getting easily irritable and he decided to stop coming.  He admitted poor sleep because he is not using CPAP machine.  He endorse some time having flashbacks and bad dreams about his past trauma.  He admitted chronic irritability and paranoia but is not worsening.  He is very reluctant to try any medication.  He denies any suicidal thoughts or homicidal thought.  He is on disability.  He admitted that he is able to handle is anger issues and he does not feel he need medication for that.  He like to continue therapy with Beth.  He denies drinking or using any illegal substances.  Patient admitted he has limited social network any worried about his future because he has no other support system.  He is complaining of joint pain.  He had degenerative joint disease.    Visit Diagnosis:    ICD-9-CM ICD-10-CM   1. Bipolar I disorder (HCC) 296.7 F31.9     Past Psychiatric History: Reviewed. Patient has extensive history of psychiatric treatment in the past.  He has at least 10 psychiatric hospitalization because of psychosis, depression, suicidal thoughts complicated with using drugs.  His last psychiatric hospital and was 10 years ago in [place].  He was diagnosed with schizoaffective disorder and bipolar disorder.  He also had posttraumatic stress disorder.  In the past he had tried Abilify, Remeron, trazodone, Prozac and Klonopin.  He had history of cutting himself however he has not done in more than 10 years.  Past Medical History:  Past Medical History:  Diagnosis Date  . Anxiety   . CAD (coronary artery disease), native coronary artery 08/19/2015  . Depression   . High cholesterol   . Hypertension   . OSA (obstructive sleep apnea)    Severe  with AHI 80/hr  . PAF (paroxysmal atrial fibrillation) (HCC) 08/19/2015  . PTSD (post-traumatic stress disorder)   . Substance abuse     Past Surgical History:  Procedure Laterality Date  . CARPAL TUNNEL RELEASE Left 11/02/2015  . MYOMECTOMY      Family Psychiatric History: Reviewed.  Family History:  Family History  Problem Relation Age of Onset  . Diabetes Mother   . Hypertension Mother   . Diabetes Father   . Hypertension Father   . Heart disease Father   . Alcohol abuse Father   . Alcohol abuse Brother   . Drug abuse Brother     Social History:  Social History   Social History  . Marital status: Divorced    Spouse name: N/A  . Number of children: N/A  . Years of education: N/A   Social History Main Topics  . Smoking status: Current Every Day Smoker    Packs/day: 0.50    Types: Cigarettes  . Smokeless tobacco: Never Used     Comment: Trying to cut back   . Alcohol use No  . Drug use: Yes    Frequency: 3.0 times per week    Types: Marijuana  . Sexual activity: Not Currently   Other Topics Concern  . None   Social History Narrative   Recently moved to [facility]   Father is in [place].   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 Ace Inhibitors Shortness Of Breath and Cough    Metabolic Disorder Labs: No results found for: HGBA1C, MPG No results found for: PROLACTIN Lab Results  Component Value Date   CHOL 211 (H) 06/10/2015   TRIG 200 (H) 06/10/2015   HDL 38 (L) 06/10/2015   CHOLHDL 5.6 (H) 06/10/2015   VLDL 40 (H) 06/10/2015   LDLCALC 133 (H) 06/10/2015     Current Medications: Current Outpatient Prescriptions  Medication Sig Dispense Refill  . acetaminophen-codeine (TYLENOL #3) 300-30 MG tablet Take 2 tablets by mouth every 4 (four) hours as needed for moderate pain.    Marland Kitchen. amoxicillin-clavulanate (AUGMENTIN) 875-125 MG tablet Take 1 tablet by mouth 2 (two) times  daily. 28 tablet 0  . CARTIA XT 240 MG 24 hr capsule TAKE 1 CAPSULE (240 MG TOTAL) BY MOUTH DAILY. 90 capsule 3  . furosemide (LASIX) 20 MG tablet Take 1 tablet (20 mg total) by mouth daily. 15 tablet 0  . losartan (COZAAR) 50 MG tablet Take 1 tablet (50 mg total) by mouth daily. 90 tablet 3  . metoprolol succinate (TOPROL-XL) 50 MG 24 hr tablet Take 1 tablet (50 mg total) by mouth daily. Take with or immediately following a meal. 15 tablet 0  . orphenadrine (NORFLEX) 100 MG tablet Take 1 tablet (100 mg total) by mouth 2 (two) times daily as needed for muscle spasms. 90 tablet 0  . rivaroxaban (XARELTO) 20 MG TABS tablet Take 1 tablet (20 mg total) by mouth daily with supper. 30 tablet 0   No current facility-administered medications for this visit.     Neurologic: Headache: No Seizure: No Paresthesias: No  Musculoskeletal: Strength & Muscle Tone: within normal limits Gait & Station: normal Patient leans: N/A  Psychiatric Specialty Exam: ROS  Blood pressure 140/86, pulse 79, height 5\' 8"  (1.727 m), weight (!) 302 lb (137 kg).Body mass index is 45.92 kg/m.  General Appearance: Casual  Eye Contact:  Fair  Speech:  Clear and Coherent  Volume:  Normal  Mood:  Irritable  Affect:  Congruent  Thought Process:  Goal Directed  Orientation:  Full (Time, Place, and Person)  Thought Content: Rumination   Suicidal Thoughts:  No  Homicidal Thoughts:  No  Memory:  Immediate;   Fair Recent;   Fair Remote;   Fair  Judgement:  Fair  Insight:  Fair  Psychomotor Activity:  Restlessness  Concentration:  Concentration: Fair and Attention Span: Fair  Recall:  Good  Fund of Knowledge: Good  Language: Good  Akathisia:  No  Handed:  Right  AIMS (if indicated):  0  Assets:  Communication Skills Housing  ADL's:  Intact  Cognition: WNL  Sleep:  fair    Assessment: Bipolar disorder type I.  Posttraumatic stress disorder.  Plan: Patient is not interested to take medication.  I encouraged  him to resume therapy with but and if symptoms started to get worse then we should consider starting medication.  Patient agreed with the plan.  I recommended if he ever had any suicidal thoughts or homicidal thoughts and he need to call 911 or go to local emergency room.  We will not schedule appointment for medication management but patient will see but for counseling.  Kyle Lunt T., MD 04/05/2016, 2:56 PM

## 2016-04-06 ENCOUNTER — Other Ambulatory Visit (HOSPITAL_COMMUNITY): Payer: Medicare HMO

## 2016-04-07 ENCOUNTER — Other Ambulatory Visit (HOSPITAL_COMMUNITY): Payer: Medicare HMO

## 2016-04-08 ENCOUNTER — Other Ambulatory Visit (HOSPITAL_COMMUNITY): Payer: Medicare HMO

## 2016-04-11 ENCOUNTER — Other Ambulatory Visit (HOSPITAL_COMMUNITY): Payer: Medicare HMO

## 2016-04-12 ENCOUNTER — Other Ambulatory Visit (HOSPITAL_COMMUNITY): Payer: Medicare HMO

## 2016-04-13 ENCOUNTER — Other Ambulatory Visit (HOSPITAL_COMMUNITY): Payer: Medicare HMO

## 2016-04-14 ENCOUNTER — Other Ambulatory Visit (HOSPITAL_COMMUNITY): Payer: Medicare HMO

## 2016-04-15 ENCOUNTER — Other Ambulatory Visit (HOSPITAL_COMMUNITY): Payer: Medicare HMO

## 2016-04-18 ENCOUNTER — Other Ambulatory Visit: Payer: Self-pay | Admitting: Family Medicine

## 2016-04-18 ENCOUNTER — Other Ambulatory Visit (HOSPITAL_COMMUNITY): Payer: Medicare HMO

## 2016-04-19 ENCOUNTER — Other Ambulatory Visit (HOSPITAL_COMMUNITY): Payer: Medicare HMO

## 2016-04-20 ENCOUNTER — Other Ambulatory Visit (HOSPITAL_COMMUNITY): Payer: Medicare HMO

## 2016-04-21 ENCOUNTER — Other Ambulatory Visit (HOSPITAL_COMMUNITY): Payer: Medicare HMO

## 2016-04-22 ENCOUNTER — Other Ambulatory Visit (HOSPITAL_COMMUNITY): Payer: Medicare HMO

## 2016-04-22 ENCOUNTER — Telehealth: Payer: Self-pay | Admitting: Cardiology

## 2016-04-22 DIAGNOSIS — I480 Paroxysmal atrial fibrillation: Secondary | ICD-10-CM

## 2016-04-22 MED ORDER — RIVAROXABAN 20 MG PO TABS: 20.0000 mg | ORAL_TABLET | Freq: Every day | ORAL | 1 refills | Status: DC

## 2016-04-22 NOTE — Telephone Encounter (Signed)
New message    *STAT* If patient is at the pharmacy, call can be transferred to refill team.   1. Which medications need to be refilled? (please list name of each medication and dose if known) rivaroxaban (XARELTO) 20 MG TABS tablet  2. Which pharmacy/location (including street and city if local pharmacy) is medication to be sent to? [facility] [place]  3. Do they need a 30 day or 90 day supply? 90 day

## 2016-04-22 NOTE — Telephone Encounter (Signed)
Pt last saw Dr Mayford Knifeurner 08/19/15, pt's last labs 11/11/15 Creat 1.20.  Pt is on Xarelto 20mg  QD which based on pt's CrCl 144.29 is appropriate dosing.  Will refill rx.

## 2016-04-25 ENCOUNTER — Other Ambulatory Visit (HOSPITAL_COMMUNITY): Payer: Medicare HMO

## 2016-04-26 ENCOUNTER — Telehealth: Payer: Self-pay | Admitting: Cardiology

## 2016-04-26 ENCOUNTER — Other Ambulatory Visit (HOSPITAL_COMMUNITY): Payer: Medicare HMO

## 2016-04-26 ENCOUNTER — Ambulatory Visit (HOSPITAL_COMMUNITY): Payer: Self-pay | Admitting: Licensed Clinical Social Worker

## 2016-04-26 NOTE — Telephone Encounter (Signed)
Spoke with patient and he is waiting for his mail order to arrive. He is aware that I will place samples at the front desk.

## 2016-04-26 NOTE — Telephone Encounter (Signed)
Patient calling the office for samples of medication:   1.  What medication and dosage are you requesting samples for? Xarelto 20mg    2.  Are you currently out of this medication? Yes , has not had any for a week .

## 2016-04-27 ENCOUNTER — Other Ambulatory Visit (HOSPITAL_COMMUNITY): Payer: Medicare HMO

## 2016-04-27 ENCOUNTER — Encounter (HOSPITAL_COMMUNITY): Payer: Self-pay | Admitting: Licensed Clinical Social Worker

## 2016-04-27 ENCOUNTER — Ambulatory Visit (INDEPENDENT_AMBULATORY_CARE_PROVIDER_SITE_OTHER): Payer: Medicare HMO | Admitting: Licensed Clinical Social Worker

## 2016-04-27 DIAGNOSIS — F319 Bipolar disorder, unspecified: Secondary | ICD-10-CM | POA: Diagnosis not present

## 2016-04-28 ENCOUNTER — Other Ambulatory Visit (HOSPITAL_COMMUNITY): Payer: Medicare HMO

## 2016-04-29 ENCOUNTER — Other Ambulatory Visit (HOSPITAL_COMMUNITY): Payer: Medicare HMO

## 2016-05-02 ENCOUNTER — Other Ambulatory Visit (HOSPITAL_COMMUNITY): Payer: Medicare HMO

## 2016-05-02 NOTE — Progress Notes (Signed)
   THERAPIST PROGRESS NOTE  Session Time: 1:10-2pm  Participation Level: Active  Behavioral Response: CasualAlertDepressed  Type of Therapy: Individual Therapy  Treatment Goals addressed: Coping/Depression  Interventions: CBT and Supportive  Summary: Kyle Peterson is a 51 y.o. male who presents as depressed. Pt recently returned to IOP and stayed 2 sessions and didn't return. Pt reports nothing has changed in his life. He is still on disability, does not have enough $ to live on, has a contentious relationship with his father, has no friends, does not work and is in pain constantly. When questioned about his resistant to change anything and continues to talk about how bad his life is. He does report that for some reason his father is being nicer to him. He invited him over for Easter but pt didn't want to go. Again, asked pt if he wants to continue in therapy and he said he would like to come in and talk about "life.". Pt talks about all he lost due to his previous drug use and beats himself up about it. Processed with pt about being so hard on himself. Processed with pt being more mindful and more present.Pt is still not willing to take any medication. Processed with pt his self care and encourged pt to at least get out and walk. Challenged pt to find a pool he can walk in before next appt.Pt was in agreement of challenge. Will continue to work with pt.      Suicidal/Homicidal: Nowithout intent/plan  Therapist Response: Evaluated pt's current functioning and processed with pt family relationships, boundaries, expectations and processing of stressors.  Plan: Return again in 2 weeks.  Diagnosis: Axis I: Depression, major, recurrent, moderate, Bipolar        MACKENZIE,LISBETH S, LCAS 02/25/16

## 2016-05-03 ENCOUNTER — Other Ambulatory Visit (HOSPITAL_COMMUNITY): Payer: Medicare HMO

## 2016-05-04 ENCOUNTER — Other Ambulatory Visit (HOSPITAL_COMMUNITY): Payer: Medicare HMO

## 2016-05-04 ENCOUNTER — Ambulatory Visit (HOSPITAL_BASED_OUTPATIENT_CLINIC_OR_DEPARTMENT_OTHER): Payer: Medicare HMO | Attending: Cardiology | Admitting: Cardiology

## 2016-05-04 DIAGNOSIS — R0683 Snoring: Secondary | ICD-10-CM | POA: Diagnosis not present

## 2016-05-04 DIAGNOSIS — G4733 Obstructive sleep apnea (adult) (pediatric): Secondary | ICD-10-CM | POA: Diagnosis not present

## 2016-05-05 ENCOUNTER — Other Ambulatory Visit (HOSPITAL_COMMUNITY): Payer: Medicare HMO

## 2016-05-06 ENCOUNTER — Other Ambulatory Visit (HOSPITAL_COMMUNITY): Payer: Medicare HMO

## 2016-05-09 ENCOUNTER — Other Ambulatory Visit (HOSPITAL_COMMUNITY): Payer: Medicare HMO

## 2016-05-10 ENCOUNTER — Telehealth: Payer: Self-pay | Admitting: *Deleted

## 2016-05-10 NOTE — Telephone Encounter (Signed)
-----   Message from Quintella Reichert, MD sent at 05/10/2016  3:05 PM EDT ----- Pt had successful PAP titration. Please setup appointment in 10 weeks. Please let [facility] know that order for PAP is in EPIC.

## 2016-05-10 NOTE — Procedures (Signed)
   Patient Name: Kyle Peterson, Kyle Peterson Date: 05/04/2016 Gender: Male D.O.B: 12-22-1965 Age (years): 56 Referring Provider: Armanda Magic MD, ABSM Height (inches): 68 Interpreting Physician: Armanda Magic MD, ABSM Weight (lbs): 290 RPSGT: Neeriemer, Holly BMI: 44 MRN: [id number] Neck Size: 19.00  CLINICAL INFORMATION The patient is referred for a BiPAP titration to treat sleep apnea.  Date of NPSG, Split Night or HST:10/22/2015  SLEEP STUDY TECHNIQUE As per the AASM Manual for the Scoring of Sleep and Associated Events v2.3 (April 2016) with a hypopnea requiring 4% desaturations.  The channels recorded and monitored were frontal, central and occipital EEG, electrooculogram (EOG), submentalis EMG (chin), nasal and oral airflow, thoracic and abdominal wall motion, anterior tibialis EMG, snore microphone, electrocardiogram, and pulse oximetry. Bilevel positive airway pressure (BPAP) was initiated at the beginning of the study and titrated to treat sleep-disordered breathing.  MEDICATIONS Medications self-administered by patient taken the night of the study : N/A  RESPIRATORY PARAMETERS Optimal IPAP Pressure (cm): 20  AHI at Optimal Pressure (/hr) 0.0 Optimal EPAP Pressure (cm):16   Overall Minimal O2 (%):83.00 Minimal O2 at Optimal Pressure (%): 89.0  SLEEP ARCHITECTURE Start Time:10:55:03 PM  Stop Time:4:57:28 AM  Total Time (min):362.4  Total Sleep Time (min):322.0 Sleep Latency (min):1.5  Sleep Efficiency (%):88.8  REM Latency (min):41.0  WASO (min):38.9 Stage N1 (%): 2.95  Stage N2 (%): 59.16  Stage N3 (%): 0.00  Stage R (%):37.89 Supine (%):25.67  Arousal Index (/hr):23.3      CARDIAC DATA The 2 lead EKG demonstrated sinus rhythm. The mean heart rate was 53.19 beats per minute. Other EKG findings include: None.  LEG MOVEMENT DATA The total Periodic Limb Movements of Sleep (PLMS) were 55. The PLMS index was 10.25. A PLMS index of <15 is considered normal in  adults.  IMPRESSIONS - An optimal PAP pressure was selected for this patient ( 20 /16 of water) - Central sleep apnea was not noted during this titration (CAI = 3.0/h). - Moderate oxygen desaturations were observed during this titration (min O2 = 83.00%). - The patient snored with Soft snoring volume. - No cardiac abnormalities were observed during this study. - Mild periodic limb movements were observed during this study. Arousals associated with PLMs were rare.  DIAGNOSIS - Obstructive Sleep Apnea (327.23 [G47.33 ICD-10])  RECOMMENDATIONS - Trial of BiPAP therapy on 20/16 cm H2O with a Large size Resmed Full Face Mask AirFit F20 mask and heated humidification. - Avoid alcohol, sedatives and other CNS depressants that may worsen sleep apnea and disrupt normal sleep architecture. - Sleep hygiene should be reviewed to assess factors that may improve sleep quality. - Weight management and regular exercise should be initiated or continued. - Return to Sleep Center for re-evaluation after 10 weeks of therapy  Armanda Magic Diplomate, American Board of Sleep Medicine  ELECTRONICALLY SIGNED ON:  05/10/2016, 3:02 PM [facility] PH: [phone]   FX: [phone] ACCREDITED BY THE AMERICAN ACADEMY OF SLEEP MEDICINE

## 2016-05-10 NOTE — Telephone Encounter (Signed)
Informed the patient of his results and recommendations and he verbalized understanding. He understands Dr. Mayford Knife has placed an order for a new cpap. He understands to call if [facility] does not contact him with a new set up in a timely manner. He understands he will be called once confirmation has been received from [facility] that he has received a new machine to schedule 10 week follow up appointment. He was grateful for the call and thanked me. Sent message to [facility] order is in.

## 2016-05-12 ENCOUNTER — Ambulatory Visit (HOSPITAL_COMMUNITY): Payer: Self-pay | Admitting: Licensed Clinical Social Worker

## 2016-05-18 ENCOUNTER — Ambulatory Visit (INDEPENDENT_AMBULATORY_CARE_PROVIDER_SITE_OTHER): Payer: Medicare HMO | Admitting: Family Medicine

## 2016-05-18 ENCOUNTER — Encounter: Payer: Self-pay | Admitting: Family Medicine

## 2016-05-18 VITALS — BP 130/68 | HR 83 | Temp 98.4°F | Ht 68.0 in | Wt 298.6 lb

## 2016-05-18 DIAGNOSIS — G4733 Obstructive sleep apnea (adult) (pediatric): Secondary | ICD-10-CM

## 2016-05-18 DIAGNOSIS — I10 Essential (primary) hypertension: Secondary | ICD-10-CM

## 2016-05-18 DIAGNOSIS — F17200 Nicotine dependence, unspecified, uncomplicated: Secondary | ICD-10-CM | POA: Diagnosis not present

## 2016-05-18 DIAGNOSIS — R739 Hyperglycemia, unspecified: Secondary | ICD-10-CM

## 2016-05-18 LAB — POCT GLYCOSYLATED HEMOGLOBIN (HGB A1C): HEMOGLOBIN A1C: 6.9

## 2016-05-18 NOTE — Assessment & Plan Note (Signed)
Worsened in effort not to gain weight.  We discussed approaches

## 2016-05-18 NOTE — Progress Notes (Signed)
Subjective  Patient is presenting for an exam visit  He overall feels well except he is concerned about his continued smoking and weight gain  Patient reports no  vision/ hearing changes,anorexia, fever ,adenopathy, persistant / recurrent hoarseness, swallowing issues, chest pain, edema,persistant / recurrent cough, hemoptysis, dyspnea(rest, exertional, paroxysmal nocturnal), gastrointestinal  bleeding (melena, rectal bleeding), abdominal pain, excessive heart burn, GU symptoms(dysuria, hematuria, pyuria, voiding/incontinence  Issues) syncope, focal weakness, severe memory loss, concerning skin lesions, depression, anxiety, abnormal bruising/bleeding, major joint swelling.    Chief Complaint noted Review of Symptoms - see HPI PMH - Smoking status noted.     Objective Vital Signs reviewed Ears:  External ear exam shows no significant lesions or deformities.  Otoscopic examination reveals clear canals, tympanic membranes are intact bilaterally without bulging, retraction, inflammation or discharge. Hearing is grossly normal bilaterall Neck:  No deformities, thyromegaly, masses, or tenderness noted.   Supple with full range of motion without pain. Lungs:  Normal respiratory effort, chest expands symmetrically. Lungs are clear to auscultation, no crackles or wheezes. Heart - Regular rate and rhythm.  No murmurs, gallops or rubs.    Abdomen: soft and non-tender without masses, organomegaly or hernias noted.  No guarding or rebound Extremities:  No cyanosis, edema, or deformity noted with good range of motion of all major joints.   Skin:  Intact without suspicious lesions or rashes     Assessments/Plans  No problem-specific Assessment & Plan notes found for this encounter.   See Encounter view if individual problem A/Ps not visible See after visit summary for details of patient instuctions

## 2016-05-18 NOTE — Assessment & Plan Note (Signed)
At goal continue current medications  

## 2016-05-18 NOTE — Assessment & Plan Note (Signed)
Patient does not currently have CPAP but this is being set up this week

## 2016-05-18 NOTE — Patient Instructions (Addendum)
Good to see you today!  Thanks for coming in.  I will call you if your tests are not good.  Otherwise I will send you a letter.  If you do not hear from me with in 2 weeks please call our office.     Smoking is your biggest health factor - discuss with your psychiatrist and counselor  Good luck with the CPAP and the surgery

## 2016-05-20 DIAGNOSIS — G4733 Obstructive sleep apnea (adult) (pediatric): Secondary | ICD-10-CM | POA: Diagnosis not present

## 2016-05-24 ENCOUNTER — Encounter: Payer: Self-pay | Admitting: Family Medicine

## 2016-05-24 DIAGNOSIS — E119 Type 2 diabetes mellitus without complications: Secondary | ICD-10-CM | POA: Insufficient documentation

## 2016-05-29 ENCOUNTER — Encounter (HOSPITAL_COMMUNITY): Payer: Self-pay | Admitting: Emergency Medicine

## 2016-05-29 ENCOUNTER — Emergency Department (HOSPITAL_COMMUNITY)
Admission: EM | Admit: 2016-05-29 | Discharge: 2016-05-29 | Disposition: A | Discharge status: Home / Self Care | Payer: Medicare HMO | Attending: Emergency Medicine | Admitting: Emergency Medicine

## 2016-05-29 ENCOUNTER — Emergency Department (HOSPITAL_COMMUNITY): Payer: Medicare HMO

## 2016-05-29 DIAGNOSIS — Y999 Unspecified external cause status: Secondary | ICD-10-CM | POA: Diagnosis not present

## 2016-05-29 DIAGNOSIS — S0990XA Unspecified injury of head, initial encounter: Secondary | ICD-10-CM | POA: Diagnosis not present

## 2016-05-29 DIAGNOSIS — Y9301 Activity, walking, marching and hiking: Secondary | ICD-10-CM | POA: Diagnosis not present

## 2016-05-29 DIAGNOSIS — I10 Essential (primary) hypertension: Secondary | ICD-10-CM | POA: Diagnosis not present

## 2016-05-29 DIAGNOSIS — Y929 Unspecified place or not applicable: Secondary | ICD-10-CM | POA: Insufficient documentation

## 2016-05-29 DIAGNOSIS — S0081XA Abrasion of other part of head, initial encounter: Secondary | ICD-10-CM | POA: Insufficient documentation

## 2016-05-29 DIAGNOSIS — Z7901 Long term (current) use of anticoagulants: Secondary | ICD-10-CM | POA: Diagnosis not present

## 2016-05-29 DIAGNOSIS — E119 Type 2 diabetes mellitus without complications: Secondary | ICD-10-CM | POA: Diagnosis not present

## 2016-05-29 DIAGNOSIS — I2510 Atherosclerotic heart disease of native coronary artery without angina pectoris: Secondary | ICD-10-CM | POA: Insufficient documentation

## 2016-05-29 DIAGNOSIS — W208XXA Other cause of strike by thrown, projected or falling object, initial encounter: Secondary | ICD-10-CM | POA: Diagnosis not present

## 2016-05-29 NOTE — ED Provider Notes (Signed)
[facility]EMER[facility]ENCY DEPT Provider Note   CSN: [id number] Arrival date & time: 05/29/16  4098     History   Chief Complaint Chief Complaint  Patient presents with  . Head Injury    HPI Kyle Peterson is a 51 y.o. male.  51 year old male here complaining of head injury after a tree branch fell on his scalp spread to arrival. He did sustain a small abrasion to his right frontal scalp region. Denies any loss of consciousness. No blurred vision. No neck pain. Denies any confusion or gait ataxia. He has had no emesis. Patient does take Xarelto and because without came in for further evaluation.      Past Medical History:  Diagnosis Date  . Anxiety   . CAD (coronary artery disease), native coronary artery 08/19/2015  . Depression   . High cholesterol   . Hypertension   . OSA (obstructive sleep apnea)    Severe with AHI 80/hr  . PAF (paroxysmal atrial fibrillation) (HCC) 08/19/2015  . PTSD (post-traumatic stress disorder)   . Substance abuse     Patient Active Problem List   Diagnosis Date Noted  . Diabetes (HCC) 05/24/2016  . Skin induration 02/19/2016  . Depression, major, recurrent, moderate (HCC) 11/05/2015  . Chronic post-traumatic stress disorder (PTSD) 11/05/2015    Class: Chronic  . OSA (obstructive sleep apnea)   . PAF (paroxysmal atrial fibrillation) (HCC) 08/19/2015  . CAD (coronary artery disease), native coronary artery 08/19/2015  . Shoulder pain 06/11/2015  . Hyperlipidemia 06/10/2015  . Tobacco use disorder 05/28/2015  . Essential hypertension 05/27/2015  . Bipolar disorder (HCC) 05/27/2015  . 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 Obstructive sleep apnea 05/27/2015    Past Surgical History:  Procedure Laterality Date  . CARPAL TUNNEL RELEASE Left 11/02/2015  . MYOMECTOMY         Home Medications    Prior to Admission medications   Medication Sig Start Date End Date Taking? Authorizing Provider  CARTIA XT 240 MG 24 hr capsule TAKE 1 CAPSULE  (240 MG TOTAL) BY MOUTH DAILY. 08/27/15  Yes Carney Living, MD  furosemide (LASIX) 20 MG tablet TAKE 1 TABLET (20 MG TOTAL) BY MOUTH DAILY. 04/18/16  Yes Carney Living, MD  losartan (COZAAR) 50 MG tablet TAKE 1 TABLET (50 MG TOTAL) BY MOUTH DAILY. 04/18/16  Yes Carney Living, MD  metoprolol succinate (TOPROL-XL) 50 MG 24 hr tablet TAKE 1 TABLET (50 MG TOTAL) BY MOUTH DAILY. TAKE WITH OR IMMEDIATELY FOLLOWING A MEAL. 04/18/16  Yes Carney Living, MD  orphenadrine (NORFLEX) 100 MG tablet Take 1 tablet (100 mg total) by mouth 2 (two) times daily as needed for muscle spasms. 05/27/15  Yes Carney Living, MD  rivaroxaban (XARELTO) 20 MG TABS tablet Take 1 tablet (20 mg total) by mouth daily with supper. Patient taking differently: Take 20 mg by mouth every morning.  04/22/16  Yes Quintella Reichert, MD    Family History Family History  Problem Relation Age of Onset  . Diabetes Mother   . Hypertension Mother   . Diabetes Father   . Hypertension Father   . Heart disease Father   . Alcohol abuse Father   . Alcohol abuse Brother   . Drug abuse Brother     Social History Social History  Substance Use Topics  . Smoking status: Current Every Day Smoker    Packs/day: 0.50    Types: Cigarettes  . Smokeless tobacco: Never Used     Comment: Trying  to cut back   . Alcohol use No     Allergies   Ace inhibitors   Review of Systems Review of Systems  All other systems reviewed and are negative.    Physical Exam Updated Vital Signs BP (!) 134/106   Pulse 69   Temp 97.9 F (36.6 C)   Resp 16   SpO2 98%   Physical Exam  Constitutional: He is oriented to person, place, and time. He appears well-developed and well-nourished.  Non-toxic appearance. No distress.  HENT:  Head: Normocephalic. Head is with abrasion.    Eyes: Conjunctivae, EOM and lids are normal. Pupils are equal, round, and reactive to light.  Neck: Normal range of motion. Neck supple. No  tracheal deviation present. No thyroid mass present.  Cardiovascular: Normal rate, regular rhythm and normal heart sounds.  Exam reveals no gallop.   No murmur heard. Pulmonary/Chest: Effort normal and breath sounds normal. No stridor. No respiratory distress. He has no decreased breath sounds. He has no wheezes. He has no rhonchi. He has no rales.  Abdominal: Soft. Normal appearance and bowel sounds are normal. He exhibits no distension. There is no tenderness. There is no rebound and no CVA tenderness.  Musculoskeletal: Normal range of motion. He exhibits no edema or tenderness.  Neurological: He is alert and oriented to person, place, and time. He has normal strength. No cranial nerve deficit or sensory deficit. Coordination and gait normal. GCS eye subscore is 4. GCS verbal subscore is 5. GCS motor subscore is 6.  Skin: Skin is warm and dry. No abrasion and no rash noted.  Psychiatric: He has a normal mood and affect. His speech is normal and behavior is normal.  Nursing note and vitals reviewed.    ED Treatments / Results  Labs (all labs ordered are listed, but only abnormal results are displayed) Labs Reviewed - No data to display  EKG  EKG Interpretation None       Radiology No results found.  Procedures Procedures (including critical care time)  Medications Ordered in ED Medications - No data to display   Initial Impression / Assessment and Plan / ED Course  I have reviewed the triage vital signs and the nursing notes.  Pertinent labs & imaging results that were available during my care of the patient were reviewed by me and considered in my medical decision making (see chart for details).     Head ct neg Neuro stable return precations  Final Clinical Impressions(s) / ED Diagnoses   Final diagnoses:  None    New Prescriptions New Prescriptions   No medications on file     Lorre Nick, MD 05/29/16 1020

## 2016-05-29 NOTE — ED Triage Notes (Signed)
Pt states he was walking under a tree and a branch fell on his head. Pt has small red mark on top of head, pt on xarelto.

## 2016-05-29 NOTE — ED Notes (Signed)
Cleaned the pt's head injury and applied bacitracin and a 2x2 dressing, per Dr. Freida Busman.

## 2016-06-19 DIAGNOSIS — G4733 Obstructive sleep apnea (adult) (pediatric): Secondary | ICD-10-CM | POA: Diagnosis not present

## 2016-06-20 ENCOUNTER — Encounter: Payer: Self-pay | Admitting: Cardiology

## 2016-06-20 ENCOUNTER — Ambulatory Visit (INDEPENDENT_AMBULATORY_CARE_PROVIDER_SITE_OTHER): Payer: Medicare HMO | Admitting: Cardiology

## 2016-06-20 VITALS — BP 138/80 | HR 66 | Ht 68.0 in | Wt 302.8 lb

## 2016-06-20 DIAGNOSIS — I422 Other hypertrophic cardiomyopathy: Secondary | ICD-10-CM | POA: Diagnosis not present

## 2016-06-20 DIAGNOSIS — I480 Paroxysmal atrial fibrillation: Secondary | ICD-10-CM

## 2016-06-20 DIAGNOSIS — Z95810 Presence of automatic (implantable) cardiac defibrillator: Secondary | ICD-10-CM

## 2016-06-20 LAB — CUP PACEART INCLINIC DEVICE CHECK
Date Time Interrogation Session: 20180521040000
HIGH POWER IMPEDANCE MEASURED VALUE: 55 Ohm
HIGH POWER IMPEDANCE MEASURED VALUE: 74 Ohm
Implantable Lead Implant Date: 20160218
Implantable Lead Model: 292
Implantable Lead Serial Number: 358834
Implantable Pulse Generator Implant Date: 20160218
Lead Channel Impedance Value: 792 Ohm
Lead Channel Pacing Threshold Amplitude: 0.7 V
Lead Channel Pacing Threshold Pulse Width: 0.5 ms
Lead Channel Sensing Intrinsic Amplitude: 21.1 mV
MDC IDC LEAD LOCATION: 753860
MDC IDC PG SERIAL: 202913
MDC IDC SET LEADCHNL RV PACING AMPLITUDE: 2 V
MDC IDC SET LEADCHNL RV PACING PULSEWIDTH: 0.5 ms
MDC IDC SET LEADCHNL RV SENSING SENSITIVITY: 0.3 mV

## 2016-06-20 NOTE — Patient Instructions (Signed)
Medication Instructions:    Your physician recommends that you continue on your current medications as directed. Please refer to the Current Medication list given to you today.  --- If you need a refill on your cardiac medications before your next appointment, please call your pharmacy. ---  Labwork:  None ordered  Testing/Procedures:  None ordered  Follow-Up: Remote monitoring is used to monitor your Pacemaker of ICD from home. This monitoring reduces the number of office visits required to check your device to one time per year. It allows us to keep an eye on the functioning of your device to ensure it is working properly. You are scheduled for a device check from home on 09/19/2016. You may send your transmission at any time that day. If you have a wireless device, the transmission will be sent automatically. After your physician reviews your transmission, you will receive a postcard with your next transmission date.   Your physician wants you to follow-up in: 1 year with Dr. Elberta Fortisamnitz.  You will receive a reminder letter in the mail two months in advance. If you don't receive a letter, please call our office to schedule the follow-up appointment.  Thank you for choosing [facility]!!   Dory HornSherri Jehad Bisono, RN [phone]

## 2016-06-20 NOTE — Progress Notes (Signed)
Electrophysiology Office Note   Date:  06/20/2016   ID:  Kyle ArnJon Serpe, DOB Jun 02, 1965, MRN [id number]  PCP:  Carney Livinghambliss, Marshall L, MD Primary Electrophysiologist:  Regan LemmingWill Martin Camnitz, MD    Chief Complaint  Patient presents with  . Defib check    PAF/Hypertrophic cardiomyopathy     History of Present Illness: Kyle Peterson is a 51 y.o. male who presents today for electrophysiology evaluation.   He has a history of hypertrophic cardiomyopathy and had a septal myectomy on 02/03/14 the clinic. He has an ICD, noncritical coronary disease, hypertension, hyperlipidemia, paroxysmal atrial fibrillation, sleep apnea not on CPAP, and obesity. He has a history of substance abuse, including heroin, cocaine, and heavy alcohol for 25 years, but he has been clean for the last 10 years. He is also a current smoker. Prior to his operation for HCM, he had experienced an episode of syncope. He also had significant mitral regurgitation prior to his operation.    Today, denies symptoms of palpitations, chest pain, shortness of breath, orthopnea, PND, lower extremity edema, claudication, dizziness, presyncope, syncope, bleeding, or neurologic sequela. The patient is tolerating medications without difficulties and is otherwise without complaint today. He has been gaining weight, but is unable to exercise due to foot pain, and cannot lift weights due to multiple shoulder issues. He is planning on having surgical repair of tendon issues in his shoulder. He otherwise has no cardiac complaints.  Past Medical History:  Diagnosis Date  . Anxiety   . CAD (coronary artery disease), native coronary artery 08/19/2015  . Depression   . High cholesterol   . Hypertension   . OSA (obstructive sleep apnea)    Severe with AHI 80/hr  . PAF (paroxysmal atrial fibrillation) (HCC) 08/19/2015  . PTSD (post-traumatic stress disorder)   . Substance abuse    Past Surgical History:  Procedure Laterality Date  . CARPAL TUNNEL RELEASE  Left 11/02/2015  . MYOMECTOMY       Current Outpatient Prescriptions  Medication Sig Dispense Refill  . CARTIA XT 240 MG 24 hr capsule TAKE 1 CAPSULE (240 MG TOTAL) BY MOUTH DAILY. 90 capsule 3  . furosemide (LASIX) 20 MG tablet TAKE 1 TABLET (20 MG TOTAL) BY MOUTH DAILY. 90 tablet 0  . losartan (COZAAR) 50 MG tablet TAKE 1 TABLET (50 MG TOTAL) BY MOUTH DAILY. 90 tablet 0  . metoprolol succinate (TOPROL-XL) 50 MG 24 hr tablet TAKE 1 TABLET (50 MG TOTAL) BY MOUTH DAILY. TAKE WITH OR IMMEDIATELY FOLLOWING A MEAL. 90 tablet 0  . orphenadrine (NORFLEX) 100 MG tablet Take 1 tablet (100 mg total) by mouth 2 (two) times daily as needed for muscle spasms. 90 tablet 0  . rivaroxaban (XARELTO) 20 MG TABS tablet Take 1 tablet (20 mg total) by mouth daily with supper. (Patient taking differently: Take 20 mg by mouth every morning. ) 90 tablet 1   No current facility-administered medications for this visit.     Allergies:   Ace inhibitors   Social History:  The patient  reports that he has been smoking Cigarettes.  He has been smoking about 0.50 packs per day. He has never used smokeless tobacco. He reports that he uses drugs, including Marijuana, about 3 times per week. He reports that he does not drink alcohol.   Family History:  The patient's family history includes Alcohol abuse in his brother and father; Diabetes in his father and mother; Drug abuse in his brother; Heart disease in his father; Hypertension in his  father and mother.    ROS:  Please see the history of present illness.   Otherwise, review of systems is positive for Disturbance, depression, back pain, anxiety.   All other systems are reviewed and negative.    PHYSICAL EXAM: VS:  BP 138/80   Pulse 66   Ht 5\' 8"  (1.727 m)   Wt (!) 302 lb 12.8 oz (137.3 kg)   BMI 46.04 kg/m  , BMI Body mass index is 46.04 kg/m. GEN: Well nourished, well developed, in no acute distress  HEENT: normal  Neck: no JVD, carotid bruits, or  masses Cardiac: RRR; no murmurs, rubs, or gallops,no edema  Respiratory:  clear to auscultation bilaterally, normal work of breathing GI: soft, nontender, nondistended, + BS MS: no deformity or atrophy  Skin: warm and dry, device site well healed Neuro:  Strength and sensation are intact Psych: euthymic mood, full affect  EKG:  EKG is ordered today. Personal review of the ekg ordered shows A sense, 1 degree AV block, LBBB  Personal review of the device interrogation today. Results in [facility]  Recent Labs: 07/18/2015: ALT 19; B Natriuretic Peptide 133.3; Magnesium 2.0 11/11/2015: BUN 14; Creatinine, Ser 1.20; Hemoglobin 15.0; Platelets 218; Potassium 4.5; Sodium 140    Lipid Panel     Component Value Date/Time   CHOL 211 (H) 06/10/2015 0924   TRIG 200 (H) 06/10/2015 0924   HDL 38 (L) 06/10/2015 0924   CHOLHDL 5.6 (H) 06/10/2015 0924   VLDL 40 (H) 06/10/2015 0924   LDLCALC 133 (H) 06/10/2015 0924     Wt Readings from Last 3 Encounters:  06/20/16 (!) 302 lb 12.8 oz (137.3 kg)  05/18/16 298 lb 9.6 oz (135.4 kg)  05/04/16 297 lb (134.7 kg)      Other studies Reviewed: Additional studies/ records that were reviewed today include: 09/11/14 echocardiogram  Review of the above records today demonstrates:  EF 50-55%. Asymmetric LV septal hypertrophy. Basal anteroseptal 1.8 cm. Hypokinesis of the basal and mid anteroseptal and inferior wall. No significant obstruction of the LVOT. Peak gradient in the LVOT is 13 mmHg. Mild mitral regurgitation. Left atrial cavity is mildly dilated.   ASSESSMENT AND PLAN:  1.  Hypertrophic cardiomyopathy: Status post septal myectomy at the [facility] clinic. Does have a Environmental manager ICD in place with 12 years left on the battery. Not having any further symptoms from his cardiomyopathy. Continue current management.  2. Obstructive sleep apnea: Has recently been fitted with a BiPAP, and has been much more compliant.  3. Paroxysmal atrial  fibrillation: Currently on Xarelto for anticoagulation. In sinus rhythm today. No medication changes at this time.  This patients CHA2DS2-VASc Score and unadjusted Ischemic Stroke Rate (% per year) is equal to 2.2 % stroke rate/year from a score of 2  Above score calculated as 1 point each if present [CHF, HTN, DM, Vascular=MI/PAD/Aortic Plaque, Age if 65-74, or Male] Above score calculated as 2 points each if present [Age > 75, or Stroke/TIA/TE]  Current medicines are reviewed at length with the patient today.   The patient does not have concerns regarding his medicines.  The following changes were made today:    Labs/ tests ordered today include:  Orders Placed This Encounter  Procedures  . EKG 12-Lead     Disposition:   FU with Will Camnitz 1  year  Signed, Will Jorja Loa, MD  06/20/2016 11:04 AM     [facility] [address] [place] [address] [phone] (office) (  [phone] (fax)

## 2016-06-21 ENCOUNTER — Encounter (INDEPENDENT_AMBULATORY_CARE_PROVIDER_SITE_OTHER): Payer: Self-pay | Admitting: Orthopaedic Surgery

## 2016-06-21 ENCOUNTER — Ambulatory Visit (INDEPENDENT_AMBULATORY_CARE_PROVIDER_SITE_OTHER): Payer: Medicare HMO | Admitting: Orthopaedic Surgery

## 2016-06-21 VITALS — BP 146/87 | HR 60 | Ht 68.0 in | Wt 303.0 lb

## 2016-06-21 DIAGNOSIS — M65332 Trigger finger, left middle finger: Secondary | ICD-10-CM | POA: Diagnosis not present

## 2016-06-21 DIAGNOSIS — M75122 Complete rotator cuff tear or rupture of left shoulder, not specified as traumatic: Secondary | ICD-10-CM | POA: Diagnosis not present

## 2016-06-21 MED ORDER — LIDOCAINE HCL 1 % IJ SOLN
0.3000 mL | INTRAMUSCULAR | Status: AC | PRN
  Administered 2016-06-21: .3 mL

## 2016-06-21 MED ORDER — BUPIVACAINE HCL 0.25 % IJ SOLN
0.3300 mL | INTRAMUSCULAR | Status: AC | PRN
  Administered 2016-06-21: .33 mL

## 2016-06-21 MED ORDER — METHYLPREDNISOLONE ACETATE 40 MG/ML IJ SUSP
13.3300 mg | INTRAMUSCULAR | Status: AC | PRN
  Administered 2016-06-21: 13.33 mg

## 2016-06-21 NOTE — Progress Notes (Signed)
Office Visit Note   Patient: Kyle Peterson           Date of Birth: Dec 06, 1965           MRN: [id number] Visit Date: 06/21/2016              Requested by: Carney Livinghambliss, Marshall L, MD [address] [place], [place] [address] PCP: Carney Livinghambliss, Marshall L, MD   Assessment & Plan: Visit Diagnoses:  1. Trigger finger, left middle finger   2. Complete tear of left rotator cuff     Plan: Left long trigger finger A1 pulley injection performed. He will call us once his used to C Pap machine and [facility] with [facility] stay. He has persistent sugar and he'll call and let us know and we can release his A1 pulley at the same time as his shoulder surgery.  Follow-Up Instructions: No Follow-up on file.   Orders:  Orders Placed This Encounter  Procedures  . Hand/Upper Extremity Injection/Arthrocentesis   No orders of the defined types were placed in this encounter.     Procedures: Hand/UE Inj Date/Time: 06/21/2016 2:16 PM Performed by: Eldred MangesYATES, Shayden Bobier C Authorized by: Annell GreeningYATES, Zhane Bluitt C   Condition: trigger finger   Location:  Long finger Site:  L long A1 Needle Size:  25 G Approach:  Volar Ultrasound Guidance: No   Medications:  0.3 mL lidocaine 1 %; 13.33 mg methylPREDNISolone acetate 40 MG/ML; 0.33 mL bupivacaine 0.25 %     Clinical Data: No additional findings.   Subjective: Chief Complaint  Patient presents with  . Left Middle Finger - Pain    HPI patient returns he says a new problem which is been left trigger finger with catching. He is able to demonstrated. He recently got his BiPAP machine for his sleep apnea and as soon as he gets used to this he'll be calling about scheduling for his left shoulder full-thickness rotator cuff repair which continues to bother him.  Review of Systems 14 point review of systems updated from last visit and is unchanged. Of note is his diabetes as well as full-thickness rotator cuff tear left shoulder.  Sleep apnea now with the new C Pap machine and implantable defibrillator left anterior chest wall.   Objective: Vital Signs: BP (!) 146/87   Pulse 60   Ht 5\' 8"  (1.727 m)   Wt (!) 303 lb (137.4 kg)   BMI 46.07 kg/m   Physical Exam  Constitutional: He is oriented to person, place, and time. He appears well-developed and well-nourished.  HENT:  Head: Normocephalic and atraumatic.  Eyes: EOM are normal. Pupils are equal, round, and reactive to light.  Neck: No tracheal deviation present. No thyromegaly present.  Cardiovascular: Normal rate.   Pulmonary/Chest: Effort normal. He has no wheezes.  Abdominal: Soft. Bowel sounds are normal.  Musculoskeletal:  Positive drop arm test on the left consistent with rotator cuff tear. He is able to demonstrate triggering has uses opposite right hand to reduce his left long finger once it pops and flexion. Tenderness over the A1 pulley nodule is palpable.  Neurological: He is alert and oriented to person, place, and time.  Skin: Skin is warm and dry. Capillary refill takes less than 2 seconds.  Psychiatric: He has a normal mood and affect. His behavior is normal. Judgment and thought content normal.    Ortho Exam  Specialty Comments:  No specialty comments available.  Imaging: No results found.  PMFS History: Patient Active Problem List   Diagnosis Date Noted  . Trigger finger, left middle finger 06/21/2016  . Diabetes (HCC) 05/24/2016  . Skin induration 02/19/2016  . Depression, major, recurrent, moderate (HCC) 11/05/2015  . Chronic post-traumatic stress disorder (PTSD) 11/05/2015    Class: Chronic  . OSA (obstructive sleep apnea)   . PAF (paroxysmal atrial fibrillation) (HCC) 08/19/2015  . CAD (coronary artery disease), native coronary artery 08/19/2015  . Shoulder pain 06/11/2015  . Hyperlipidemia 06/10/2015  . Tobacco use disorder 05/28/2015  . Essential hypertension 05/27/2015  . Bipolar disorder (HCC) 05/27/2015  .  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 Obstructive sleep apnea 05/27/2015   Past Medical History:  Diagnosis Date  . Anxiety   . CAD (coronary artery disease), native coronary artery 08/19/2015  . Depression   . High cholesterol   . Hypertension   . OSA (obstructive sleep apnea)    Severe with AHI 80/hr  . PAF (paroxysmal atrial fibrillation) (HCC) 08/19/2015  . PTSD (post-traumatic stress disorder)   . Substance abuse     Family History  Problem Relation Age of Onset  . Diabetes Mother   . Hypertension Mother   . Diabetes Father   . Hypertension Father   . Heart disease Father   . Alcohol abuse Father   . Alcohol abuse Brother   . Drug abuse Brother     Past Surgical History:  Procedure Laterality Date  . CARPAL TUNNEL RELEASE Left 11/02/2015  . MYOMECTOMY     Social History   Occupational History  . Not on file.   Social History Main Topics  . Smoking status: Current Every Day Smoker    Packs/day: 0.50    Types: Cigarettes  . Smokeless tobacco: Never Used     Comment: Trying to cut back   . Alcohol use No  . Drug use: Yes    Frequency: 3.0 times per week    Types: Marijuana  . Sexual activity: Not Currently

## 2016-06-27 ENCOUNTER — Encounter: Payer: Self-pay | Admitting: Cardiology

## 2016-07-01 ENCOUNTER — Telehealth: Payer: Self-pay | Admitting: *Deleted

## 2016-07-01 NOTE — Telephone Encounter (Signed)
-----   Message from Traci R Turner, MD sent at 06/30/2016 11:37 PM EDT ----- Good AHI on CPAP but needs to improve compliance 

## 2016-07-01 NOTE — Telephone Encounter (Signed)
Informed patient of CPAP compliance results and patient understanding was verbalized. Patient understands he needs to improve his compliance. Patient explained his sleeping disorder keeps him from sleeping at times and he will forget to wear his mask. Patient will be [place] to [place] and wonders if his cpap will transmit there. Patient was advised to wear his cpap each day

## 2016-07-04 NOTE — Telephone Encounter (Signed)
Patient has a 10 week sleep follow up appointment scheduled for 07-20-2016 at 10 am. Patient compliance range is 06-20-2016 -08-18-2016.

## 2016-07-19 ENCOUNTER — Encounter (INDEPENDENT_AMBULATORY_CARE_PROVIDER_SITE_OTHER): Payer: Self-pay | Admitting: Orthopaedic Surgery

## 2016-07-19 ENCOUNTER — Ambulatory Visit (INDEPENDENT_AMBULATORY_CARE_PROVIDER_SITE_OTHER): Payer: Medicare HMO | Admitting: Orthopaedic Surgery

## 2016-07-19 DIAGNOSIS — M65332 Trigger finger, left middle finger: Secondary | ICD-10-CM

## 2016-07-19 NOTE — Progress Notes (Signed)
Patient was seen 06/21/16 for trigger finger left hand middle finger. We were going to inject his finger but he had to leave as an emergency due to a family matter and left before the injection was performed. He returned today and injection was performed he was still triggering he tolerated the injection well. CT from 5/22 dictation for details on the injection. If he has persistent triggering after the injection he'll let us know and we applied a dorsal splint to the DIP joint that he'll wear for a week or so and then removed. He continues to trigger he will call us.

## 2016-07-20 ENCOUNTER — Encounter: Payer: Self-pay | Admitting: Cardiology

## 2016-07-20 ENCOUNTER — Ambulatory Visit (INDEPENDENT_AMBULATORY_CARE_PROVIDER_SITE_OTHER): Payer: Medicare HMO | Admitting: Cardiology

## 2016-07-20 ENCOUNTER — Telehealth (INDEPENDENT_AMBULATORY_CARE_PROVIDER_SITE_OTHER): Payer: Self-pay | Admitting: Orthopaedic Surgery

## 2016-07-20 VITALS — BP 130/82 | HR 66 | Ht 68.0 in | Wt 299.4 lb

## 2016-07-20 DIAGNOSIS — R011 Cardiac murmur, unspecified: Secondary | ICD-10-CM | POA: Insufficient documentation

## 2016-07-20 DIAGNOSIS — I4819 Other persistent atrial fibrillation: Secondary | ICD-10-CM

## 2016-07-20 DIAGNOSIS — I77810 Thoracic aortic ectasia: Secondary | ICD-10-CM

## 2016-07-20 DIAGNOSIS — E7800 Pure hypercholesterolemia, unspecified: Secondary | ICD-10-CM

## 2016-07-20 DIAGNOSIS — I739 Peripheral vascular disease, unspecified: Secondary | ICD-10-CM

## 2016-07-20 DIAGNOSIS — I2510 Atherosclerotic heart disease of native coronary artery without angina pectoris: Secondary | ICD-10-CM

## 2016-07-20 DIAGNOSIS — I422 Other hypertrophic cardiomyopathy: Secondary | ICD-10-CM | POA: Diagnosis not present

## 2016-07-20 DIAGNOSIS — G4733 Obstructive sleep apnea (adult) (pediatric): Secondary | ICD-10-CM | POA: Diagnosis not present

## 2016-07-20 DIAGNOSIS — I10 Essential (primary) hypertension: Secondary | ICD-10-CM | POA: Diagnosis not present

## 2016-07-20 DIAGNOSIS — I481 Persistent atrial fibrillation: Secondary | ICD-10-CM

## 2016-07-20 NOTE — Telephone Encounter (Signed)
Please advise 

## 2016-07-20 NOTE — Patient Instructions (Signed)
Medication Instructions:  Your physician recommends that you continue on your current medications as directed. Please refer to the Current Medication list given to you today.   Labwork: Your physician recommends that you return for FASTING lab work the same day as your ECHO.  Testing/Procedures: Your physician has requested that you have an echocardiogram. Echocardiography is a painless test that uses sound waves to create images of your heart. It provides your doctor with information about the size and shape of your heart and how well your heart's chambers and valves are working. This procedure takes approximately one hour. There are no restrictions for this procedure.   Your physician has requested that you have a lower extremity arterial duplex. During this test, ultrasound is  used to evaluate arterial blood flow in the legs. Allow one hour for this exam. There are no restrictions or special instructions.   Follow-Up: Your physician wants you to follow-up in: 6 months with Dr. Norris Crossurner's assistant. You will receive a reminder letter in the mail two months in advance. If you don't receive a letter, please call our office to schedule the follow-up appointment.   Your physician wants you to follow-up in: 1 year with Dr. Mayford Knifeurner. You will receive a reminder letter in the mail two months in advance. If you don't receive a letter, please call our office to schedule the follow-up appointment.   Any Other Special Instructions Will Be Listed Below (If Applicable).     If you need a refill on your cardiac medications before your next appointment, please call your pharmacy.

## 2016-07-20 NOTE — Telephone Encounter (Signed)
Patient called and said that the cortizone shot he received yesterday for his trigger finger is really painful and its still locking up. CB # [phone]

## 2016-07-20 NOTE — Progress Notes (Signed)
Cardiology Office Note    Date:  07/20/2016   ID:  Le Faulcon, DOB 11-20-1965, MRN [id number]  PCP:  Carney Living, MD  Cardiologist:  Armanda Magic, MD   Chief Complaint  Patient presents with  . Sleep Apnea  . Hypertension    History of Present Illness:  Carron Jaggi is a 51 y.o. male with a history of nonobstructive CAD, HTN, HOCM s/p septal myectomy s/p AICD, hyperlipidemia, polysubstance abuse with heroin, cocaine and ETOH for 25 years but has been clean for over 10 yearsand PAF who is here for followup of OSA after starting BiPAP.  He has had excessive daytime sleepiness, fatigue, snoring and witnessed apneas.  Sleep study showed severe OSA with an AHI of 80.8/hr and underwent CPAP titration but due to ongoing events could not be titrated on CPAP and ultimately was placed on BiPAP at 20/16cm H2O.  He is doing well with his BiPAP device but is having problems staying compliant.  He tolerates the full face mask without any problems and feels the pressure is adequate.  Since going on PAP he feels more rested in the morning and has less daytime sleepiness.  He does not know if he snores with the BIPAP.  He has some mouth dryness if he forgets to use the water in his device.  He denies any SOB or DOE.  Occasionally he will notice some discomfort in his chest that he describes as a "pain" over his left breast lasting seconds in a row and then stops.  He denies any nausea or diaphoresis and there is no radiation of the pain.  It is nonexertional.  He has mild LE edema on occasion.  He has noticed that he gets cramping in his calves when he walks in the am.  He denies any dizziness, palpitations or syncope.   Past Medical History:  Diagnosis Date  . Anxiety   . CAD (coronary artery disease), native coronary artery 08/19/2015  . Depression   . High cholesterol   . Hypertension   . OSA (obstructive sleep apnea)    Severe with AHI 80/hr  . PAF (paroxysmal atrial fibrillation) (HCC)  08/19/2015  . PTSD (post-traumatic stress disorder)   . Substance abuse     Past Surgical History:  Procedure Laterality Date  . CARPAL TUNNEL RELEASE Left 11/02/2015  . MYOMECTOMY      Current Medications: Current Meds  Medication Sig  . CARTIA XT 240 MG 24 hr capsule TAKE 1 CAPSULE (240 MG TOTAL) BY MOUTH DAILY.  . furosemide (LASIX) 20 MG tablet TAKE 1 TABLET (20 MG TOTAL) BY MOUTH DAILY.  Marland Kitchen losartan (COZAAR) 50 MG tablet TAKE 1 TABLET (50 MG TOTAL) BY MOUTH DAILY.  . metoprolol succinate (TOPROL-XL) 50 MG 24 hr tablet TAKE 1 TABLET (50 MG TOTAL) BY MOUTH DAILY. TAKE WITH OR IMMEDIATELY FOLLOWING A MEAL.  . orphenadrine (NORFLEX) 100 MG tablet Take 1 tablet (100 mg total) by mouth 2 (two) times daily as needed for muscle spasms.  . rivaroxaban (XARELTO) 20 MG TABS tablet Take 1 tablet (20 mg total) by mouth daily with supper. (Patient taking differently: Take 20 mg by mouth every morning. )    Allergies:   Ace inhibitors   Social History   Social History  . Marital status: Divorced    Spouse name: N/A  . Number of children: N/A  . Years of education: N/A   Social History Main Topics  . Smoking status: Current Every Day Smoker  Packs/day: 0.50    Types: Cigarettes  . Smokeless tobacco: Never Used     Comment: Trying to cut back   . Alcohol use No  . Drug use: Yes    Frequency: 3.0 times per week    Types: Marijuana  . Sexual activity: Not Currently   Other Topics Concern  . None   Social History Narrative   Recently moved to [facility]   Father is in [place].   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   ROS:   Please see the history of present illness.    ROS All other systems reviewed and are  negative.  No flowsheet data found.     PHYSICAL EXAM:   VS:  BP 130/82   Pulse 66   Ht 5\' 8"  (1.727 m)   Wt 299 lb 6.4 oz (135.8 kg)   SpO2 98%   BMI 45.52 kg/m    GEN: Well nourished, well developed, in no acute distress  HEENT: normal  Neck: no JVD, carotid bruits, or masses Cardiac: RRR; no murmurs, rubs, or gallops,no edema.  Intact distal pulses bilaterally.  Respiratory:  clear to auscultation bilaterally, normal work of breathing GI: soft, nontender, nondistended, + BS MS: no deformity or atrophy  Skin: warm and dry, no rash Neuro:  Alert and Oriented x 3, Strength and sensation are intact Psych: euthymic mood, full affect  Wt Readings from Last 3 Encounters:  07/20/16 299 lb 6.4 oz (135.8 kg)  06/21/16 (!) 303 lb (137.4 kg)  06/20/16 (!) 302 lb 12.8 oz (137.3 kg)      Studies/Labs Reviewed:   EKG:  EKG is not ordered today.   Recent Labs: 11/11/2015: BUN 14; Creatinine, Ser 1.20; Hemoglobin 15.0; Platelets 218; Potassium 4.5; Sodium 140   Lipid Panel    Component Value Date/Time   CHOL 211 (H) 06/10/2015 0924   TRIG 200 (H) 06/10/2015 0924   HDL 38 (L) 06/10/2015 0924   CHOLHDL 5.6 (H) 06/10/2015 0924   VLDL 40 (H) 06/10/2015 0924   LDLCALC 133 (H) 06/10/2015 0981    Additional studies/ records that were reviewed today include:  CPAP download3    ASSESSMENT:    1. OSA (obstructive sleep apnea)   2. Hypertrophic cardiomyopathy (HCC)   3. Essential hypertension   4. Persistent atrial fibrillation (HCC)   5. Pure hypercholesterolemia   6. Coronary artery disease involving native coronary artery of native heart without angina pectoris   7. Dilated aortic root (HCC)   8. Claudication (HCC)   9. Heart murmur      PLAN:  In order of problems listed above:  1.  OSA - the patient is tolerating PAP therapy well without any problems. The PAP download was reviewed today and showed an AHI of 1/hr on 20/16 cm H2O with 37% compliance in using more  than 4 hours nightly.  The patient has been using and benefiting from PAP use and will continue to benefit from therapy.  I encouraged him to be more compliant with his BIPAP  2.  HOCM s/p myectomy and AICE - echo a year ago was stable.  He is followed by Dr. Elberta Fortis for AICD.  Continue BB and CCB.  3.  HTN - BP stable on current medical regimen.  Continue BB/CCB and ARB  4.  Persistent atrial fibrillation - he is maintaining NSR.  He will continue Xarelto 20mg  daily as well as Cardizem XT 240mg  daily and Toprol XL 50mg  daily.    Check CBC and BMET.  5.  Dyslipidemia - I will get an FLP and ALT as he is not on a statin at this time.  6.  Nonobstructive ASCAD - he has had some CP that is somewhat atypical but he has a hard time explaining it.  He is getting several surgeries in the near future so I will get a stress myoview to rule out ischemia.   He is not on ASA due to NOAC. He is not on a statin.  I will check his FLP.  7.  Borderline mildly dilated aortic root - I will repeat echo for stability.   8.  Claudication - I will get LE arterial dopplers to rule out PVD  9.  Systolic heart murmur at apex ? MR - repeat echo to assess    Medication Adjustments/Labs and Tests Ordered: Current medicines are reviewed at length with the patient today.  Concerns regarding medicines are outlined above.  Medication changes, Labs and Tests ordered today are listed in the Patient Instructions below.  There are no Patient Instructions on file for this visit.   Signed, Armanda Magicraci Mehtab Dolberry, MD  07/20/2016 10:00 AM    [facility] [address], [place], [place]  [address] Phone: [phone]; Fax: [phone]

## 2016-07-21 ENCOUNTER — Other Ambulatory Visit: Payer: Self-pay | Admitting: Cardiology

## 2016-07-21 DIAGNOSIS — I739 Peripheral vascular disease, unspecified: Secondary | ICD-10-CM

## 2016-07-21 NOTE — Telephone Encounter (Signed)
Please add trigger finger release to surgery on shoulder. Will do shoulder first then trigger finger same anesthetic. Trigger finger injection with still locking. ucall pt and let him know. I discussed it with him at his last visit. Thanks.

## 2016-07-21 NOTE — Telephone Encounter (Signed)
I left voicemail for patient advising Dr. Ophelia CharterYates was going to add this to his shoulder surgery.

## 2016-07-22 ENCOUNTER — Other Ambulatory Visit (INDEPENDENT_AMBULATORY_CARE_PROVIDER_SITE_OTHER): Payer: Self-pay | Admitting: Orthopaedic Surgery

## 2016-07-22 DIAGNOSIS — M75122 Complete rotator cuff tear or rupture of left shoulder, not specified as traumatic: Secondary | ICD-10-CM

## 2016-07-25 ENCOUNTER — Other Ambulatory Visit: Payer: Self-pay | Admitting: Family Medicine

## 2016-07-26 ENCOUNTER — Encounter (HOSPITAL_COMMUNITY): Payer: Self-pay

## 2016-07-26 ENCOUNTER — Encounter (HOSPITAL_COMMUNITY)
Admission: RE | Admit: 2016-07-26 | Discharge: 2016-07-26 | Disposition: A | Discharge status: Home / Self Care | Payer: Medicare HMO | Source: Ambulatory Visit | Attending: Orthopaedic Surgery | Admitting: Orthopaedic Surgery

## 2016-07-26 ENCOUNTER — Encounter (HOSPITAL_COMMUNITY): Payer: Self-pay | Admitting: Vascular Surgery

## 2016-07-26 DIAGNOSIS — Z8673 Personal history of transient ischemic attack (TIA), and cerebral infarction without residual deficits: Secondary | ICD-10-CM | POA: Diagnosis not present

## 2016-07-26 DIAGNOSIS — E119 Type 2 diabetes mellitus without complications: Secondary | ICD-10-CM | POA: Insufficient documentation

## 2016-07-26 DIAGNOSIS — F4310 Post-traumatic stress disorder, unspecified: Secondary | ICD-10-CM | POA: Diagnosis not present

## 2016-07-26 DIAGNOSIS — I509 Heart failure, unspecified: Secondary | ICD-10-CM | POA: Insufficient documentation

## 2016-07-26 DIAGNOSIS — M65332 Trigger finger, left middle finger: Secondary | ICD-10-CM | POA: Diagnosis not present

## 2016-07-26 DIAGNOSIS — I110 Hypertensive heart disease with heart failure: Secondary | ICD-10-CM | POA: Diagnosis not present

## 2016-07-26 DIAGNOSIS — I252 Old myocardial infarction: Secondary | ICD-10-CM | POA: Insufficient documentation

## 2016-07-26 DIAGNOSIS — Z01818 Encounter for other preprocedural examination: Secondary | ICD-10-CM | POA: Insufficient documentation

## 2016-07-26 DIAGNOSIS — Z7901 Long term (current) use of anticoagulants: Secondary | ICD-10-CM | POA: Insufficient documentation

## 2016-07-26 DIAGNOSIS — Z95810 Presence of automatic (implantable) cardiac defibrillator: Secondary | ICD-10-CM | POA: Insufficient documentation

## 2016-07-26 DIAGNOSIS — Z79899 Other long term (current) drug therapy: Secondary | ICD-10-CM | POA: Diagnosis not present

## 2016-07-26 DIAGNOSIS — M75102 Unspecified rotator cuff tear or rupture of left shoulder, not specified as traumatic: Secondary | ICD-10-CM | POA: Insufficient documentation

## 2016-07-26 DIAGNOSIS — I480 Paroxysmal atrial fibrillation: Secondary | ICD-10-CM | POA: Insufficient documentation

## 2016-07-26 DIAGNOSIS — E7800 Pure hypercholesterolemia, unspecified: Secondary | ICD-10-CM | POA: Insufficient documentation

## 2016-07-26 DIAGNOSIS — I2510 Atherosclerotic heart disease of native coronary artery without angina pectoris: Secondary | ICD-10-CM | POA: Diagnosis not present

## 2016-07-26 DIAGNOSIS — G4733 Obstructive sleep apnea (adult) (pediatric): Secondary | ICD-10-CM | POA: Diagnosis not present

## 2016-07-26 DIAGNOSIS — Z01812 Encounter for preprocedural laboratory examination: Secondary | ICD-10-CM | POA: Diagnosis not present

## 2016-07-26 HISTORY — DX: Type 2 diabetes mellitus without complications: E11.9

## 2016-07-26 HISTORY — DX: Acute myocardial infarction, unspecified: I21.9

## 2016-07-26 HISTORY — DX: Cardiac murmur, unspecified: R01.1

## 2016-07-26 HISTORY — DX: Angina pectoris, unspecified: I20.9

## 2016-07-26 HISTORY — DX: Heart failure, unspecified: I50.9

## 2016-07-26 HISTORY — DX: Unspecified osteoarthritis, unspecified site: M19.90

## 2016-07-26 HISTORY — DX: Cerebral infarction, unspecified: I63.9

## 2016-07-26 LAB — COMPREHENSIVE METABOLIC PANEL
ALT: 22 U/L (ref 17–63)
AST: 22 U/L (ref 15–41)
Albumin: 3.8 g/dL (ref 3.5–5.0)
Alkaline Phosphatase: 91 U/L (ref 38–126)
Anion gap: 8 (ref 5–15)
BILIRUBIN TOTAL: 0.3 mg/dL (ref 0.3–1.2)
BUN: 11 mg/dL (ref 6–20)
CHLORIDE: 106 mmol/L (ref 101–111)
CO2: 23 mmol/L (ref 22–32)
CREATININE: 1 mg/dL (ref 0.61–1.24)
Calcium: 8.5 mg/dL — ABNORMAL LOW (ref 8.9–10.3)
Glucose, Bld: 109 mg/dL — ABNORMAL HIGH (ref 65–99)
Potassium: 3.9 mmol/L (ref 3.5–5.1)
Sodium: 137 mmol/L (ref 135–145)
TOTAL PROTEIN: 6.4 g/dL — AB (ref 6.5–8.1)

## 2016-07-26 LAB — CBC
HEMATOCRIT: 44.7 % (ref 39.0–52.0)
Hemoglobin: 14.5 g/dL (ref 13.0–17.0)
MCH: 26.1 pg (ref 26.0–34.0)
MCHC: 32.4 g/dL (ref 30.0–36.0)
MCV: 80.5 fL (ref 78.0–100.0)
PLATELETS: 226 10*3/uL (ref 150–400)
RBC: 5.55 MIL/uL (ref 4.22–5.81)
RDW: 16.4 % — AB (ref 11.5–15.5)
WBC: 9.5 10*3/uL (ref 4.0–10.5)

## 2016-07-26 LAB — GLUCOSE, CAPILLARY: GLUCOSE-CAPILLARY: 106 mg/dL — AB (ref 65–99)

## 2016-07-26 LAB — SURGICAL PCR SCREEN
MRSA, PCR: POSITIVE — AB
STAPHYLOCOCCUS AUREUS: POSITIVE — AB

## 2016-07-26 NOTE — Progress Notes (Signed)
Consulted with a zelenak pa re:  New diabetic, on bipap 1 month,icd, no instructions on d/c xarelto. New echo ordered but not until week after surgery. No stress done.Revonda Standard. Allison to talk with dr Ophelia Charteryates.

## 2016-07-26 NOTE — Pre-Procedure Instructions (Addendum)
Mariana ArnJon Micheli  07/26/2016      [facility] [address], [place] - [address] [place] [address] Phone: [phone] Fax: [phone]  [facility] [place], [place] - [address] [place] [id number] Phone: [phone] Fax: [phone]    Your procedure is scheduled on 08/01/16  [facility] at 930 A.M.  Call this number if you have problems the morning of surgery:  [phone]   Remember:  Do not eat food or drink liquids after midnight.  Take these medicines the morning of surgery with A SIP OF WATER     cartia xt, metoprolol,orphenadrine(norflex)  If needed  STOP all herbel meds, nsaids (aleve,naproxen,advil,ibuprofen) prior to surgery starting Today 07/26/16 including all vitamns/supplements,aspirin   Do not wear jewelry, make-up or nail polish.  Do not wear lotions, powders, or perfumes, or deoderant.  Do not shave 48 hours prior to surgery.  Men may shave face and neck.  Do not bring valuables to the hospital.  [facility] is not responsible for any belongings or valuables.  Contacts, dentures or bridgework may not be worn into surgery.  Leave your suitcase in the car.  After surgery it may be brought to your room.  For patients admitted to the hospital, discharge time will be determined by your treatment team.  Patients discharged the day of surgery will not be allowed to drive home.   Special instructions:   Special Instructions: [place] - Preparing for Surgery  Before surgery, you can play an important role.  Because skin is not sterile, your skin needs to be as free of germs as possible.  You can reduce the number of germs on you skin by washing with CHG (chlorahexidine gluconate) soap before surgery.  CHG is an antiseptic cleaner which kills germs and bonds with the skin to continue killing germs even after washing.  Please DO NOT use if you  have an allergy to CHG or antibacterial soaps.  If your skin becomes reddened/irritated stop using the CHG and inform your nurse when you arrive at Short Stay.  Do not shave (including legs and underarms) for at least 48 hours prior to the first CHG shower.  You may shave your face.  Please follow these instructions carefully:   1.  Shower with CHG Soap the night before surgery and the morning of Surgery.  2.  If you choose to wash your hair, wash your hair first as usual with your normal shampoo.  3.  After you shampoo, rinse your hair and body thoroughly to remove the Shampoo.  4.  Use CHG as you would any other liquid soap.  You can apply chg directly  to the skin and wash gently with scrungie or a clean washcloth.  5.  Apply the CHG Soap to your body ONLY FROM THE NECK DOWN.  Do not use on open wounds or open sores.  Avoid contact with your eyes ears, mouth and genitals (private parts).  Wash genitals (private parts)       with your normal soap.  6.  Wash thoroughly, paying special attention to the area where your surgery will be performed.  7.  Thoroughly rinse your body with warm water from the neck down.  8.  DO NOT shower/wash with your normal soap after using and rinsing off the CHG Soap.  9.  Pat yourself dry with a clean towel.  10.  Wear clean pajamas.            11.  Place clean sheets on your bed the night of your first shower and do not sleep with pets.  Day of Surgery  Do not apply any lotions/deodorants the morning of surgery.  Please wear clean clothes to the hospital/surgery center.  Please read over the  fact sheets that you were given.

## 2016-07-27 ENCOUNTER — Encounter (HOSPITAL_COMMUNITY): Payer: Self-pay

## 2016-07-27 LAB — HEMOGLOBIN A1C
HEMOGLOBIN A1C: 7 % — AB (ref 4.8–5.6)
Mean Plasma Glucose: 154 mg/dL

## 2016-07-27 NOTE — Progress Notes (Addendum)
Anesthesia Chart Review: Patient is a 51 year old male scheduled for left shoulder arthroscopy with open rotator cuff repair, left hand long finger trigger release on 08/01/16 by Dr. Ophelia Charter.  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 BMI is consistent with morbid obesity.  - PCP is listed as Dr. Pearlean Brownie. - Primary cardiologist is Dr. Armanda Magic, last visit 07/20/16. Apparently, he had some atypical chest pain in the recent past. Because he had several upcoming surgeries she recommended he get a stress Myoview to rule out ischemia. She also recommended an echo to re-evaluate borderline mildly dilated aortic root and systolic murmur. (Her last echo was 08/20/15 showing normal EF, mild MR, 37 mm aortic root with one year repeat recommended.) - EP cardiologist is Dr. Loman Brooklyn, last visit 06/20/16 with one year follow-up recommended. Last interrogation 06/17/16 with normal device function, He was not pacer dependent. For this procedure, EP recommended tachy-therapies be disabled prior to surgery and will need return to normal function post-operatively.  Meds include Cartia XT, Lasix, losartan, Toprol-XL, Norflex, Xarelto. He reported that he had not yet been instructed on when to hold his Xarelto.  BP (!) 142/77   Pulse 66   Temp 36.9 C   Resp 20   Ht 5\' 8"  (1.727 m)   Wt (!) 301 lb (136.5 kg)   SpO2 97%   BMI 45.77 kg/m   EKG  06/20/16: SR with first degree AV block, possible left atrial enlargement, LAD, left bundle branch block. He has known history of left BBB.   Echo 08/20/15: Study Conclusions - Left ventricle: There was moderate concentric hypertrophy.   Systolic function was normal. The estimated ejection fraction was   in the range of 55% to 60%. Features are consistent with a   pseudonormal left ventricular filling pattern, with concomitant   abnormal relaxation and increased filling pressure (grade 2   diastolic dysfunction). - Aortic valve: Mildly thickened, mildly calcified leaflets. - Aorta: Aortic root dimension: 37 mm (ED). - Aortic root: The aortic root was mildly dilated. - Mitral valve: There was mild regurgitation.  Cardiac cath 01/13/14 [facility]; Care Everywhere): The left main trunk is normal. The left anterior descending artery has mild narrowing in the proximal and mid  third. The left circumflex artery has minimal tubular narrowing in the mid third. There  is a 30% narrowing of the ostial first obtuse marginal branch. The right coronary artery is dominant. There is a mild narrowing in the proximal  third and a 40% narrowing in the distal right coronary artery before the  bifurcation. A/P: Mild to moderate coronary artery disease as described above. No indication  for coronary artery bypass grafting at the time of septectomy. Recommend vigorous  medical therapy with high dose statin therapy and risk factor modification.  Preoperative labs noted. Cr 1.00. H/H 14.5/44.7. Non-fasting glucose 109. A1c 7.0. He is for PT/INR on the day of surgery.   I notified Elnita Maxwell at Dr. Ophelia Charter' office on 07/26/16 that patient needed Xarelto instructions and also that Dr. Mayford Knife had recommended a stress  test prior to upcoming surgeries (and echo also ordered). Patient will need these issues addressed/cardiac clearance sorted out prior to elective surgery.   Velna Ochsllison Zelenak, PA-C [facility] Short [facility]  Center/Anesthesiology Phone [phone] 07/27/2016 11:25 AM

## 2016-08-01 ENCOUNTER — Encounter (HOSPITAL_COMMUNITY): Admission: RE | Payer: Self-pay | Source: Ambulatory Visit

## 2016-08-01 ENCOUNTER — Ambulatory Visit (HOSPITAL_COMMUNITY): Admission: RE | Admit: 2016-08-01 | Payer: Medicare HMO | Source: Ambulatory Visit | Admitting: Orthopaedic Surgery

## 2016-08-01 SURGERY — ARTHROSCOPY, SHOULDER, WITH ROTATOR CUFF REPAIR
Anesthesia: General | Laterality: Left

## 2016-08-08 ENCOUNTER — Ambulatory Visit (HOSPITAL_COMMUNITY)
Admission: RE | Admit: 2016-08-08 | Discharge: 2016-08-08 | Disposition: A | Discharge status: Home / Self Care | Payer: Medicare HMO | Source: Ambulatory Visit | Attending: Cardiovascular Disease | Admitting: Cardiovascular Disease

## 2016-08-08 ENCOUNTER — Ambulatory Visit (HOSPITAL_BASED_OUTPATIENT_CLINIC_OR_DEPARTMENT_OTHER): Payer: Medicare HMO

## 2016-08-08 ENCOUNTER — Other Ambulatory Visit: Payer: Self-pay

## 2016-08-08 ENCOUNTER — Other Ambulatory Visit: Payer: Medicare HMO

## 2016-08-08 DIAGNOSIS — I481 Persistent atrial fibrillation: Secondary | ICD-10-CM | POA: Diagnosis not present

## 2016-08-08 DIAGNOSIS — I4819 Other persistent atrial fibrillation: Secondary | ICD-10-CM

## 2016-08-08 DIAGNOSIS — I051 Rheumatic mitral insufficiency: Secondary | ICD-10-CM | POA: Diagnosis not present

## 2016-08-08 DIAGNOSIS — I739 Peripheral vascular disease, unspecified: Secondary | ICD-10-CM | POA: Diagnosis not present

## 2016-08-08 DIAGNOSIS — I5030 Unspecified diastolic (congestive) heart failure: Secondary | ICD-10-CM | POA: Diagnosis not present

## 2016-08-08 DIAGNOSIS — I77810 Thoracic aortic ectasia: Secondary | ICD-10-CM | POA: Diagnosis not present

## 2016-08-08 DIAGNOSIS — E7800 Pure hypercholesterolemia, unspecified: Secondary | ICD-10-CM | POA: Diagnosis not present

## 2016-08-08 DIAGNOSIS — I420 Dilated cardiomyopathy: Secondary | ICD-10-CM | POA: Diagnosis not present

## 2016-08-08 LAB — HEPATIC FUNCTION PANEL
ALBUMIN: 4 g/dL (ref 3.5–5.5)
ALT: 18 IU/L (ref 0–44)
AST: 17 IU/L (ref 0–40)
Alkaline Phosphatase: 90 IU/L (ref 39–117)
BILIRUBIN TOTAL: 0.4 mg/dL (ref 0.0–1.2)
Bilirubin, Direct: 0.11 mg/dL (ref 0.00–0.40)
TOTAL PROTEIN: 6.5 g/dL (ref 6.0–8.5)

## 2016-08-08 LAB — LIPID PANEL
CHOL/HDL RATIO: 4.6 ratio (ref 0.0–5.0)
Cholesterol, Total: 167 mg/dL (ref 100–199)
HDL: 36 mg/dL — AB (ref >39)
LDL CALC: 110 mg/dL — AB (ref 0–99)
TRIGLYCERIDES: 103 mg/dL (ref 0–149)
VLDL CHOLESTEROL CAL: 21 mg/dL (ref 5–40)

## 2016-08-08 LAB — BASIC METABOLIC PANEL
BUN/Creatinine Ratio: 13 (ref 9–20)
BUN: 14 mg/dL (ref 6–24)
CALCIUM: 8.6 mg/dL — AB (ref 8.7–10.2)
CHLORIDE: 104 mmol/L (ref 96–106)
CO2: 22 mmol/L (ref 20–29)
Creatinine, Ser: 1.08 mg/dL (ref 0.76–1.27)
GFR calc Af Amer: 92 mL/min/{1.73_m2} (ref >59)
GFR, EST NON AFRICAN AMERICAN: 80 mL/min/{1.73_m2} (ref >59)
GLUCOSE: 124 mg/dL — AB (ref 65–99)
Potassium: 3.9 mmol/L (ref 3.5–5.2)
Sodium: 142 mmol/L (ref 134–144)

## 2016-08-09 ENCOUNTER — Telehealth: Payer: Self-pay | Admitting: Cardiology

## 2016-08-09 DIAGNOSIS — I2510 Atherosclerotic heart disease of native coronary artery without angina pectoris: Secondary | ICD-10-CM

## 2016-08-09 DIAGNOSIS — E785 Hyperlipidemia, unspecified: Secondary | ICD-10-CM

## 2016-08-09 NOTE — Telephone Encounter (Signed)
Follow UP:    Returning your call from today.

## 2016-08-09 NOTE — Telephone Encounter (Signed)
-----   Message from Quintella Reichertraci R Turner, MD sent at 08/08/2016  2:58 PM EDT ----- Echo showed normal LVF with EF 55-60% with moderate LVH, increased stiffness of heart muscle, mild MR and mild LAE

## 2016-08-09 NOTE — Telephone Encounter (Signed)
Pt was scheduled for RC repair and trigger finger release which did not respond to injection still triggering. Cheryl at my office was getting cardiology clearance then resheduling once cleared.

## 2016-08-09 NOTE — Telephone Encounter (Signed)
Please see note from last month - he was supposed to be set up for nuclear stress test for clearance but I do not see that it was scheduled

## 2016-08-09 NOTE — Telephone Encounter (Signed)
-----   Message from Quintella Reichertraci R Turner, MD sent at 08/08/2016  7:07 PM EDT ----- Normal blood flow in LE

## 2016-08-09 NOTE — Telephone Encounter (Signed)
-----   Message from Quintella Reichertraci R Turner, MD sent at 08/08/2016  3:40 PM EDT ----- Is not at goal  Please find out if patient has ever been on a statin

## 2016-08-09 NOTE — Telephone Encounter (Signed)
Informed patient of test results and verbal understanding expressed.  While on the phone, he states he has planned surgery coming up and would like for Cardiology to speak with Dr. Ophelia CharterYates for clearance.  Dr. Ophelia CharterYates - there is a cancelled rotator cuff repair in EPIC. The patient mentioned there might be a trigger finger procedure as well. Please confirm procedures for clearance.

## 2016-08-10 MED ORDER — ATORVASTATIN CALCIUM 20 MG PO TABS: 20.0000 mg | ORAL_TABLET | Freq: Every day | ORAL | 11 refills | Status: DC

## 2016-08-10 NOTE — Telephone Encounter (Signed)
Instructed patient to START ATORVASTATIN 20 mg daily. FLP and ALT scheduled September 10. Patient agrees with treatment plan.  Patient agrees to nuclear stress test. He understands his test will be over 2 days.  Instructions reviewed and he has no questions. He understands he will be called to schedule.

## 2016-08-10 NOTE — Telephone Encounter (Signed)
-----   Message from Quintella Reichertraci R Turner, MD sent at 08/09/2016  1:18 PM EDT ----- Start Atorvastatin 20mg  daily and repeat FLP and ALT in 6 weeks

## 2016-08-11 ENCOUNTER — Telehealth (HOSPITAL_COMMUNITY): Payer: Self-pay | Admitting: *Deleted

## 2016-08-11 NOTE — Telephone Encounter (Signed)
Patient given detailed instructions per Myocardial Perfusion Study Information Sheet for the test on 08/15/16 Patient notified to arrive 15 minutes early and that it is imperative to arrive on time for appointment to keep from having the test rescheduled.  If you need to cancel or reschedule your appointment, please call the office within 24 hours of your appointment. . Patient verbalized understanding. Shanae Luo Jacqueline    

## 2016-08-11 NOTE — Telephone Encounter (Signed)
Stress test has been scheduled 7/16.

## 2016-08-15 ENCOUNTER — Ambulatory Visit (HOSPITAL_COMMUNITY): Payer: Medicare HMO | Attending: Cardiovascular Disease

## 2016-08-15 DIAGNOSIS — I2510 Atherosclerotic heart disease of native coronary artery without angina pectoris: Secondary | ICD-10-CM | POA: Diagnosis not present

## 2016-08-15 DIAGNOSIS — R079 Chest pain, unspecified: Secondary | ICD-10-CM | POA: Insufficient documentation

## 2016-08-15 DIAGNOSIS — E785 Hyperlipidemia, unspecified: Secondary | ICD-10-CM

## 2016-08-15 MED ORDER — TECHNETIUM TC 99M TETROFOSMIN IV KIT
33.0000 | PACK | Freq: Once | INTRAVENOUS | Status: AC | PRN
  Administered 2016-08-15: 33 via INTRAVENOUS
  Filled 2016-08-15: qty 33

## 2016-08-16 ENCOUNTER — Inpatient Hospital Stay (INDEPENDENT_AMBULATORY_CARE_PROVIDER_SITE_OTHER): Payer: Medicare HMO | Admitting: Orthopaedic Surgery

## 2016-08-16 ENCOUNTER — Ambulatory Visit (HOSPITAL_COMMUNITY): Payer: Medicare HMO | Attending: Cardiology

## 2016-08-16 DIAGNOSIS — R079 Chest pain, unspecified: Secondary | ICD-10-CM | POA: Diagnosis not present

## 2016-08-16 LAB — MYOCARDIAL PERFUSION IMAGING
CHL CUP NUCLEAR SDS: 2
CHL CUP RESTING HR STRESS: 68 {beats}/min
CSEPPHR: 88 {beats}/min
LV dias vol: 211 mL (ref 62–150)
LVSYSVOL: 109 mL
RATE: 0.31
SRS: 2
SSS: 4
TID: 0.42

## 2016-08-16 MED ORDER — TECHNETIUM TC 99M TETROFOSMIN IV KIT
33.0000 | PACK | Freq: Once | INTRAVENOUS | Status: AC | PRN
  Administered 2016-08-16: 33 via INTRAVENOUS
  Filled 2016-08-16: qty 33

## 2016-08-16 MED ORDER — REGADENOSON 0.4 MG/5ML IV SOLN
0.4000 mg | Freq: Once | INTRAVENOUS | Status: AC
  Administered 2016-08-16: 0.4 mg via INTRAVENOUS

## 2016-08-19 DIAGNOSIS — G4733 Obstructive sleep apnea (adult) (pediatric): Secondary | ICD-10-CM | POA: Diagnosis not present

## 2016-08-24 DIAGNOSIS — G4733 Obstructive sleep apnea (adult) (pediatric): Secondary | ICD-10-CM | POA: Diagnosis not present

## 2016-08-26 ENCOUNTER — Other Ambulatory Visit (INDEPENDENT_AMBULATORY_CARE_PROVIDER_SITE_OTHER): Payer: Self-pay | Admitting: Orthopaedic Surgery

## 2016-08-26 DIAGNOSIS — M75122 Complete rotator cuff tear or rupture of left shoulder, not specified as traumatic: Secondary | ICD-10-CM

## 2016-09-02 NOTE — Pre-Procedure Instructions (Signed)
Mariana ArnJon Weltman  09/02/2016      [facility] [address] [place]- [place], [place] - [address] [place] [id number] Phone: [phone] Fax: [phone]  [facility] [place], [place] - [address] [place] [id number] Phone: [phone] Fax: [phone]    Your procedure is scheduled on August 10  [facility] Ad[facility] at [phone] A.M.  Call this number if you have problems the morning of surgery:  [phone]   Remember:  Do not eat food or drink liquids after midnight.  Continue all other medications as directed by your physician except follow these instructions about you medications    Take these medicines the morning of surgery with A SIP OF WATER metoprolol succinate (TOPROL-XL) orphenadrine (NORFLEX)   7 days prior to surgery STOP taking any Aspirin, Aleve, Naproxen, Ibuprofen, Motrin, Advil, Goody's, BC's, all herbal medications, fish oil, and all vitamins  Follow physicians instructions about XARELTO   Do not wear jewelry  Do not wear lotions, powders, or cologne, or deoderant.  Men may shave face and neck.  Do not bring valuables to the hospital.  [facility] is not responsible for any belongings or valuables.  Contacts, dentures or bridgework may not be worn into surgery.  Leave your suitcase in the car.  After surgery it may be brought to your room.  For patients admitted to the hospital, discharge time will be determined by your treatment team.  Patients discharged the day of surgery will not be allowed to drive home.    Special instructions:   [place]- Preparing For Surgery  Before surgery, you can play an important role. Because skin is not sterile, your skin needs to be as free of germs as possible. You can reduce the number of germs on your skin by washing with CHG (chlorahexidine gluconate) Soap before surgery.  CHG is an antiseptic cleaner which kills germs  and bonds with the skin to continue killing germs even after washing.  Please do not use if you have an allergy to CHG or antibacterial soaps. If your skin becomes reddened/irritated stop using the CHG.  Do not shave (including legs and underarms) for at least 48 hours prior to first CHG shower. It is OK to shave your face.  Please follow these instructions carefully.   1. Shower the NIGHT BEFORE SURGERY and the MORNING OF SURGERY with CHG.   2. If you chose to wash your hair, wash your hair first as usual with your normal shampoo.  3. After you shampoo, rinse your hair and body thoroughly to remove the shampoo.  4. Use CHG as you would any other liquid soap. You can apply CHG directly to the skin and wash gently with a scrungie or a clean washcloth.   5. Apply the CHG Soap to your body ONLY FROM THE NECK DOWN.  Do not use on open wounds or open sores. Avoid contact with your eyes, ears, mouth and genitals (private parts). Wash genitals (private parts) with your normal soap.  6. Wash thoroughly, paying special attention to the area where your surgery will be performed.  7. Thoroughly rinse your body with warm water from the neck down.  8. DO NOT shower/wash with your normal soap after using and rinsing off the CHG Soap.  9. Pat yourself dry with a CLEAN TOWEL.   10. Wear CLEAN PAJAMAS   11. Place CLEAN SHEETS on your bed  the night of your first shower and DO NOT SLEEP WITH PETS.    Day of Surgery: Do not apply any deodorants/lotions. Please wear clean clothes to the hospital/surgery center.      Please read over the following fact sheets that you were given.

## 2016-09-05 ENCOUNTER — Encounter (HOSPITAL_COMMUNITY)
Admission: RE | Admit: 2016-09-05 | Discharge: 2016-09-05 | Disposition: A | Discharge status: Home / Self Care | Payer: Medicare HMO | Source: Ambulatory Visit | Attending: Orthopaedic Surgery | Admitting: Orthopaedic Surgery

## 2016-09-05 ENCOUNTER — Encounter (HOSPITAL_COMMUNITY): Payer: Self-pay

## 2016-09-05 DIAGNOSIS — Z7901 Long term (current) use of anticoagulants: Secondary | ICD-10-CM | POA: Insufficient documentation

## 2016-09-05 DIAGNOSIS — Z79899 Other long term (current) drug therapy: Secondary | ICD-10-CM | POA: Insufficient documentation

## 2016-09-05 DIAGNOSIS — Z01812 Encounter for preprocedural laboratory examination: Secondary | ICD-10-CM | POA: Diagnosis not present

## 2016-09-05 HISTORY — DX: Presence of automatic (implantable) cardiac defibrillator: Z95.810

## 2016-09-05 LAB — COMPREHENSIVE METABOLIC PANEL
ALT: 22 U/L (ref 17–63)
ANION GAP: 8 (ref 5–15)
AST: 23 U/L (ref 15–41)
Albumin: 3.8 g/dL (ref 3.5–5.0)
Alkaline Phosphatase: 92 U/L (ref 38–126)
BILIRUBIN TOTAL: 0.4 mg/dL (ref 0.3–1.2)
BUN: 16 mg/dL (ref 6–20)
CO2: 24 mmol/L (ref 22–32)
Calcium: 8.4 mg/dL — ABNORMAL LOW (ref 8.9–10.3)
Chloride: 109 mmol/L (ref 101–111)
Creatinine, Ser: 1.26 mg/dL — ABNORMAL HIGH (ref 0.61–1.24)
Glucose, Bld: 137 mg/dL — ABNORMAL HIGH (ref 65–99)
POTASSIUM: 4 mmol/L (ref 3.5–5.1)
Sodium: 141 mmol/L (ref 135–145)
TOTAL PROTEIN: 6.9 g/dL (ref 6.5–8.1)

## 2016-09-05 LAB — CBC
HEMATOCRIT: 45.6 % (ref 39.0–52.0)
Hemoglobin: 14.7 g/dL (ref 13.0–17.0)
MCH: 26.1 pg (ref 26.0–34.0)
MCHC: 32.2 g/dL (ref 30.0–36.0)
MCV: 80.9 fL (ref 78.0–100.0)
Platelets: 221 10*3/uL (ref 150–400)
RBC: 5.64 MIL/uL (ref 4.22–5.81)
RDW: 16.9 % — AB (ref 11.5–15.5)
WBC: 11.3 10*3/uL — ABNORMAL HIGH (ref 4.0–10.5)

## 2016-09-05 LAB — SURGICAL PCR SCREEN
MRSA, PCR: NEGATIVE
STAPHYLOCOCCUS AUREUS: NEGATIVE

## 2016-09-05 LAB — GLUCOSE, CAPILLARY: GLUCOSE-CAPILLARY: 127 mg/dL — AB (ref 65–99)

## 2016-09-05 NOTE — Progress Notes (Signed)
Form faxed to Dr Elberta Fortisamnitz.Also joey notified with BS

## 2016-09-06 NOTE — Progress Notes (Signed)
Anesthesia Chart Review: Patient is a 51 year old male scheduled for left shoulder arthroscopy with mini open rotator cuff repair on 09/09/16 by Dr. Ophelia CharterYates. Surgery was initially planned for 08/01/16, but was postponed due to need for additional cardiac testing.   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 BMI is consistent with morbid obesity.  - PCP is listed as Dr. Pearlean BrownieMarshall Chambliss. - Primary cardiologist is Dr. Armanda Magicraci Turner, last visit 07/20/16. Apparently, he had some atypical chest pain in the recent past. Because he had several upcoming surgeries she recommended he get a stress Myoview to rule out ischemia. She also recommended an echo to re-evaluate borderline mildly dilated aortic root and systolic murmur. (See results below.)  - EP cardiologist is Dr. Loman BrooklynWill Camnitz, last visit 06/20/16 with one year follow-up recommended. Last interrogation 06/17/16 with normal device function, He was not pacer dependent. For this procedure, EP recommended tachy-therapies be disabled prior to surgery and will need return to normal function post-operatively.  Meds include Lipitor, Cartia XT, Lasix, losartan, Toprol-XL, Norflex, Xarelto. By PAT RN notations, he reported being instructed to hold Xarelto for 2 days prior to surgery.   BP 137/85   Pulse 62   Temp 36.7 C   Resp 20   Ht 5\' 8"  (1.727 m)   Wt 295 lb 4.8 oz (133.9 kg)   SpO2 100%   BMI  44.90 kg/m   EKG 06/20/16: SR with first degree AV block, possible left atrial enlargement, LAD, left bundle branch block. He has known history of left BBB.   Nuclear stress test 08/16/16:  The left ventricular ejection fraction is mildly decreased (45-54%).  Nuclear stress EF: 49%. Inferior wall hypokinesis. Increased wall thickness noted.  There was no ST segment deviation noted during stress.  This is a low risk study. No ischemia identified.  Echo 08/08/16: Study Conclusions - Left ventricle: The cavity size was normal. There was moderate   concentric hypertrophy. Systolic function was normal. The   estimated ejection fraction was in the range of 55% to 60%. There   was no dynamic obstruction. Wall motion was normal; there were no   regional wall motion abnormalities. Features are consistent with   a pseudonormal left ventricular filling pattern, with concomitant   abnormal relaxation and increased filling pressure (grade 2   diastolic dysfunction). - Ventricular septum: Septal motion showed paradox. These changes   are consistent with right ventricular pacing. - Mitral valve: There was mild regurgitation. - Left atrium: The atrium was mildly dilated. - Aorta: The aortic root and ascending aorta were normal in size.  Cardiac cath 01/13/14 [facility]; Care Everywhere): The left main trunk is normal. The left anterior descending artery has mild narrowing in the proximal and mid  third. The left circumflex artery has minimal tubular narrowing in the mid third. There  is a 30% narrowing of the ostial first obtuse marginal branch. The right coronary artery is dominant. There is a mild narrowing in the proximal  third and a 40% narrowing in the distal right coronary artery before the  bifurcation. A/P: Mild to moderate coronary artery disease as described above. No indication  for coronary artery bypass grafting at the time of septectomy. Recommend vigorous  medical  therapy with high dose statin therapy and risk factor modification.  Preoperative labs noted. Cr 1.26. H/H 14.7/45.6. Non-fasting glucose 137. A1c 7.0 on 07/26/16. He is for PT/INR on the day of surgery.   Recent stress test was non-ischemic with echo showing normal EF and LV wall motion with no dynamic obstruction. If no acute changes then I would anticipate that he can proceed as planned. ICD perioperative device form from EP recommended [employer] disable tachy therapies during surgery and return to normal progreamming after procedure.  Velna Ochs [facility] Short Stay Center/Anesthesiology Phone [phone] 09/06/2016 1:21 PM

## 2016-09-08 MED ORDER — VANCOMYCIN HCL 10 G IV SOLR
1500.0000 mg | INTRAVENOUS | Status: AC
  Administered 2016-09-09: 1500 mg via INTRAVENOUS
  Filled 2016-09-08: qty 1500

## 2016-09-08 NOTE — H&P (Signed)
Mariana ArnJon Raven is an 51 y.o. male.   Chief Complaint: Left shoulder pain and rotator cuff tear HPI: 51 year old male history of shoulder pain and rotator cuff tear presents to the hospital for surgical intervention. Progressively worsening pain with failed response with conservative treatment.  Past Medical History:  Diagnosis Date  . AICD (automatic cardioverter/defibrillator) present    bostan scien  . Anginal pain (HCC)    ?heart pain   per dr Estill Doomscamntiz  . Anxiety   . Arthritis    ddd  . CAD (coronary artery disease), native coronary artery 08/19/2015  . CHF (congestive heart failure) (HCC)   . Depression   . Diabetes mellitus without complication (HCC)    no meds  new dx  . Heart murmur   . High cholesterol   . Hypertension   . Myocardial infarction (HCC)   . OSA (obstructive sleep apnea)    Severe with AHI 80/hr- new bipap  . PAF (paroxysmal atrial fibrillation) (HCC) 08/19/2015  . PTSD (post-traumatic stress disorder)   . Stroke (HCC) 2016  . Substance abuse     Past Surgical History:  Procedure Laterality Date  . CARDIAC CATHETERIZATION     01/13/14 [facility]: LM NL, mild narrowing of proximal/mid LAD, mid LCX, proximal RCA. 30% ostial OM1. 40% distal RCA. Medical tx.   . CARPAL TUNNEL RELEASE Left 11/02/2015  . CERVICAL DISCECTOMY    . MYOMECTOMY    . TONSILLECTOMY      Family History  Problem Relation Age of Onset  . Diabetes Mother   . Hypertension Mother   . Diabetes Father   . Hypertension Father   . Heart disease Father   . Alcohol abuse Father   . Alcohol abuse Brother   . Drug abuse Brother    Social History:  reports that he has been smoking Cigarettes.  He has a 20.00 pack-year smoking history. He has never used smokeless tobacco. He reports that he uses drugs, including Marijuana, about 3 times per week. He reports that he does not drink alcohol.  Allergies:  Allergies  Allergen Reactions  . Ace Inhibitors Shortness Of Breath and Cough    No  prescriptions prior to admission.    No results found for this or any previous visit (from the past 48 hour(s)). No results found.  Review of Systems  Constitutional: Negative.   HENT: Negative.   Respiratory: Negative.   Cardiovascular: Negative.   Gastrointestinal: Negative.   Genitourinary: Negative.   Musculoskeletal: Positive for joint pain.  Skin: Negative.   Neurological: Negative.   Psychiatric/Behavioral: Negative.     There were no vitals taken for this visit. Physical Exam  Constitutional: He is oriented to person, place, and time. No distress.  Eyes: Pupils are equal, round, and reactive to light. EOM are normal.  Neck: Normal range of motion.  GI: He exhibits no distension.  Musculoskeletal:  Positive drop arm test on the left consistent with rotator cuff tear. He is able to demonstrate triggering has uses opposite right hand to reduce his left long finger once it pops and flexion. Tenderness over the A1 pulley nodule is palpable.   Neurological: He is alert and oriented to person, place, and time.  Skin: Skin is warm and dry.  Psychiatric: He has a normal mood and affect.     Assessment/Plan  left shoulder rotator cuff tear  We'll proceed with left shoulder arthroscopy with debridement and mini open rotator cuff repair. Surgical procedure along with possible risks/complications and  recovery/rehabilitation time discussed. All questions answered. Zonia Kief, PA-C 09/08/2016, 5:24 PM

## 2016-09-09 ENCOUNTER — Ambulatory Visit (HOSPITAL_COMMUNITY): Payer: Medicare HMO | Admitting: Emergency Medicine

## 2016-09-09 ENCOUNTER — Encounter (HOSPITAL_COMMUNITY)
Admission: RE | Disposition: A | Discharge status: Home / Self Care | Payer: Self-pay | Source: Ambulatory Visit | Attending: Orthopaedic Surgery

## 2016-09-09 ENCOUNTER — Ambulatory Visit (HOSPITAL_COMMUNITY): Payer: Medicare HMO | Admitting: Anesthesiology

## 2016-09-09 ENCOUNTER — Encounter (HOSPITAL_COMMUNITY): Payer: Self-pay | Admitting: *Deleted

## 2016-09-09 ENCOUNTER — Ambulatory Visit (HOSPITAL_COMMUNITY)
Admission: RE | Admit: 2016-09-09 | Discharge: 2016-09-10 | Disposition: A | Discharge status: Home / Self Care | Payer: Medicare HMO | Source: Ambulatory Visit | Attending: Orthopaedic Surgery | Admitting: Orthopaedic Surgery

## 2016-09-09 DIAGNOSIS — I509 Heart failure, unspecified: Secondary | ICD-10-CM | POA: Diagnosis not present

## 2016-09-09 DIAGNOSIS — Z813 Family history of other psychoactive substance abuse and dependence: Secondary | ICD-10-CM | POA: Insufficient documentation

## 2016-09-09 DIAGNOSIS — I10 Essential (primary) hypertension: Secondary | ICD-10-CM | POA: Diagnosis not present

## 2016-09-09 DIAGNOSIS — I2510 Atherosclerotic heart disease of native coronary artery without angina pectoris: Secondary | ICD-10-CM | POA: Insufficient documentation

## 2016-09-09 DIAGNOSIS — F329 Major depressive disorder, single episode, unspecified: Secondary | ICD-10-CM | POA: Diagnosis not present

## 2016-09-09 DIAGNOSIS — E119 Type 2 diabetes mellitus without complications: Secondary | ICD-10-CM | POA: Diagnosis not present

## 2016-09-09 DIAGNOSIS — F4310 Post-traumatic stress disorder, unspecified: Secondary | ICD-10-CM | POA: Diagnosis not present

## 2016-09-09 DIAGNOSIS — I480 Paroxysmal atrial fibrillation: Secondary | ICD-10-CM | POA: Insufficient documentation

## 2016-09-09 DIAGNOSIS — Z8249 Family history of ischemic heart disease and other diseases of the circulatory system: Secondary | ICD-10-CM | POA: Insufficient documentation

## 2016-09-09 DIAGNOSIS — I110 Hypertensive heart disease with heart failure: Secondary | ICD-10-CM | POA: Insufficient documentation

## 2016-09-09 DIAGNOSIS — G8918 Other acute postprocedural pain: Secondary | ICD-10-CM | POA: Diagnosis not present

## 2016-09-09 DIAGNOSIS — Z8673 Personal history of transient ischemic attack (TIA), and cerebral infarction without residual deficits: Secondary | ICD-10-CM | POA: Insufficient documentation

## 2016-09-09 DIAGNOSIS — Z811 Family history of alcohol abuse and dependence: Secondary | ICD-10-CM | POA: Diagnosis not present

## 2016-09-09 DIAGNOSIS — E7800 Pure hypercholesterolemia, unspecified: Secondary | ICD-10-CM | POA: Diagnosis not present

## 2016-09-09 DIAGNOSIS — Z888 Allergy status to other drugs, medicaments and biological substances status: Secondary | ICD-10-CM | POA: Insufficient documentation

## 2016-09-09 DIAGNOSIS — Z833 Family history of diabetes mellitus: Secondary | ICD-10-CM | POA: Insufficient documentation

## 2016-09-09 DIAGNOSIS — E1151 Type 2 diabetes mellitus with diabetic peripheral angiopathy without gangrene: Secondary | ICD-10-CM | POA: Diagnosis not present

## 2016-09-09 DIAGNOSIS — G4733 Obstructive sleep apnea (adult) (pediatric): Secondary | ICD-10-CM | POA: Insufficient documentation

## 2016-09-09 DIAGNOSIS — I252 Old myocardial infarction: Secondary | ICD-10-CM | POA: Insufficient documentation

## 2016-09-09 DIAGNOSIS — M75100 Unspecified rotator cuff tear or rupture of unspecified shoulder, not specified as traumatic: Secondary | ICD-10-CM | POA: Diagnosis present

## 2016-09-09 DIAGNOSIS — M75122 Complete rotator cuff tear or rupture of left shoulder, not specified as traumatic: Secondary | ICD-10-CM | POA: Diagnosis not present

## 2016-09-09 DIAGNOSIS — F17210 Nicotine dependence, cigarettes, uncomplicated: Secondary | ICD-10-CM | POA: Diagnosis not present

## 2016-09-09 DIAGNOSIS — Z9889 Other specified postprocedural states: Secondary | ICD-10-CM | POA: Diagnosis not present

## 2016-09-09 DIAGNOSIS — Z79899 Other long term (current) drug therapy: Secondary | ICD-10-CM | POA: Insufficient documentation

## 2016-09-09 DIAGNOSIS — Z7901 Long term (current) use of anticoagulants: Secondary | ICD-10-CM | POA: Diagnosis not present

## 2016-09-09 DIAGNOSIS — Z95810 Presence of automatic (implantable) cardiac defibrillator: Secondary | ICD-10-CM | POA: Insufficient documentation

## 2016-09-09 DIAGNOSIS — M75102 Unspecified rotator cuff tear or rupture of left shoulder, not specified as traumatic: Secondary | ICD-10-CM | POA: Diagnosis not present

## 2016-09-09 HISTORY — PX: SHOULDER ARTHROSCOPY WITH ROTATOR CUFF REPAIR: SHX5685

## 2016-09-09 LAB — GLUCOSE, CAPILLARY
GLUCOSE-CAPILLARY: 125 mg/dL — AB (ref 65–99)
GLUCOSE-CAPILLARY: 110 mg/dL — AB (ref 65–99)
GLUCOSE-CAPILLARY: 136 mg/dL — AB (ref 65–99)
Glucose-Capillary: 127 mg/dL — ABNORMAL HIGH (ref 65–99)
Glucose-Capillary: 175 mg/dL — ABNORMAL HIGH (ref 65–99)

## 2016-09-09 SURGERY — ARTHROSCOPY, SHOULDER, WITH ROTATOR CUFF REPAIR
Anesthesia: Regional | Site: Shoulder | Laterality: Left

## 2016-09-09 MED ORDER — CEFAZOLIN SODIUM-DEXTROSE 1-4 GM/50ML-% IV SOLN
1.0000 g | Freq: Three times a day (TID) | INTRAVENOUS | Status: AC
  Administered 2016-09-09: 1 g via INTRAVENOUS
  Filled 2016-09-09: qty 50

## 2016-09-09 MED ORDER — DILTIAZEM HCL ER COATED BEADS 120 MG PO CP24: 240.0000 mg | ORAL_CAPSULE | Freq: Every day | ORAL | Status: DC

## 2016-09-09 MED ORDER — FENTANYL CITRATE (PF) 250 MCG/5ML IJ SOLN
INTRAMUSCULAR | Status: AC
  Filled 2016-09-09: qty 5

## 2016-09-09 MED ORDER — METHOCARBAMOL 500 MG PO TABS: 500.0000 mg | ORAL_TABLET | Freq: Four times a day (QID) | ORAL | 0 refills | Status: DC | PRN

## 2016-09-09 MED ORDER — SODIUM CHLORIDE 0.9 % IV SOLN: INTRAVENOUS | Status: DC

## 2016-09-09 MED ORDER — LACTATED RINGERS IV SOLN
INTRAVENOUS | Status: DC
  Administered 2016-09-09: 08:00:00 via INTRAVENOUS

## 2016-09-09 MED ORDER — OXYCODONE HCL 5 MG PO TABS
5.0000 mg | ORAL_TABLET | ORAL | Status: DC | PRN
  Administered 2016-09-09 – 2016-09-10 (×3): 10 mg via ORAL
  Filled 2016-09-09 (×3): qty 2

## 2016-09-09 MED ORDER — POLYETHYLENE GLYCOL 3350 17 G PO PACK: 17.0000 g | PACK | Freq: Every day | ORAL | Status: DC | PRN

## 2016-09-09 MED ORDER — ONDANSETRON HCL 4 MG/2ML IJ SOLN: 4.0000 mg | Freq: Four times a day (QID) | INTRAMUSCULAR | Status: DC | PRN

## 2016-09-09 MED ORDER — ONDANSETRON HCL 4 MG/2ML IJ SOLN
INTRAMUSCULAR | Status: DC | PRN
  Administered 2016-09-09: 4 mg via INTRAVENOUS

## 2016-09-09 MED ORDER — ACETAMINOPHEN 650 MG RE SUPP: 650.0000 mg | Freq: Four times a day (QID) | RECTAL | Status: DC | PRN

## 2016-09-09 MED ORDER — CHLORHEXIDINE GLUCONATE 4 % EX LIQD: 60.0000 mL | Freq: Once | CUTANEOUS | Status: DC

## 2016-09-09 MED ORDER — LIDOCAINE 2% (20 MG/ML) 5 ML SYRINGE
INTRAMUSCULAR | Status: AC
  Filled 2016-09-09: qty 10

## 2016-09-09 MED ORDER — METOCLOPRAMIDE HCL 5 MG PO TABS: 5.0000 mg | ORAL_TABLET | Freq: Three times a day (TID) | ORAL | Status: DC | PRN

## 2016-09-09 MED ORDER — PHENYLEPHRINE HCL 10 MG/ML IJ SOLN
INTRAVENOUS | Status: DC | PRN
  Administered 2016-09-09: 25 ug/min via INTRAVENOUS

## 2016-09-09 MED ORDER — ACETAMINOPHEN 325 MG PO TABS
650.0000 mg | ORAL_TABLET | Freq: Four times a day (QID) | ORAL | Status: DC | PRN
  Administered 2016-09-09: 650 mg via ORAL
  Filled 2016-09-09: qty 2

## 2016-09-09 MED ORDER — ONDANSETRON HCL 4 MG/2ML IJ SOLN
INTRAMUSCULAR | Status: AC
  Filled 2016-09-09: qty 4

## 2016-09-09 MED ORDER — SODIUM CHLORIDE 0.9 % IR SOLN
Status: DC | PRN
  Administered 2016-09-09: 3000 mL

## 2016-09-09 MED ORDER — SUCCINYLCHOLINE CHLORIDE 20 MG/ML IJ SOLN
INTRAMUSCULAR | Status: DC | PRN
  Administered 2016-09-09: 120 mg via INTRAVENOUS

## 2016-09-09 MED ORDER — FENTANYL CITRATE (PF) 100 MCG/2ML IJ SOLN
INTRAMUSCULAR | Status: DC | PRN
  Administered 2016-09-09: 150 ug via INTRAVENOUS

## 2016-09-09 MED ORDER — MIDAZOLAM HCL 2 MG/2ML IJ SOLN
INTRAMUSCULAR | Status: AC
  Administered 2016-09-09: 2 mg via INTRAVENOUS
  Filled 2016-09-09: qty 2

## 2016-09-09 MED ORDER — LIDOCAINE HCL (CARDIAC) 20 MG/ML IV SOLN
INTRAVENOUS | Status: DC | PRN
  Administered 2016-09-09: 100 mg via INTRATRACHEAL

## 2016-09-09 MED ORDER — FENTANYL CITRATE (PF) 100 MCG/2ML IJ SOLN
50.0000 ug | Freq: Once | INTRAMUSCULAR | Status: AC
  Administered 2016-09-09: 50 ug via INTRAVENOUS

## 2016-09-09 MED ORDER — DOCUSATE SODIUM 100 MG PO CAPS: 100.0000 mg | ORAL_CAPSULE | Freq: Two times a day (BID) | ORAL | Status: DC

## 2016-09-09 MED ORDER — SUCCINYLCHOLINE CHLORIDE 200 MG/10ML IV SOSY
PREFILLED_SYRINGE | INTRAVENOUS | Status: AC
  Filled 2016-09-09: qty 10

## 2016-09-09 MED ORDER — KETAMINE HCL 10 MG/ML IJ SOLN
INTRAMUSCULAR | Status: DC | PRN
  Administered 2016-09-09: 60 mg via INTRAVENOUS

## 2016-09-09 MED ORDER — LOSARTAN POTASSIUM 50 MG PO TABS: 50.0000 mg | ORAL_TABLET | Freq: Every day | ORAL | Status: DC

## 2016-09-09 MED ORDER — KETAMINE HCL-SODIUM CHLORIDE 100-0.9 MG/10ML-% IV SOSY
PREFILLED_SYRINGE | INTRAVENOUS | Status: AC
  Filled 2016-09-09: qty 10

## 2016-09-09 MED ORDER — BUPIVACAINE-EPINEPHRINE (PF) 0.25% -1:200000 IJ SOLN
INTRAMUSCULAR | Status: AC
  Filled 2016-09-09: qty 30

## 2016-09-09 MED ORDER — BUPIVACAINE-EPINEPHRINE 0.25% -1:200000 IJ SOLN: INTRAMUSCULAR | Status: DC | PRN

## 2016-09-09 MED ORDER — ONDANSETRON HCL 4 MG/2ML IJ SOLN
INTRAMUSCULAR | Status: AC
  Filled 2016-09-09: qty 2

## 2016-09-09 MED ORDER — ATORVASTATIN CALCIUM 20 MG PO TABS: 20.0000 mg | ORAL_TABLET | Freq: Every day | ORAL | Status: DC

## 2016-09-09 MED ORDER — METOPROLOL SUCCINATE ER 25 MG PO TB24: 50.0000 mg | ORAL_TABLET | Freq: Every day | ORAL | Status: DC

## 2016-09-09 MED ORDER — ROCURONIUM BROMIDE 10 MG/ML (PF) SYRINGE
PREFILLED_SYRINGE | INTRAVENOUS | Status: AC
  Filled 2016-09-09: qty 10

## 2016-09-09 MED ORDER — ETOMIDATE 2 MG/ML IV SOLN
INTRAVENOUS | Status: DC | PRN
  Administered 2016-09-09: 12 mg via INTRAVENOUS

## 2016-09-09 MED ORDER — OXYCODONE-ACETAMINOPHEN 5-325 MG PO TABS: 1.0000 | ORAL_TABLET | Freq: Four times a day (QID) | ORAL | 0 refills | Status: DC | PRN

## 2016-09-09 MED ORDER — DEXTROSE 5 % IV SOLN
500.0000 mg | Freq: Four times a day (QID) | INTRAVENOUS | Status: DC | PRN
  Filled 2016-09-09: qty 5

## 2016-09-09 MED ORDER — SUGAMMADEX SODIUM 200 MG/2ML IV SOLN
INTRAVENOUS | Status: AC
  Filled 2016-09-09: qty 4

## 2016-09-09 MED ORDER — FENTANYL CITRATE (PF) 100 MCG/2ML IJ SOLN
INTRAMUSCULAR | Status: AC
  Administered 2016-09-09: 50 ug via INTRAVENOUS
  Filled 2016-09-09: qty 2

## 2016-09-09 MED ORDER — MENTHOL 3 MG MT LOZG: 1.0000 | LOZENGE | OROMUCOSAL | Status: DC | PRN

## 2016-09-09 MED ORDER — METOCLOPRAMIDE HCL 5 MG/ML IJ SOLN: 5.0000 mg | Freq: Three times a day (TID) | INTRAMUSCULAR | Status: DC | PRN

## 2016-09-09 MED ORDER — PHENOL 1.4 % MT LIQD: 1.0000 | OROMUCOSAL | Status: DC | PRN

## 2016-09-09 MED ORDER — ONDANSETRON HCL 4 MG PO TABS: 4.0000 mg | ORAL_TABLET | Freq: Four times a day (QID) | ORAL | Status: DC | PRN

## 2016-09-09 MED ORDER — RIVAROXABAN 20 MG PO TABS
20.0000 mg | ORAL_TABLET | ORAL | Status: DC
  Filled 2016-09-09: qty 1

## 2016-09-09 MED ORDER — PHENYLEPHRINE 40 MCG/ML (10ML) SYRINGE FOR IV PUSH (FOR BLOOD PRESSURE SUPPORT)
PREFILLED_SYRINGE | INTRAVENOUS | Status: AC
  Filled 2016-09-09: qty 20

## 2016-09-09 MED ORDER — HYDROMORPHONE HCL 1 MG/ML IJ SOLN
0.5000 mg | INTRAMUSCULAR | Status: DC | PRN
  Administered 2016-09-09: 1 mg via INTRAVENOUS
  Filled 2016-09-09: qty 1

## 2016-09-09 MED ORDER — EPINEPHRINE PF 1 MG/ML IJ SOLN
INTRAMUSCULAR | Status: AC
  Filled 2016-09-09: qty 1

## 2016-09-09 MED ORDER — METHOCARBAMOL 500 MG PO TABS
500.0000 mg | ORAL_TABLET | Freq: Four times a day (QID) | ORAL | Status: DC | PRN
  Administered 2016-09-09 – 2016-09-10 (×2): 500 mg via ORAL
  Filled 2016-09-09 (×2): qty 1

## 2016-09-09 MED ORDER — ROCURONIUM BROMIDE 100 MG/10ML IV SOLN
INTRAVENOUS | Status: DC | PRN
  Administered 2016-09-09: 50 mg via INTRAVENOUS

## 2016-09-09 MED ORDER — FUROSEMIDE 20 MG PO TABS: 20.0000 mg | ORAL_TABLET | Freq: Every day | ORAL | Status: DC

## 2016-09-09 MED ORDER — SUGAMMADEX SODIUM 200 MG/2ML IV SOLN
INTRAVENOUS | Status: DC | PRN
  Administered 2016-09-09: 200 mg via INTRAVENOUS

## 2016-09-09 MED ORDER — MIDAZOLAM HCL 2 MG/2ML IJ SOLN
2.0000 mg | Freq: Once | INTRAMUSCULAR | Status: AC
  Administered 2016-09-09: 2 mg via INTRAVENOUS

## 2016-09-09 SURGICAL SUPPLY — 56 items
ANCHOR SUT BIOCOMP CORKSREW (Anchor) ×2 IMPLANT
BENZOIN TINCTURE PRP APPL 2/3 (GAUZE/BANDAGES/DRESSINGS) ×2 IMPLANT
BLADE CUTTER GATOR 3.5 (BLADE) IMPLANT
BLADE GREAT WHITE 4.2 (BLADE) ×2 IMPLANT
BLADE SURG 11 STRL SS (BLADE) ×2 IMPLANT
CANNULA ACUFLEX KIT 5X76 (CANNULA) ×2 IMPLANT
COVER SURGICAL LIGHT HANDLE (MISCELLANEOUS) ×2 IMPLANT
DRAPE STERI 35X30 U-POUCH (DRAPES) ×2 IMPLANT
DRAPE U-SHAPE 47X51 STRL (DRAPES) ×2 IMPLANT
DRSG PAD ABDOMINAL 8X10 ST (GAUZE/BANDAGES/DRESSINGS) ×6 IMPLANT
DURAPREP 26ML APPLICATOR (WOUND CARE) ×2 IMPLANT
ELECT REM PT RETURN 9FT ADLT (ELECTROSURGICAL) ×2
ELECTRODE REM PT RTRN 9FT ADLT (ELECTROSURGICAL) ×1 IMPLANT
GAUZE SPONGE 4X4 12PLY STRL (GAUZE/BANDAGES/DRESSINGS) ×2 IMPLANT
GAUZE SPONGE 4X4 12PLY STRL LF (GAUZE/BANDAGES/DRESSINGS) ×2 IMPLANT
GAUZE XEROFORM 1X8 LF (GAUZE/BANDAGES/DRESSINGS) ×2 IMPLANT
GLOVE BIOGEL PI IND STRL 8 (GLOVE) ×2 IMPLANT
GLOVE BIOGEL PI INDICATOR 8 (GLOVE) ×2
GLOVE ORTHO TXT STRL SZ7.5 (GLOVE) ×4 IMPLANT
GOWN STRL REUS W/ TWL LRG LVL3 (GOWN DISPOSABLE) ×1 IMPLANT
GOWN STRL REUS W/ TWL XL LVL3 (GOWN DISPOSABLE) ×1 IMPLANT
GOWN STRL REUS W/TWL 2XL LVL3 (GOWN DISPOSABLE) ×2 IMPLANT
GOWN STRL REUS W/TWL LRG LVL3 (GOWN DISPOSABLE) ×1
GOWN STRL REUS W/TWL XL LVL3 (GOWN DISPOSABLE) ×1
KIT BASIN OR (CUSTOM PROCEDURE TRAY) ×2 IMPLANT
KIT ROOM TURNOVER OR (KITS) ×2 IMPLANT
MANIFOLD NEPTUNE II (INSTRUMENTS) ×2 IMPLANT
NDL SUT 6 .5 CRC .975X.05 MAYO (NEEDLE) IMPLANT
NEEDLE HYPO 25X1 1.5 SAFETY (NEEDLE) ×2 IMPLANT
NEEDLE MAYO TAPER (NEEDLE)
NEEDLE SPNL 18GX3.5 QUINCKE PK (NEEDLE) ×2 IMPLANT
NS IRRIG 1000ML POUR BTL (IV SOLUTION) ×2 IMPLANT
PACK SHOULDER (CUSTOM PROCEDURE TRAY) ×2 IMPLANT
PAD ABD 8X10 STRL (GAUZE/BANDAGES/DRESSINGS) ×2 IMPLANT
PAD ARMBOARD 7.5X6 YLW CONV (MISCELLANEOUS) ×4 IMPLANT
SET ARTHROSCOPY TUBING (MISCELLANEOUS) ×1
SET ARTHROSCOPY TUBING LN (MISCELLANEOUS) ×1 IMPLANT
SLING ARM FOAM STRAP XLG (SOFTGOODS) ×2 IMPLANT
SLING ARM IMMOBILIZER LRG (SOFTGOODS) IMPLANT
SPONGE LAP 4X18 X RAY DECT (DISPOSABLE) ×4 IMPLANT
STRIP CLOSURE SKIN 1/2X4 (GAUZE/BANDAGES/DRESSINGS) ×2 IMPLANT
SUCTION FRAZIER HANDLE 10FR (MISCELLANEOUS) ×1
SUCTION FRAZIER TIP 8 FR DISP (SUCTIONS) ×1
SUCTION TUBE FRAZIER 10FR DISP (MISCELLANEOUS) ×1 IMPLANT
SUCTION TUBE FRAZIER 8FR DISP (SUCTIONS) ×1 IMPLANT
SUT ANCHOR ULTRAFIX RC (Orthopedic Implant) ×2 IMPLANT
SUT ETHILON 4 0 PS 2 18 (SUTURE) ×2 IMPLANT
SUT VIC AB 0 CT2 27 (SUTURE) IMPLANT
SUT VIC AB 2-0 CT1 27 (SUTURE) ×1
SUT VIC AB 2-0 CT1 TAPERPNT 27 (SUTURE) ×1 IMPLANT
SUT VICRYL 4-0 PS2 18IN ABS (SUTURE) ×2 IMPLANT
TAPE CLOTH SURG 6X10 WHT LF (GAUZE/BANDAGES/DRESSINGS) ×2 IMPLANT
TOWEL OR 17X24 6PK STRL BLUE (TOWEL DISPOSABLE) ×2 IMPLANT
TOWEL OR 17X26 10 PK STRL BLUE (TOWEL DISPOSABLE) ×2 IMPLANT
WAND HAND CNTRL MULTIVAC 90 (MISCELLANEOUS) IMPLANT
WATER STERILE IRR 1000ML POUR (IV SOLUTION) ×2 IMPLANT

## 2016-09-09 NOTE — Anesthesia Preprocedure Evaluation (Signed)
Anesthesia Evaluation  Patient identified by MRN, date of birth, ID band Patient awake    Reviewed: Allergy & Precautions, NPO status , Patient's Chart, lab work & pertinent test results  Airway Mallampati: III  TM Distance: >3 FB Neck ROM: Full    Dental no notable dental hx.    Pulmonary neg pulmonary ROS, Current Smoker,    Pulmonary exam normal breath sounds clear to auscultation       Cardiovascular hypertension, + angina with exertion + CAD, + Past MI, + Peripheral Vascular Disease and +CHF  Normal cardiovascular exam+ Cardiac Defibrillator + Valvular Problems/Murmurs  Rhythm:Regular Rate:Normal  HOCM s/p myomectomy in 2015  AICD MPS The left ventricular ejection fraction is mildly decreased (45-54%). Nuclear stress EF: 49%. Inferior wall hypokinesis. Increased wall thickness noted. There was no ST segment deviation noted during stress. This is a low risk study. No ischemia identified.   Neuro/Psych negative neurological ROS  negative psych ROS   GI/Hepatic negative GI ROS, Neg liver ROS,   Endo/Other  diabetes, Type 2  Renal/GU negative Renal ROS  negative genitourinary   Musculoskeletal negative musculoskeletal ROS (+)   Abdominal (+) + obese,   Peds negative pediatric ROS (+)  Hematology negative hematology ROS (+)   Anesthesia Other Findings   Reproductive/Obstetrics negative OB ROS                             Anesthesia Physical Anesthesia Plan  ASA: IV  Anesthesia Plan: General and Regional   Post-op Pain Management: GA combined w/ Regional for post-op pain   Induction: Intravenous  PONV Risk Score and Plan: 1 and Ondansetron and Midazolam  Airway Management Planned: Oral ETT  Additional Equipment:   Intra-op Plan:   Post-operative Plan: Extubation in OR  Informed Consent: I have reviewed the patients History and Physical, chart, labs and discussed the  procedure including the risks, benefits and alternatives for the proposed anesthesia with the patient or authorized representative who has indicated his/her understanding and acceptance.   Dental advisory given  Plan Discussed with: CRNA  Anesthesia Plan Comments:         Anesthesia Quick Evaluation

## 2016-09-09 NOTE — Progress Notes (Signed)
Orthopedic Tech Progress Note Patient Details:  Kyle Peterson February 08, 1965 [id number] Patient has sling. Patient ID: Kyle Peterson, male   DOB: February 08, 1965, 51 y.o.   MRN: [id number]   Jennye MoccasinHughes, [name] 09/09/2016, 3:06 PM

## 2016-09-09 NOTE — Progress Notes (Signed)
Dr. Ophelia CharterYates doesn't want a PT/INR

## 2016-09-09 NOTE — Anesthesia Procedure Notes (Signed)
Anesthesia Regional Block: Interscalene brachial plexus block   Pre-Anesthetic Checklist: ,, timeout performed, Correct Patient, Correct Site, Correct Laterality, Correct Procedure, Correct Position, site marked, Risks and benefits discussed,  Surgical consent,  Pre-op evaluation,  At surgeon's request and post-op pain management  Laterality: Left  Prep: chloraprep       Needles:  Injection technique: Single-shot  Needle Type: Stimulator Needle - 40     Needle Length: 4cm  Needle Gauge: 22     Additional Needles:   Procedures: ultrasound guided,,,,,,,,  Narrative:  Start time: 09/09/2016 9:57 AM End time: 09/09/2016 10:00 AM Injection made incrementally with aspirations every 5 mL.  Performed by: Personally  Anesthesiologist: Lewie LoronGERMEROTH, Rashea Hoskie  Additional Notes: BP cuff, EKG monitors applied. Sedation begun. Nerve location verified with U/S. Anesthetic injected incrementally, slowly , and after neg aspirations under direct u/s guidance. Good perineural spread. Tolerated well.

## 2016-09-09 NOTE — Anesthesia Procedure Notes (Signed)
Procedure Name: Intubation Date/Time: 09/09/2016 10:25 AM Performed by: Marena ChancyBECKNER, [place] Dresser S Pre-anesthesia Checklist: Patient identified, Emergency Drugs available, Suction available and Patient being monitored Patient Re-evaluated:Patient Re-evaluated prior to induction Oxygen Delivery Method: Circle System Utilized Preoxygenation: Pre-oxygenation with 100% oxygen Induction Type: IV induction Ventilation: Mask ventilation without difficulty Laryngoscope Size: Miller and 2 Grade View: Grade II Tube type: Oral Tube size: 7.5 mm Number of attempts: 1 Airway Equipment and Method: Stylet and Oral airway Placement Confirmation: ETT inserted through vocal cords under direct vision,  positive ETCO2 and breath sounds checked- equal and bilateral Tube secured with: Tape Dental Injury: Teeth and Oropharynx as per pre-operative assessment

## 2016-09-09 NOTE — Discharge Instructions (Addendum)
Surgery for Rotator Cuff Tear, Care After Refer to this sheet in the next few weeks. These instructions provide you with information about caring for yourself after your procedure. Your health care provider may also give you more specific instructions. Your treatment has been planned according to current medical practices, but problems sometimes occur. Call your health care provider if you have any problems or questions after your procedure. What can I expect after the procedure? After the procedure, it is common to have:  Swelling.  Pain.  Stiffness.  Tenderness.  Follow these instructions at home: If you have a sling:  Wear the sling as told by your health care provider. Remove it only as told by your health care provider.  Loosen the sling if your fingers tingle, become numb, or turn cold and blue.  Do not let your sling get wet if it is not waterproof.  Keep the sling clean. Bathing  Do not take baths, swim, or use a hot tub until your health care provider approves. Ask your health care provider if you can take showers. You may only be allowed to take sponge baths for bathing.  Keep your bandage (dressing) dry until your health care provider says it can be removed. Incision care  Follow instructions from your health care provider about how to take care of your incision. Make sure you: ? Wash your hands with soap and water before you change your dressing. If soap and water are not available, use hand sanitizer. ? Change your dressing as told by your health care provider. ? Leave stitches (sutures), skin glue, or adhesive strips in place. These skin closures may need to stay in place for 2 weeks or longer. If adhesive strip edges start to loosen and curl up, you may trim the loose edges. Do not remove adhesive strips completely unless your health care provider tells you to do that.  Check your incision area every day for signs of infection. Check for: ? More redness,  swelling, or pain. ? More fluid or blood. ? Warmth. ? Pus or a bad smell. Managing pain, stiffness, and swelling   If directed, put ice on your shoulder area. ? Put ice in a plastic bag. ? Place a towel between your skin and the bag. ? Leave the ice on for 20 minutes, 2-3 times a day.  Move your fingers often to avoid stiffness and to lessen swelling.  Raise (elevate) your upper body on pillows when you lie down and when you sleep. ? Do not sleep on the front of your body (abdomen). ? Do not sleep on the side that your surgery was performed on. Driving  Do not drive for 24 hours if you received a medicine to help you relax (sedative) during your procedure.  Do not drive or operate heavy machinery while taking prescription pain medicine.  Ask your health care provider when it is safe for you to drive. Activity  Do not use your arm to support your body weight until your health care provider approves.  Do not lift or hold anything with your arm until your health care provider approves.  Return to your normal activities as told by your health care provider. Ask your health care provider what activities are safe for you.  Do exercises as told by your health care provider. General instructions   Do not use any tobacco products, such as cigarettes, chewing tobacco, or e-cigarettes. Tobacco can delay healing. If you need help quitting, ask your health care  provider.  Take over-the-counter and prescription medicines only as told by your health care provider.  If you were prescribed an antibiotic medicine, take it as told by your health care provider. Do not stop taking the antibiotic even if you start to feel better.  Keep all follow-up visits as told by your health care provider. This is important. Contact a health care provider if:  You have a fever.  You have more redness, swelling, or pain around your incision.  You have more fluid or blood coming from your  incision.  Your incision feels warm to the touch.  You have pus or a bad smell coming from your incision.  You have pain that gets worse or does not get better with medicine. Get help right away if:  You have severe pain.  You lose feeling in your arm or hand.  Your hand or fingers turn very pale or blue. This information is not intended to replace advice given to you by your health care provider. Make sure you discuss any questions you have with your health care provider. Document Released: 01/17/2005 Document Revised: 09/23/2015 Document Reviewed: 01/31/2015 Elsevier Interactive Patient Education  2018 [employer] -Can change dressing 48 hours postop and apply 4 x 4 gauze and tape.  -Do not shower. Do not apply any creams or ointments to incision.  -Shoulder immobilizer must be on at all times. Do not move arm. No lifting, pushing, pulling with left upper extremity.  -No driving

## 2016-09-09 NOTE — Progress Notes (Signed)
Local [employer] Rep paged for surgery this morning

## 2016-09-09 NOTE — Op Note (Signed)
Preop diagnosis: Left full-thickness supraspinatus tear. (Rotator cuff tear)  Postop diagnosis: Same  Procedure: Left shoulder arthroscopy debridement undersurface rotator cuff. Opens supraspinatus repair.  Surgeon: Annell GreeningMark Rosalva Neary M.D.  Assistant: Zonia KiefJames Owens PA-C present and assisting through the entire procedure.  Anesthesia: Preoperative block plus general.  Implants: One Arthrex 4 mm anchor, 1 UltraFix anchor  Procedure after induction general anesthesia preoperative block beachchair position preoperative antibiotic prophylaxis timeout procedure arm was prepped with DuraPrep the usual arthroscopic sheets drapes arthroscopic pouch was applied with impervious stockinette and Coban. Inflows placed to the scope 70 cm pressure. Joint was insufflated and carefully inspected. Patient had pacemaker anterior chest wall and care was taken to make sure the anterior portal which was inferior to the biceps tendon was lateral to the pacemaker and pacemaker wires. Blue screwing cannula was placed using the long rod in and out technique. 4.2, shaver was introduced and undersurface rotator cuff debridement was performed. There is some partial detachment of the biceps anchor but most of the biceps anchor was intact. Anterior inferiorly was intact no significant chondromalacia of the glenohumeral joint no instability anterior capsular structures were intact no Hill-Sachs lesion no Bankart lesion.  Biceps was inspected as it exited and was in normal position. At the supraspinatus anterior portion there was full-thickness rotator cuff tear with egressive fluid. Undersurface was trimmed and debrided shoulder suctioned dry and then 4-0 nylon portal closure with simple suture anterior posterior. Skin marker was used an incision was made along the anterior aspect of the acromion. Deltoid was peeled off. Bursectomy was performed. Full-thickness rotator cuff was visualized at the anterior aspect of supraspinatus was cut back a  few millimeters depression surface. Hammer was used followed by placement of the 4 mm screw was countersunk in the bone held securely. Was freshened up and the rotator cuff was advanced sutures are placed in the rotator cuff pulling in securely out to the bone. Anterior to this one single UltraFix anchor was placed and additional sutures were placed pulling the supraspinatus tendon down of the freshened bleeding bone surface. Anterior posterior aspects of rotator cuff was then inspected. Acromioplasty was performed with the three-quarter straight osteotome undersurface was smoothed. Deltoid was repaired back through also made with the UltraFix anchor needle. 1 cm lateral split the deltoid was reapproximated with figure-of-eight sutures. 2-0 Vicryl subtendinous tissue subcuticular closure tincture benzoin Steri-Strips postop dressing and large sling was applied. Patient tolerated the procedure well and was transferred recovery room in stable condition

## 2016-09-09 NOTE — Interval H&P Note (Signed)
History and Physical Interval Note:  09/09/2016 10:13 AM  Kyle Peterson  has presented today for surgery, with the diagnosis of Left Shoulder Rotator Cuff Tear  The various methods of treatment have been discussed with the patient and family. After consideration of risks, benefits and other options for treatment, the patient has consented to  Procedure(s): LEFT SHOULDER ARTHROSCOPY WITH MINI OPEN ROTATOR CUFF REPAIR (Left) as a surgical intervention .  The patient's history has been reviewed, patient examined, no change in status, stable for surgery.  I have reviewed the patient's chart and labs.  Questions were answered to the patient's satisfaction.     Eldred MangesMark C Sriram Febles

## 2016-09-09 NOTE — Progress Notes (Signed)
[employer] rep called back will  Arrive at 0915 unless they need to be called earlier to come in [phone]

## 2016-09-09 NOTE — Transfer of Care (Signed)
Immediate Anesthesia Transfer of Care Note  Patient: Kyle Peterson  Procedure(s) Performed: Procedure(s): LEFT SHOULDER ARTHROSCOPY WITH MINI OPEN ROTATOR CUFF REPAIR (Left)  Patient Location: PACU  Anesthesia Type:GA combined with regional for post-op pain  Level of Consciousness: awake, alert  and drowsy  Airway & Oxygen Therapy: Patient Spontanous Breathing, Patient connected to nasal cannula oxygen and Patient connected to face mask oxygen  Post-op Assessment: Report given to RN and Post -op Vital signs reviewed and stable  Post vital signs: Reviewed and stable  Last Vitals:  Vitals:   09/09/16 1000 09/09/16 1005  BP:    Pulse: 60 (!) 56  Resp: 19 19  Temp:    SpO2: 97% 95%    Last Pain:  Vitals:   09/09/16 0807  TempSrc: Oral      Patients Stated Pain Goal: 3 (09/09/16 0758)  Complications: No apparent anesthesia complications

## 2016-09-10 DIAGNOSIS — Z95810 Presence of automatic (implantable) cardiac defibrillator: Secondary | ICD-10-CM | POA: Diagnosis not present

## 2016-09-10 DIAGNOSIS — F4310 Post-traumatic stress disorder, unspecified: Secondary | ICD-10-CM | POA: Diagnosis not present

## 2016-09-10 DIAGNOSIS — I110 Hypertensive heart disease with heart failure: Secondary | ICD-10-CM | POA: Diagnosis not present

## 2016-09-10 DIAGNOSIS — M75122 Complete rotator cuff tear or rupture of left shoulder, not specified as traumatic: Secondary | ICD-10-CM | POA: Diagnosis not present

## 2016-09-10 DIAGNOSIS — E119 Type 2 diabetes mellitus without complications: Secondary | ICD-10-CM | POA: Diagnosis not present

## 2016-09-10 DIAGNOSIS — I509 Heart failure, unspecified: Secondary | ICD-10-CM | POA: Diagnosis not present

## 2016-09-10 DIAGNOSIS — G4733 Obstructive sleep apnea (adult) (pediatric): Secondary | ICD-10-CM | POA: Diagnosis not present

## 2016-09-10 DIAGNOSIS — I480 Paroxysmal atrial fibrillation: Secondary | ICD-10-CM | POA: Diagnosis not present

## 2016-09-10 DIAGNOSIS — I252 Old myocardial infarction: Secondary | ICD-10-CM | POA: Diagnosis not present

## 2016-09-10 LAB — GLUCOSE, CAPILLARY: Glucose-Capillary: 101 mg/dL — ABNORMAL HIGH (ref 65–99)

## 2016-09-10 NOTE — Evaluation (Signed)
Occupational Therapy Evaluation Patient Details Name: Kyle Peterson MRN: [id number] DOB: 1965-08-08 Today's Date: 09/10/2016    History of Present Illness 51 year old male history of shoulder pain and rotator cuff tear presents to the hospital for surgical intervention. S/p left shoulder arthroscopy with mini open rotator cuff repair.   Clinical Impression   PTA, pt was living alone and was independent with ADLs and IADLs. Pt currently requiring Max A for UB ADLs and sling management. Educated pt on UB ADLs, compensatory techniques, and exercises of hand, wrist, and forearm. Recommend pt dc home once medically stable per physician. Answered all pt questions in preparation for dc today. All acute OT needs met and will sign off.      Follow Up Recommendations  DC plan and follow up therapy as arranged by surgeon;Supervision - Intermittent    Equipment Recommendations  None recommended by OT    Recommendations for Other Services       Precautions / Restrictions Precautions Precautions: Shoulder Type of Shoulder Precautions: No ROM of shoulder Shoulder Interventions: Shoulder sling/immobilizer;At all times Precaution Comments: No ROM of shoulder. Wear sling at all times Required Braces or Orthoses: Sling Restrictions Weight Bearing Restrictions: No      Mobility Bed Mobility               General bed mobility comments: Standing with NT on arrival  Transfers Overall transfer level: Independent Equipment used: None                  Balance Overall balance assessment: No apparent balance deficits (not formally assessed)                                         ADL either performed or assessed with clinical judgement   ADL Overall ADL's : Needs assistance/impaired Eating/Feeding: Set up;Sitting   Grooming: Set up;Standing   Upper Body Bathing: Minimal assistance;Sitting;Cueing for UE precautions Upper Body Bathing Details (indicate cue type and  reason): Educated pt on sponge bathing while maintaining LUE in sling and no ROM of shoulder. Lower Body Bathing: Set up;Sit to/from stand   Upper Body Dressing : Maximal assistance;Cueing for UE precautions;Standing Upper Body Dressing Details (indicate cue type and reason): Max A to don/doff sling. Pt already taken sling off upon OT arrival. Provided education for maintaining sling at all times. Educated pt on donning shirt with sling in place. Min A to adjust shirt once pt has donned it over his RUE and neck.  Lower Body Dressing: Set up;Sit to/from stand   Toilet Transfer: Set up;Supervision/safety (Simulated to recliner)           Functional mobility during ADLs: Supervision/safety General ADL Comments: Pt with decreased functional performance. Provided education on UB ADLs while maintaining sling and shoulder immobilization.      Vision Baseline Vision/History: Wears glasses Patient Visual Report: No change from baseline       Perception     Praxis      Pertinent Vitals/Pain Pain Assessment: Faces Faces Pain Scale: Hurts a little bit Pain Location: L shoulder Pain Descriptors / Indicators: Constant;Discomfort Pain Intervention(s): Monitored during session;Limited activity within patient's tolerance     Hand Dominance Right   Extremity/Trunk Assessment Upper Extremity Assessment Upper Extremity Assessment: LUE deficits/detail LUE Deficits / Details: LEFT SHOULDER ARTHROSCOPY WITH MINI OPEN ROTATOR CUFF REPAIR  LUE: Unable to fully assess due to immobilization LUE  Coordination: decreased gross motor   Lower Extremity Assessment Lower Extremity Assessment: Overall WFL for tasks assessed   Cervical / Trunk Assessment Cervical / Trunk Assessment: Normal   Communication Communication Communication: No difficulties   Cognition Arousal/Alertness: Awake/alert Behavior During Therapy: Restless Overall Cognitive Status: Within Functional Limits for tasks assessed                                      General Comments  Pt repeating that he is ready to leave.     Exercises Exercises: Hand exercises Hand Exercises Forearm Supination: AROM;Left;5 reps;Standing (While in sling) Forearm Pronation: AROM;Left;5 reps;Standing (While in sling) Wrist Flexion: AROM;Left;5 reps;Standing (While in sling) Wrist Extension: AROM;Left;5 reps;Standing (While in sling) Digit Composite Flexion: AROM;Left;5 reps;Standing (While in sling) Composite Extension: AROM;Left;5 reps;Standing (While in sling)   Shoulder Instructions      Home Living Family/patient expects to be discharged to:: Private residence Living Arrangements: Alone Available Help at Discharge: Family;Available PRN/intermittently (Father may be able to come in and help occationally) Type of Home: House Home Access: Level entry     Home Layout: One level     Bathroom Shower/Tub: Teacher, early years/pre: Standard     Home Equipment: None          Prior Functioning/Environment Level of Independence: Independent        Comments: ADLs, IADLs, driving, owns a cat        OT Problem List: Decreased range of motion;Decreased activity tolerance;Decreased safety awareness;Decreased knowledge of use of DME or AE;Decreased knowledge of precautions;Pain;Impaired UE functional use      OT Treatment/Interventions:      OT Goals(Current goals can be found in the care plan section) Acute Rehab OT Goals Patient Stated Goal: Go home soon OT Goal Formulation: With patient Time For Goal Achievement: 09/24/16 Potential to Achieve Goals: Good  OT Frequency:     Barriers to D/C:            Co-evaluation              AM-PAC PT "6 Clicks" Daily Activity     Outcome Measure Help from another person eating meals?: None Help from another person taking care of personal grooming?: None Help from another person toileting, which includes using toliet, bedpan, or urinal?: A  Little Help from another person bathing (including washing, rinsing, drying)?: A Little Help from another person to put on and taking off regular upper body clothing?: A Lot Help from another person to put on and taking off regular lower body clothing?: A Little 6 Click Score: 19   End of Session Equipment Utilized During Treatment: Other (comment) (Sling) Nurse Communication: Mobility status  Activity Tolerance: Patient tolerated treatment well Patient left: in chair;with call bell/phone within reach  OT Visit Diagnosis: Pain Pain - Right/Left: Left Pain - part of body: Shoulder                Time: 4401-0272 OT Time Calculation (min): 16 min Charges:  OT General Charges $OT Visit: 1 Procedure OT Evaluation $OT Eval Low Complexity: 1 Procedure G-Codes: OT G-codes **NOT FOR INPATIENT CLASS** Functional Assessment Tool Used: Clinical judgement Functional Limitation: Self care Self Care Current Status (Z3664): At least 40 percent but less than 60 percent impaired, limited or restricted Self Care Goal Status (Q0347): At least 20 percent but less than 40 percent impaired, limited or  restricted Self Care Discharge Status [phone]): At least 40 percent but less than 60 percent impaired, limited or restricted   [place], OTR/L Acute Rehab Pager: [phone] Office: [place] 09/10/2016, 9:15 AM

## 2016-09-10 NOTE — Progress Notes (Signed)
Patient alert and oriented, mae's well, voiding adequate amount of urine, swallowing without difficulty, no c/o pain at time of discharge. Patient discharged home with family. Script and discharged instructions given to patient. Patient and family stated understanding of instructions given. Patient has an appointment with Dr. yates  

## 2016-09-12 ENCOUNTER — Encounter (HOSPITAL_COMMUNITY): Payer: Self-pay | Admitting: Orthopaedic Surgery

## 2016-09-12 ENCOUNTER — Other Ambulatory Visit: Payer: Self-pay | Admitting: Family Medicine

## 2016-09-12 NOTE — Anesthesia Postprocedure Evaluation (Signed)
Anesthesia Post Note  Patient: Kyle Peterson  Procedure(s) Performed: Procedure(s) (LRB): LEFT SHOULDER ARTHROSCOPY WITH MINI OPEN ROTATOR CUFF REPAIR (Left)     Patient location during evaluation: PACU Anesthesia Type: Regional and General Level of consciousness: sedated and patient cooperative Pain management: pain level controlled Vital Signs Assessment: post-procedure vital signs reviewed and stable Respiratory status: spontaneous breathing Cardiovascular status: stable Anesthetic complications: no    Last Vitals:  Vitals:   09/10/16 0400 09/10/16 0818  BP: (!) 150/96 (!) 148/78  Pulse: (!) 57 71  Resp: 20 18  Temp: (!) 36.4 C 36.5 C  SpO2: 97% 94%    Last Pain:  Vitals:   09/10/16 0605  TempSrc:   PainSc: 5                  Lewie LoronJohn Clinten Howk

## 2016-09-16 ENCOUNTER — Other Ambulatory Visit: Payer: Self-pay | Admitting: *Deleted

## 2016-09-16 MED ORDER — FUROSEMIDE 20 MG PO TABS: ORAL_TABLET | ORAL | 0 refills | Status: DC

## 2016-09-16 NOTE — Telephone Encounter (Signed)
Patient called requesting a short supply of Lasix be sent to [facility].  [facility] received the refill however, they will not send out for another 7-10 days. Please advise.  Clovis Pu, RN

## 2016-09-19 ENCOUNTER — Telehealth: Payer: Self-pay | Admitting: Cardiology

## 2016-09-19 ENCOUNTER — Ambulatory Visit (INDEPENDENT_AMBULATORY_CARE_PROVIDER_SITE_OTHER): Payer: Medicare HMO | Admitting: *Deleted

## 2016-09-19 DIAGNOSIS — I422 Other hypertrophic cardiomyopathy: Secondary | ICD-10-CM | POA: Diagnosis not present

## 2016-09-19 DIAGNOSIS — G4733 Obstructive sleep apnea (adult) (pediatric): Secondary | ICD-10-CM | POA: Diagnosis not present

## 2016-09-19 NOTE — Telephone Encounter (Signed)
New Message      *STAT* If patient is at the pharmacy, call can be transferred to refill team.   1. Which medications need to be refilled? (please list name of each medication and dose if known) furosemide (LASIX) 20 MG tablet TAKE 1 TABLET EVERY DAY (NEED OFFICE VISIT BEFORE MORE REFILLS CAN BE APPROVED)      metoprolol succinate (TOPROL-XL) 50 MG 24 hr tablet TAKE 1 TABLET DAILY. TAKE WITH OR IMMEDIATELY FOLLOWING A MEAL.     2. Which pharmacy/location (including street and city if local pharmacy) is medication to be sent to?  [facility]   3. Do they need a 30 day or 90 day supply?  7 days - mail order was not sent out on time

## 2016-09-19 NOTE — Telephone Encounter (Signed)
Spoke with patient to make him aware that we have never filled these for him and he stated that he just received his shipment in the mail.

## 2016-09-20 ENCOUNTER — Encounter (INDEPENDENT_AMBULATORY_CARE_PROVIDER_SITE_OTHER): Payer: Self-pay | Admitting: Orthopaedic Surgery

## 2016-09-20 ENCOUNTER — Ambulatory Visit (INDEPENDENT_AMBULATORY_CARE_PROVIDER_SITE_OTHER): Payer: Medicare HMO | Admitting: Orthopaedic Surgery

## 2016-09-20 VITALS — BP 151/85 | HR 64

## 2016-09-20 DIAGNOSIS — Z9889 Other specified postprocedural states: Secondary | ICD-10-CM

## 2016-09-20 NOTE — Progress Notes (Signed)
Remote ICD transmission.   

## 2016-09-20 NOTE — Progress Notes (Signed)
Post-Op Visit Note   Patient: Kyle Peterson           Date of Birth: [birth year]           MRN: [id number] Visit Date: 09/20/2016 PCP: Carney Living, MD   Assessment & Plan: Status post left rotator cuff repair.  Chief Complaint: Patient is comfortable in a sling. Visit Diagnoses: Status post full-thickness supraspinatus repair.  Plan: Sutures removed Steri-Strips changed. We'll set him up for outpatient therapy may 1 time a week until he 6 weeks postop and then they can begin active the supraspinatus exercises and then gradually progressive resistive exercises. Recheck 4 weeks. He felt his pain medication.  Follow-Up Instructions: No Follow-up on file.   Orders:  No orders of the defined types were placed in this encounter.  No orders of the defined types were placed in this encounter.   Imaging: No results found.  PMFS History: Patient Active Problem List   Diagnosis Date Noted  . Rotator cuff tear 09/09/2016  . Dilated aortic root (HCC) 07/20/2016  . Claudication (HCC) 07/20/2016  . Heart murmur 07/20/2016  . Trigger finger, left middle finger 06/21/2016  . Diabetes (HCC) 05/24/2016  . Skin induration 02/19/2016  . Depression, major, recurrent, moderate (HCC) 11/05/2015  . Chronic post-traumatic stress disorder (PTSD) 11/05/2015    Class: Chronic  . OSA (obstructive sleep apnea)   . Persistent atrial fibrillation (HCC) 08/19/2015  . CAD (coronary artery disease), native coronary artery 08/19/2015  . Shoulder pain 06/11/2015  . Hyperlipidemia 06/10/2015  . Tobacco use disorder 05/28/2015  . Essential hypertension 05/27/2015  . Bipolar disorder (HCC) 05/27/2015  . 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 Obstructive sleep apnea 05/27/2015   Past Medical History:  Diagnosis Date  . AICD (automatic cardioverter/defibrillator) present    bostan scien  . Anginal pain (HCC)    ?heart pain   per dr Estill Dooms  . Anxiety   . Arthritis    ddd  . CAD  (coronary artery disease), native coronary artery 08/19/2015  . CHF (congestive heart failure) (HCC)   . Depression   . Diabetes mellitus without complication (HCC)    no meds  new dx  . Heart murmur   . High cholesterol   . Hypertension   . Myocardial infarction (HCC)   . OSA (obstructive sleep apnea)    Severe with AHI 80/hr- new bipap  . PAF (paroxysmal atrial fibrillation) (HCC) 08/19/2015  . PTSD (post-traumatic stress disorder)   . Stroke (HCC) 2016  . Substance abuse     Family History  Problem Relation Age of Onset  . Diabetes Mother   . Hypertension Mother   . Diabetes Father   . Hypertension Father   . Heart disease Father   . Alcohol abuse Father   . Alcohol abuse Brother   . Drug abuse Brother     Past Surgical History:  Procedure Laterality Date  . CARDIAC CATHETERIZATION     01/13/14 [facility]: LM NL, mild narrowing of proximal/mid LAD, mid LCX, proximal RCA. 30% ostial OM1. 40% distal RCA. Medical tx.   . CARPAL TUNNEL RELEASE Left 11/02/2015  . CERVICAL DISCECTOMY    . MYOMECTOMY    . SHOULDER ARTHROSCOPY WITH ROTATOR CUFF REPAIR Left 09/09/2016   Procedure: LEFT SHOULDER ARTHROSCOPY WITH MINI OPEN ROTATOR CUFF REPAIR;  Surgeon: Eldred Manges, MD;  Location: [facility] OR;  Service: Orthopedics;  Laterality: Left;  . TONSILLECTOMY     Social History   Occupational History  .  Not on file.   Social History Main Topics  . Smoking status: Current Every Day Smoker    Packs/day: 0.50    Years: 40.00    Types: Cigarettes  . Smokeless tobacco: Never Used     Comment: Trying to cut back   . Alcohol use No  . Drug use: Yes    Frequency: 3.0 times per week    Types: Marijuana     Comment: last 07/26/16  . Sexual activity: Not Currently

## 2016-09-21 ENCOUNTER — Ambulatory Visit (INDEPENDENT_AMBULATORY_CARE_PROVIDER_SITE_OTHER): Payer: Medicare HMO | Admitting: Family Medicine

## 2016-09-21 ENCOUNTER — Encounter: Payer: Self-pay | Admitting: Family Medicine

## 2016-09-21 VITALS — BP 144/84 | HR 66 | Temp 98.1°F | Ht 68.0 in | Wt 297.8 lb

## 2016-09-21 DIAGNOSIS — E119 Type 2 diabetes mellitus without complications: Secondary | ICD-10-CM | POA: Diagnosis not present

## 2016-09-21 DIAGNOSIS — I10 Essential (primary) hypertension: Secondary | ICD-10-CM | POA: Diagnosis not present

## 2016-09-21 DIAGNOSIS — Z1211 Encounter for screening for malignant neoplasm of colon: Secondary | ICD-10-CM

## 2016-09-21 NOTE — Assessment & Plan Note (Signed)
BP Readings from Last 3 Encounters:  09/21/16 (!) 144/84  09/20/16 (!) 151/85  09/10/16 (!) 148/78   Elevated but just had surgery and is working on diet.  Will follow

## 2016-09-21 NOTE — Patient Instructions (Addendum)
Good to see you today!  Thanks for coming in.  Make an appointment to see our nutritionist - Dr Gerilyn Pilgrim  Before visit keep a food diary - write down everything you eat or drink for 2-3 days and bring in to her  Read the food labels look for high calorie foods  See an eye doctor  To check for diabetes and send [place] a report  Come back in 6 weeks   Send in the stool cards

## 2016-09-21 NOTE — Assessment & Plan Note (Signed)
Newly diagnosed.   He would like to work with his diet.  Limited exercise ability currently with recent shoulder surgery and chronic back pain.  Will refer to nutrition (see after visit summary).  Will check back for A1c and to explore his back pain more.  May need to start metformin (hpefully temporarily)  if not losing weight and improving A1c

## 2016-09-21 NOTE — Progress Notes (Signed)
Subjective  Patient is presenting with the following illnesses  DIABETES Disease Monitoring: Blood Sugar ranges(Severity) -not checking   Associated Symptoms- Polyuria/phagia/dipsia- no      Visual problems- no Medications: Compliance(Modifying factor) - just had shoulder surgery also has chronic back pain that limits ability to walk.  Does not drink sweet drinks but does like pasta.  Feels his food intake is not as much as it used to be.  Wants to try to avoid medications  Hypoglycemic symptoms- no Timing - continuous  HYPERTENSION Disease Monitoring  Home BP Monitoring (Severity) not checking but thinks was controlled when had surgery Symptoms - Chest pain- no    Dyspnea- no Medications (Modifying factors) Compliance-  daily. Lightheadedness-  no  Edema- mild at end of day  Timing - continuous  Duration - years ROS - See HPI  PMH Lab Review   Potassium  Date Value Ref Range Status  09/05/2016 4.0 3.5 - 5.1 mmol/L Final   Sodium  Date Value Ref Range Status  09/05/2016 141 135 - 145 mmol/L Final  08/08/2016 142 134 - 144 mmol/L Final   Creat  Date Value Ref Range Status  06/10/2015 1.20 0.60 - 1.35 mg/dL Final   Creatinine, Ser  Date Value Ref Range Status  09/05/2016 1.26 (H) 0.61 - 1.24 mg/dL Final          Monitoring Labs and Parameters Last A1C:  Lab Results  Component Value Date   HGBA1C 7.0 (H) 07/26/2016   Last Lipid:     Component Value Date/Time   CHOL 167 08/08/2016 0910   HDL 36 (L) 08/08/2016 0910   Last Bmet  Potassium  Date Value Ref Range Status  09/05/2016 4.0 3.5 - 5.1 mmol/L Final   Sodium  Date Value Ref Range Status  09/05/2016 141 135 - 145 mmol/L Final  08/08/2016 142 134 - 144 mmol/L Final   Creat  Date Value Ref Range Status  06/10/2015 1.20 0.60 - 1.35 mg/dL Final   Creatinine, Ser  Date Value Ref Range Status  09/05/2016 1.26 (H) 0.61 - 1.24 mg/dL Final      Chief Complaint noted Review of Symptoms - see HPI PMH -  Smoking status noted.     Objective Vital Signs reviewed Diabetic Foot Check -  Appearance - no lesions, ulcers or calluses Skin - no unusual pallor or redness Monofilament testing -  Right - Great toe, medial, central, lateral ball and posterior foot intact Left - Great toe, medial, central, lateral ball and posterior foot intact     Assessments/Plans  No problem-specific Assessment & Plan notes found for this encounter.   See Encounter view if individual problem A/Ps not visible See after visit summary for details of patient instuctions

## 2016-09-22 DIAGNOSIS — H5213 Myopia, bilateral: Secondary | ICD-10-CM | POA: Diagnosis not present

## 2016-09-22 DIAGNOSIS — H52209 Unspecified astigmatism, unspecified eye: Secondary | ICD-10-CM | POA: Diagnosis not present

## 2016-09-22 DIAGNOSIS — Z1211 Encounter for screening for malignant neoplasm of colon: Secondary | ICD-10-CM | POA: Diagnosis not present

## 2016-09-22 LAB — HM DIABETES EYE EXAM

## 2016-09-22 NOTE — Addendum Note (Signed)
Addended by: Jennette Bill on: 09/22/2016 01:59 PM   Modules accepted: Orders

## 2016-09-23 ENCOUNTER — Encounter: Payer: Self-pay | Admitting: Physical Therapy

## 2016-09-23 ENCOUNTER — Ambulatory Visit: Payer: Medicare HMO | Attending: Orthopaedic Surgery | Admitting: Physical Therapy

## 2016-09-23 DIAGNOSIS — M25612 Stiffness of left shoulder, not elsewhere classified: Secondary | ICD-10-CM

## 2016-09-23 DIAGNOSIS — M25512 Pain in left shoulder: Secondary | ICD-10-CM | POA: Insufficient documentation

## 2016-09-23 DIAGNOSIS — M6281 Muscle weakness (generalized): Secondary | ICD-10-CM | POA: Diagnosis not present

## 2016-09-23 NOTE — Therapy (Signed)
[facility] [address] [place], [place], [address] Phone: [phone]   Fax:  [phone]  Physical Therapy Evaluation  Patient Details  Name: Kyle Peterson MRN: [id number] Date of Birth: 05/02/1965 Referring Provider: Annell Greening MD  Encounter Date: 09/23/2016      PT End of Session - 09/23/16 1321    Visit Number 1   Number of Visits 17   Date for PT Re-Evaluation 11/18/16   Authorization Type MCR: Kx mod by 15th visit, progress note by 10th or 9/24   PT Start Time 1101   PT Stop Time 1146   PT Time Calculation (min) 45 min   Activity Tolerance Patient tolerated treatment well   Behavior During Therapy [facility] for tasks assessed/performed      Past Medical History:  Diagnosis Date  . AICD (automatic cardioverter/defibrillator) present    bostan scien  . Anginal pain (HCC)    ?heart pain   per dr Estill Dooms  . Anxiety   . Arthritis    ddd  . CAD (coronary artery disease), native coronary artery 08/19/2015  . CHF (congestive heart failure) (HCC)   . Depression   . Diabetes mellitus without complication (HCC)    no meds  new dx  . Heart murmur   . High cholesterol   . Hypertension   . Myocardial infarction (HCC)   . OSA (obstructive sleep apnea)    Severe with AHI 80/hr- new bipap  . PAF (paroxysmal atrial fibrillation) (HCC) 08/19/2015  . PTSD (post-traumatic stress disorder)   . Stroke (HCC) 2016  . Substance abuse     Past Surgical History:  Procedure Laterality Date  . CARDIAC CATHETERIZATION     01/13/14 [facility]: LM NL, mild narrowing of proximal/mid LAD, mid LCX, proximal RCA. 30% ostial OM1. 40% distal RCA. Medical tx.   . CARPAL TUNNEL RELEASE Left 11/02/2015  . CERVICAL DISCECTOMY    . MYOMECTOMY    . SHOULDER ARTHROSCOPY WITH ROTATOR CUFF REPAIR Left 09/09/2016   Procedure: LEFT SHOULDER ARTHROSCOPY WITH MINI OPEN ROTATOR CUFF REPAIR;  Surgeon: Eldred Manges, MD;  Location: [facility] OR;  Service:  Orthopedics;  Laterality: Left;  . TONSILLECTOMY      There were no vitals filed for this visit.       Subjective Assessment - 09/23/16 1108    Subjective pt is a 51 y.o s/p L shoulder RCR on 09/09/2016. pt reports pain has been going, some tenderness but overall every has been doing pretty good. Denies N/t in the arm. pt reports not using the sling due to irritation of the skin in the elbow.    Pertinent History hx of MI and DM   Limitations Lifting;House hold activities   How long can you sit comfortably? from back pain 45 - 60 min   How long can you stand comfortably? 15 win   How long can you walk comfortably? 30 min   Diagnostic tests X-ray   Patient Stated Goals get motion back, get strength back, be able to lift weights and return to the gym.    Currently in Pain? Yes   Pain Score 0-No pain  at worst 5/10   Pain Location Shoulder   Pain Orientation Left   Pain Descriptors / Indicators Aching;Sore   Pain Type Surgical pain   Pain Frequency Occasional   Aggravating Factors  shoulder movement and quick motions   Pain Relieving Factors stopping activity  [facility] PT Assessment - 09/23/16 1115      Assessment   Medical Diagnosis S/P L rotator cuff repair   Referring Provider Annell Greening MD   Onset Date/Surgical Date 09/09/16   Hand Dominance Right   Next MD Visit --  10/21/2016   Prior Therapy yes  cardiac PT     Precautions   Precautions Shoulder   Precaution Comments no lifting/ carrying, pushing, pulling, any movement     Restrictions   Weight Bearing Restrictions Yes   LUE Weight Bearing Non weight bearing     Balance Screen   Has the patient fallen in the past 6 months No   Has the patient had a decrease in activity level because of a fear of falling?  No   Is the patient reluctant to leave their home because of a fear of falling?  No     Home Environment   Living Environment Private residence   Living Arrangements Alone   Type of Home  Apartment   Home Access Level entry   Home Layout One level   Home Equipment Other (comment);Walker - 2 wheels;Cane - single point;Wheelchair - manual  sling     Prior Function   Level of Independence Independent with basic ADLs   Vocation On disability   Leisure playing guitar      Cognition   Overall Cognitive Status Within Functional Limits for tasks assessed     Observation/Other Assessments   Focus on Therapeutic Outcomes (FOTO)  54% limited  predicted 30% lmited     ROM / Strength   AROM / PROM / Strength AROM;PROM;Strength     AROM   Overall AROM Comments LUE not assessed due to precautions   AROM Assessment Site Shoulder   Right/Left Shoulder Right;Left   Right Shoulder Extension 60 Degrees   Right Shoulder Flexion 127 Degrees   Right Shoulder ABduction 84 Degrees   Right Shoulder Internal Rotation 50 Degrees  to stomach   Right Shoulder External Rotation 65 Degrees  with elbow in neutral     PROM   Overall PROM Comments ERP in all planes   PROM Assessment Site Shoulder   Right/Left Shoulder Left   Left Shoulder Extension 40 Degrees   Left Shoulder Flexion 56 Degrees   Left Shoulder ABduction 65 Degrees   Left Shoulder Internal Rotation 40 Degrees   Left Shoulder External Rotation 15 Degrees     Strength   Overall Strength Comments L shoulder not assessment due to precatuion   Strength Assessment Site Shoulder   Right/Left Shoulder Right;Left   Right Shoulder Flexion 4/5  within availble ROM   Right Shoulder Extension 4/5  within availble ROM   Right Shoulder ABduction 4/5  within availble ROM   Right Shoulder Internal Rotation 4/5  within availble ROM   Right Shoulder External Rotation 4/5  within availble ROM   Right Hand Grip (lbs) 62  68,60,58   Left Hand Grip (lbs) 63.6  64,64,63     Palpation   Palpation comment around the incision site and in the deltoid, and at the greater tubercle, upper trap tightness            Objective  measurements completed on examination: See above findings.          [facility] Adult PT Treatment/Exercise - 09/23/16 0001      Shoulder Exercises: Seated   Retraction AROM;Strengthening;Both;10 reps     Shoulder Exercises: ROM/Strengthening   Other ROM/Strengthening Exercises table slides flexion/ abduction  1 x 10 each  cues to avoid going past 90 degrees                PT Education - 09/23/16 1320    Education provided Yes   Education Details evaluation findings, POC, goals, HEP with form/ rationale, precautions,    Person(s) Educated Patient   Methods Explanation;Verbal cues;Handout;Demonstration   Comprehension Verbalized understanding;Verbal cues required;Returned demonstration          PT Short Term Goals - 09/23/16 1329      PT SHORT TERM GOAL #1   Title pt to be I with initial HEP   Time 4   Period Weeks   Target Date 10/21/16     PT SHORT TERM GOAL #2   Title pt to increase L shoulder PROM/AAROM to >/= 90 degrees flexion/ abduction and 45 degrees of ER/ IR to promote shoulder mobility with </= 5/10 pain   Time 4   Period Weeks   Status New   Target Date 10/21/16     PT SHORT TERM GOAL #3   Title pt will increase L grip strength by >/= 5# to demo functional improvement in shoulder   Time 4   Period Weeks   Status New   Target Date 10/21/16           PT Long Term Goals - 09/23/16 1332      PT LONG TERM GOAL #1   Title pt to increase L shoulder flexion/ abduction to >/= 120 degrees and IR/ER to >/=60 degrees with </= 1/10 pain for functional mobility required for ADLS   Time 8   Period Weeks   Status New   Target Date 11/18/16     PT LONG TERM GOAL #2   Title pt to increase L shoulder to >/= 4/5 in all planes to provide stability and assist with lifting and carrying activities    Time 8   Period Weeks   Status New   Target Date 11/18/16     PT LONG TERM GOAL #3   Title pt to be able to lift and lower >/= 8# to and from and over head  shelf and push/pull >/= 15# with </= 1/10 pain for functional strength required for ADLs    Time 8   Period Weeks   Status New   Target Date 11/18/16     PT LONG TERM GOAL #4   Title increase FOTO score to </=30% to demo improvement in function    Time 8   Period Weeks   Status New   Target Date 11/18/16     PT LONG TERM GOAL #5   Title pt to be I with all HEp given as of last visit    Time 8   Period Weeks   Status New   Target Date 11/18/16                Plan - 09/23/16 1321    Clinical Impression Statement pt presents to OPPT s/p L shoulder RCR on 09/09/2016. He currently reports no pain except for movement, and is non-compliant with using a sling. AROM and MMT was not assessed due to precautions, PROM is limited in all planes with end range soreness. TTP along the incision site and at the greater tubercle as well as tightness noted in the L upper trap. He would benefit from physical therapy to improve shoulder mobility, increase strength, and maximze his function by addressing the deficits listed.   Clinical Presentation  Stable   Clinical Decision Making Low   Rehab Potential Good   PT Frequency 2x / week   PT Duration 8 weeks   PT Treatment/Interventions ADLs/Self Care Home Management;Cryotherapy;Electrical Stimulation;Iontophoresis 4mg /ml Dexamethasone;Moist Heat;Ultrasound;Therapeutic activities;Therapeutic exercise;Taping;Manual techniques;Passive range of motion;Patient/family education;Neuromuscular re-education;Vasopneumatic Device   PT Next Visit Plan review and update HEP PRN, protocol for RCR, PROM, grip strength, scapular stability, bicep activation, GHJ/ scapular mobs, modalities PRN   PT Home Exercise Plan scapular retraction, table slides flexion/ abduction   Consulted and Agree with Plan of Care Patient      Patient will benefit from skilled therapeutic intervention in order to improve the following deficits and impairments:  Pain, Improper body  mechanics, Postural dysfunction, Decreased strength, Decreased mobility, Decreased range of motion, Decreased activity tolerance, Decreased endurance, Increased fascial restricitons, Increased muscle spasms, Obesity, Decreased safety awareness  Visit Diagnosis: Acute pain of left shoulder - Plan: PT plan of care cert/re-cert  Stiffness of left shoulder, not elsewhere classified - Plan: PT plan of care cert/re-cert  Muscle weakness (generalized) - Plan: PT plan of care cert/re-cert      G-Codes - 10/21/2016 1336    Functional Assessment Tool Used (Outpatient Only) ROM, MMT, FOTO   Functional Limitation Carrying, moving and handling objects   Carrying, Moving and Handling Objects Current Status (T6256) At least 60 percent but less than 80 percent impaired, limited or restricted   Carrying, Moving and Handling Objects Goal Status (L8937) At least 20 percent but less than 40 percent impaired, limited or restricted       Problem List Patient Active Problem List   Diagnosis Date Noted  . Dilated aortic root (HCC) 07/20/2016  . Claudication (HCC) 07/20/2016  . Heart murmur 07/20/2016  . Trigger finger, left middle finger 06/21/2016  . Diabetes (HCC) 05/24/2016  . Skin induration 02/19/2016  . Depression, major, recurrent, moderate (HCC) 11/05/2015  . Chronic post-traumatic stress disorder (PTSD) 11/05/2015    Class: Chronic  . OSA (obstructive sleep apnea)   . Persistent atrial fibrillation (HCC) 08/19/2015  . CAD (coronary artery disease), native coronary artery 08/19/2015  . Shoulder pain 06/11/2015  . Hyperlipidemia 06/10/2015  . Tobacco use disorder 05/28/2015  . Essential hypertension 05/27/2015  . Bipolar disorder (HCC) 05/27/2015  . 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 Obstructive sleep apnea 05/27/2015   Lulu Riding PT, DPT, LAT, ATC  Oct 21, 2016  1:39 PM      [facility] [address] [place],  [place], [address] Phone: [phone]   Fax:  [phone]  Name: Kyle Peterson MRN: [id number] Date of Birth: 1965/03/17

## 2016-09-27 ENCOUNTER — Ambulatory Visit: Payer: Medicare HMO | Admitting: Physical Therapy

## 2016-09-27 DIAGNOSIS — M6281 Muscle weakness (generalized): Secondary | ICD-10-CM

## 2016-09-27 DIAGNOSIS — M25512 Pain in left shoulder: Secondary | ICD-10-CM | POA: Diagnosis not present

## 2016-09-27 DIAGNOSIS — M25612 Stiffness of left shoulder, not elsewhere classified: Secondary | ICD-10-CM | POA: Diagnosis not present

## 2016-09-27 LAB — FECAL OCCULT BLOOD, IMMUNOCHEMICAL: FECAL OCCULT BLD: NEGATIVE

## 2016-09-27 NOTE — Addendum Note (Signed)
Addended by: Pearlean Brownie L on: 09/27/2016 11:35 AM   Modules accepted: Orders

## 2016-09-28 ENCOUNTER — Encounter: Payer: Self-pay | Admitting: Physical Therapy

## 2016-09-28 NOTE — Therapy (Signed)
[facility] [address] [place], [place], [address] Phone: [phone]   Fax:  [phone]  Physical Therapy Treatment  Patient Details  Name: Kyle Peterson MRN: [id number] Date of Birth: 1965/07/23 Referring Provider: Annell Greening MD  Encounter Date: 09/27/2016      PT End of Session - 09/28/16 1420    Visit Number 2   Number of Visits 17   Date for PT Re-Evaluation 11/18/16   Authorization Type MCR: Kx mod by 15th visit, progress note by 10th or 9/24   PT Start Time 1634   PT Stop Time 1714   PT Time Calculation (min) 40 min   Equipment Utilized During Treatment Gait belt   Activity Tolerance Patient tolerated treatment well   Behavior During Therapy [facility] for tasks assessed/performed      Past Medical History:  Diagnosis Date  . AICD (automatic cardioverter/defibrillator) present    bostan scien  . Anginal pain (HCC)    ?heart pain   per dr Estill Dooms  . Anxiety   . Arthritis    ddd  . CAD (coronary artery disease), native coronary artery 08/19/2015  . CHF (congestive heart failure) (HCC)   . Depression   . Diabetes mellitus without complication (HCC)    no meds  new dx  . Heart murmur   . High cholesterol   . Hypertension   . Myocardial infarction (HCC)   . OSA (obstructive sleep apnea)    Severe with AHI 80/hr- new bipap  . PAF (paroxysmal atrial fibrillation) (HCC) 08/19/2015  . PTSD (post-traumatic stress disorder)   . Stroke (HCC) 2016  . Substance abuse     Past Surgical History:  Procedure Laterality Date  . CARDIAC CATHETERIZATION     01/13/14 [facility]: LM NL, mild narrowing of proximal/mid LAD, mid LCX, proximal RCA. 30% ostial OM1. 40% distal RCA. Medical tx.   . CARPAL TUNNEL RELEASE Left 11/02/2015  . CERVICAL DISCECTOMY    . MYOMECTOMY    . SHOULDER ARTHROSCOPY WITH ROTATOR CUFF REPAIR Left 09/09/2016   Procedure: LEFT SHOULDER ARTHROSCOPY WITH MINI OPEN ROTATOR CUFF REPAIR;  Surgeon: Eldred Manges, MD;  Location: [facility] OR;  Service: Orthopedics;  Laterality: Left;  . TONSILLECTOMY      There were no vitals filed for this visit.      Subjective Assessment - 09/28/16 1416    Subjective Psatient continues to report no pain. He feels like it is tight. He is doing his exercises without pain    Pertinent History hx of MI and DM   Limitations Lifting;House hold activities   How long can you sit comfortably? from back pain 45 - 60 min   How long can you stand comfortably? 15 win   How long can you walk comfortably? 30 min   Diagnostic tests X-ray   Patient Stated Goals get motion back, get strength back, be able to lift weights and return to the gym.    Currently in Pain? No/denies                         [facility] Adult PT Treatment/Exercise - 09/28/16 0001      Shoulder Exercises: Seated   Retraction 20 reps   Other Seated Exercises elbow flexion x20; towel squeeze 2x10      Manual Therapy   Manual Therapy Passive ROM;Joint mobilization   Joint Mobilization Grade II and III PA and AP glides to improve ER IR  Passive ROM gentl ePROM into all movements per protocol                 PT Education - 09/28/16 1416    Education provided Yes   Education Details use the sling; reviewed the improtance of letting the tendon heal    Person(s) Educated Patient   Methods Explanation;Demonstration;Tactile cues;Verbal cues   Comprehension Verbalized understanding;Returned demonstration;Verbal cues required;Tactile cues required          PT Short Term Goals - 09/23/16 1329      PT SHORT TERM GOAL #1   Title pt to be I with initial HEP   Time 4   Period Weeks   Target Date 10/21/16     PT SHORT TERM GOAL #2   Title pt to increase L shoulder PROM/AAROM to >/= 90 degrees flexion/ abduction and 45 degrees of ER/ IR to promote shoulder mobility with </= 5/10 pain   Time 4   Period Weeks   Status New   Target Date 10/21/16     PT SHORT TERM GOAL #3    Title pt will increase L grip strength by >/= 5# to demo functional improvement in shoulder   Time 4   Period Weeks   Status New   Target Date 10/21/16           PT Long Term Goals - 09/23/16 1332      PT LONG TERM GOAL #1   Title pt to increase L shoulder flexion/ abduction to >/= 120 degrees and IR/ER to >/=60 degrees with </= 1/10 pain for functional mobility required for ADLS   Time 8   Period Weeks   Status New   Target Date 11/18/16     PT LONG TERM GOAL #2   Title pt to increase L shoulder to >/= 4/5 in all planes to provide stability and assist with lifting and carrying activities    Time 8   Period Weeks   Status New   Target Date 11/18/16     PT LONG TERM GOAL #3   Title pt to be able to lift and lower >/= 8# to and from and over head shelf and push/pull >/= 15# with </= 1/10 pain for functional strength required for ADLs    Time 8   Period Weeks   Status New   Target Date 11/18/16     PT LONG TERM GOAL #4   Title increase FOTO score to </=30% to demo improvement in function    Time 8   Period Weeks   Status New   Target Date 11/18/16     PT LONG TERM GOAL #5   Title pt to be I with all HEp given as of last visit    Time 8   Period Weeks   Status New   Target Date 11/18/16               Plan - 09/28/16 1421    Clinical Impression Statement Patients ER is tight but appears to be progressing. He tolerated treatment well. he had no increase in pain. He was educated on the improtance of letting his shoulder heal.    Clinical Presentation Stable   Clinical Decision Making Low   Rehab Potential Good   PT Frequency 2x / week   PT Duration 8 weeks   PT Treatment/Interventions ADLs/Self Care Home Management;Cryotherapy;Electrical Stimulation;Iontophoresis 4mg /ml Dexamethasone;Moist Heat;Ultrasound;Therapeutic activities;Therapeutic exercise;Taping;Manual techniques;Passive range of motion;Patient/family education;Neuromuscular  re-education;Vasopneumatic Device   PT Next Visit  Plan review and update HEP PRN, protocol for RCR, PROM, grip strength, scapular stability, bicep activation, GHJ/ scapular mobs, modalities PRN   PT Home Exercise Plan scapular retraction, table slides flexion/ abduction   Consulted and Agree with Plan of Care Patient      Patient will benefit from skilled therapeutic intervention in order to improve the following deficits and impairments:  Pain, Improper body mechanics, Postural dysfunction, Decreased strength, Decreased mobility, Decreased range of motion, Decreased activity tolerance, Decreased endurance, Increased fascial restricitons, Increased muscle spasms, Obesity, Decreased safety awareness  Visit Diagnosis: Acute pain of left shoulder  Stiffness of left shoulder, not elsewhere classified  Muscle weakness (generalized)     Problem List Patient Active Problem List   Diagnosis Date Noted  . Severe obesity (BMI >= 40) (HCC) 09/27/2016  . Dilated aortic root (HCC) 07/20/2016  . Claudication (HCC) 07/20/2016  . Heart murmur 07/20/2016  . Trigger finger, left middle finger 06/21/2016  . Diabetes (HCC) 05/24/2016  . Skin induration 02/19/2016  . Depression, major, recurrent, moderate (HCC) 11/05/2015  . Chronic post-traumatic stress disorder (PTSD) 11/05/2015    Class: Chronic  . OSA (obstructive sleep apnea)   . Persistent atrial fibrillation (HCC) 08/19/2015  . CAD (coronary artery disease), native coronary artery 08/19/2015  . Shoulder pain 06/11/2015  . Hyperlipidemia 06/10/2015  . Tobacco use disorder 05/28/2015  . Essential hypertension 05/27/2015  . Bipolar disorder (HCC) 05/27/2015  . 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 Obstructive sleep apnea 05/27/2015    Dessie Comaavid J Jaydence Vanyo PT DPT  09/28/2016, 2:25 PM  [facility] [address] [place], [place], [address] Phone: [phone]   Fax:   [phone]  Name: Mariana ArnJon Potempa MRN: [id number] Date of Birth: May 12, 1965

## 2016-09-29 ENCOUNTER — Ambulatory Visit: Payer: Medicare HMO | Admitting: Physical Therapy

## 2016-09-29 DIAGNOSIS — M25612 Stiffness of left shoulder, not elsewhere classified: Secondary | ICD-10-CM

## 2016-09-29 DIAGNOSIS — M6281 Muscle weakness (generalized): Secondary | ICD-10-CM

## 2016-09-29 DIAGNOSIS — M25512 Pain in left shoulder: Secondary | ICD-10-CM | POA: Diagnosis not present

## 2016-09-29 LAB — CUP PACEART REMOTE DEVICE CHECK
Battery Remaining Longevity: 144 mo
Brady Statistic RV Percent Paced: 0 %
Date Time Interrogation Session: 20180820111600
HIGH POWER IMPEDANCE MEASURED VALUE: 63 Ohm
Implantable Lead Location: 753860
Lead Channel Pacing Threshold Amplitude: 0.7 V
Lead Channel Setting Pacing Amplitude: 2 V
Lead Channel Setting Pacing Pulse Width: 0.5 ms
MDC IDC LEAD IMPLANT DT: 20160218
MDC IDC LEAD SERIAL: 358834
MDC IDC MSMT BATTERY REMAINING PERCENTAGE: 100 %
MDC IDC MSMT LEADCHNL RV IMPEDANCE VALUE: 771 Ohm
MDC IDC MSMT LEADCHNL RV PACING THRESHOLD PULSEWIDTH: 0.5 ms
MDC IDC PG IMPLANT DT: 20160218
MDC IDC PG SERIAL: 202913
MDC IDC SET LEADCHNL RV SENSING SENSITIVITY: 0.3 mV

## 2016-09-29 NOTE — Therapy (Signed)
[facility] [address] [place], [place], [address] Phone: [phone]   Fax:  [phone]  Physical Therapy Treatment  Patient Details  Name: Kyle Peterson MRN: [id number] Date of Birth: 05-15-1965 Referring Provider: Annell Greening MD  Encounter Date: 09/29/2016      PT End of Session - 09/29/16 2102    Visit Number 3   Number of Visits 17   Date for PT Re-Evaluation 11/18/16   Authorization Type MCR: Kx mod by 15th visit, progress note by 10th or 9/24   PT Start Time 1415   PT Stop Time 1455   PT Time Calculation (min) 40 min   Activity Tolerance Patient tolerated treatment well   Behavior During Therapy [facility] for tasks assessed/performed      Past Medical History:  Diagnosis Date  . AICD (automatic cardioverter/defibrillator) present    bostan scien  . Anginal pain (HCC)    ?heart pain   per dr Estill Dooms  . Anxiety   . Arthritis    ddd  . CAD (coronary artery disease), native coronary artery 08/19/2015  . CHF (congestive heart failure) (HCC)   . Depression   . Diabetes mellitus without complication (HCC)    no meds  new dx  . Heart murmur   . High cholesterol   . Hypertension   . Myocardial infarction (HCC)   . OSA (obstructive sleep apnea)    Severe with AHI 80/hr- new bipap  . PAF (paroxysmal atrial fibrillation) (HCC) 08/19/2015  . PTSD (post-traumatic stress disorder)   . Stroke (HCC) 2016  . Substance abuse     Past Surgical History:  Procedure Laterality Date  . CARDIAC CATHETERIZATION     01/13/14 [facility]: LM NL, mild narrowing of proximal/mid LAD, mid LCX, proximal RCA. 30% ostial OM1. 40% distal RCA. Medical tx.   . CARPAL TUNNEL RELEASE Left 11/02/2015  . CERVICAL DISCECTOMY    . MYOMECTOMY    . SHOULDER ARTHROSCOPY WITH ROTATOR CUFF REPAIR Left 09/09/2016   Procedure: LEFT SHOULDER ARTHROSCOPY WITH MINI OPEN ROTATOR CUFF REPAIR;  Surgeon: Eldred Manges, MD;  Location: [facility] OR;  Service:  Orthopedics;  Laterality: Left;  . TONSILLECTOMY      There were no vitals filed for this visit.      Subjective Assessment - 09/29/16 1420    Subjective Patient reports no soreness. He has been doing his exercises.    Pertinent History hx of MI and DM   Limitations Lifting;House hold activities   How long can you sit comfortably? from back pain 45 - 60 min   How long can you stand comfortably? 15 win   How long can you walk comfortably? 30 min   Diagnostic tests X-ray   Currently in Pain? No/denies            [facility] PT Assessment - 09/29/16 0001      PROM   Left Shoulder Flexion 96 Degrees   Left Shoulder Internal Rotation 55 Degrees   Left Shoulder External Rotation 23 Degrees                             PT Education - 09/29/16 2102    Education provided Yes   Education Details continued education on the improtance of sling use and protecting his shoulder    Person(s) Educated Patient   Methods Explanation;Demonstration;Tactile cues;Verbal cues   Comprehension Verbalized understanding;Returned demonstration;Verbal cues required;Tactile cues required  PT Short Term Goals - 09/23/16 1329      PT SHORT TERM GOAL #1   Title pt to be I with initial HEP   Time 4   Period Weeks   Target Date 10/21/16     PT SHORT TERM GOAL #2   Title pt to increase L shoulder PROM/AAROM to >/= 90 degrees flexion/ abduction and 45 degrees of ER/ IR to promote shoulder mobility with </= 5/10 pain   Time 4   Period Weeks   Status New   Target Date 10/21/16     PT SHORT TERM GOAL #3   Title pt will increase L grip strength by >/= 5# to demo functional improvement in shoulder   Time 4   Period Weeks   Status New   Target Date 10/21/16           PT Long Term Goals - 09/23/16 1332      PT LONG TERM GOAL #1   Title pt to increase L shoulder flexion/ abduction to >/= 120 degrees and IR/ER to >/=60 degrees with </= 1/10 pain for functional mobility  required for ADLS   Time 8   Period Weeks   Status New   Target Date 11/18/16     PT LONG TERM GOAL #2   Title pt to increase L shoulder to >/= 4/5 in all planes to provide stability and assist with lifting and carrying activities    Time 8   Period Weeks   Status New   Target Date 11/18/16     PT LONG TERM GOAL #3   Title pt to be able to lift and lower >/= 8# to and from and over head shelf and push/pull >/= 15# with </= 1/10 pain for functional strength required for ADLs    Time 8   Period Weeks   Status New   Target Date 11/18/16     PT LONG TERM GOAL #4   Title increase FOTO score to </=30% to demo improvement in function    Time 8   Period Weeks   Status New   Target Date 11/18/16     PT LONG TERM GOAL #5   Title pt to be I with all HEp given as of last visit    Time 8   Period Weeks   Status New   Target Date 11/18/16               Plan - 09/29/16 2103    Clinical Impression Statement Improved passive ROM. Therapy focused on mobilizations and PROM today. ER, IR and flexion all improved. He was advised to continue not using his arm. He was advised to peorfrom his exercises at home.    Clinical Presentation Stable   Clinical Decision Making Low   Rehab Potential Good   PT Frequency 2x / week   PT Duration 8 weeks   PT Treatment/Interventions ADLs/Self Care Home Management;Cryotherapy;Electrical Stimulation;Iontophoresis 4mg /ml Dexamethasone;Moist Heat;Ultrasound;Therapeutic activities;Therapeutic exercise;Taping;Manual techniques;Passive range of motion;Patient/family education;Neuromuscular re-education;Vasopneumatic Device   PT Next Visit Plan review and update HEP PRN, protocol for RCR, PROM, grip strength, scapular stability, bicep activation, GHJ/ scapular mobs, modalities PRN   PT Home Exercise Plan scapular retraction, table slides flexion/ abduction   Consulted and Agree with Plan of Care Patient      Patient will benefit from skilled therapeutic  intervention in order to improve the following deficits and impairments:  Pain, Improper body mechanics, Postural dysfunction, Decreased strength, Decreased mobility, Decreased range of  motion, Decreased activity tolerance, Decreased endurance, Increased fascial restricitons, Increased muscle spasms, Obesity, Decreased safety awareness  Visit Diagnosis: Acute pain of left shoulder  Stiffness of left shoulder, not elsewhere classified  Muscle weakness (generalized)     Problem List Patient Active Problem List   Diagnosis Date Noted  . Severe obesity (BMI >= 40) (HCC) 09/27/2016  . Dilated aortic root (HCC) 07/20/2016  . Claudication (HCC) 07/20/2016  . Heart murmur 07/20/2016  . Trigger finger, left middle finger 06/21/2016  . Diabetes (HCC) 05/24/2016  . Skin induration 02/19/2016  . Depression, major, recurrent, moderate (HCC) 11/05/2015  . Chronic post-traumatic stress disorder (PTSD) 11/05/2015    Class: Chronic  . OSA (obstructive sleep apnea)   . Persistent atrial fibrillation (HCC) 08/19/2015  . CAD (coronary artery disease), native coronary artery 08/19/2015  . Shoulder pain 06/11/2015  . Hyperlipidemia 06/10/2015  . Tobacco use disorder 05/28/2015  . Essential hypertension 05/27/2015  . Bipolar disorder (HCC) 05/27/2015  . 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 Obstructive sleep apnea 05/27/2015    Dessie Coma PT DPT  09/29/2016, 9:06 PM  [facility] [address] [place], [place], [address] Phone: [phone]   Fax:  [phone]  Name: Kyle Peterson MRN: [id number] Date of Birth: October 11, 1965

## 2016-09-30 ENCOUNTER — Encounter: Payer: Self-pay | Admitting: Cardiology

## 2016-10-04 ENCOUNTER — Encounter: Payer: Self-pay | Admitting: Physical Therapy

## 2016-10-04 ENCOUNTER — Ambulatory Visit: Payer: Medicare HMO | Attending: Orthopaedic Surgery | Admitting: Physical Therapy

## 2016-10-04 DIAGNOSIS — M25612 Stiffness of left shoulder, not elsewhere classified: Secondary | ICD-10-CM | POA: Diagnosis not present

## 2016-10-04 DIAGNOSIS — M6281 Muscle weakness (generalized): Secondary | ICD-10-CM | POA: Diagnosis not present

## 2016-10-04 DIAGNOSIS — M25512 Pain in left shoulder: Secondary | ICD-10-CM

## 2016-10-04 NOTE — Therapy (Signed)
[facility] [address] [place], [place], [address] Phone: [phone]   Fax:  [phone]  Physical Therapy Treatment  Patient Details  Name: Kyle Peterson MRN: [id number] Date of Birth: 1965/12/02 Referring Provider: Annell GreeningMark Yates MD  Encounter Date: 10/04/2016      PT End of Session - 10/04/16 1409    Visit Number 4   Number of Visits 17   Date for PT Re-Evaluation 11/18/16   Authorization Type MCR: Kx mod by 15th visit, progress note by 10th or 9/24   PT Start Time 1331   PT Stop Time 1413   PT Time Calculation (min) 42 min   Activity Tolerance Patient tolerated treatment well   Behavior During Therapy [facility] for tasks assessed/performed      Past Medical History:  Diagnosis Date  . AICD (automatic cardioverter/defibrillator) present    bostan scien  . Anginal pain (HCC)    ?heart pain   per dr Estill Doomscamntiz  . Anxiety   . Arthritis    ddd  . CAD (coronary artery disease), native coronary artery 08/19/2015  . CHF (congestive heart failure) (HCC)   . Depression   . Diabetes mellitus without complication (HCC)    no meds  new dx  . Heart murmur   . High cholesterol   . Hypertension   . Myocardial infarction (HCC)   . OSA (obstructive sleep apnea)    Severe with AHI 80/hr- new bipap  . PAF (paroxysmal atrial fibrillation) (HCC) 08/19/2015  . PTSD (post-traumatic stress disorder)   . Stroke (HCC) 2016  . Substance abuse     Past Surgical History:  Procedure Laterality Date  . CARDIAC CATHETERIZATION     01/13/14 [facility]: LM NL, mild narrowing of proximal/mid LAD, mid LCX, proximal RCA. 30% ostial OM1. 40% distal RCA. Medical tx.   . CARPAL TUNNEL RELEASE Left 11/02/2015  . CERVICAL DISCECTOMY    . MYOMECTOMY    . SHOULDER ARTHROSCOPY WITH ROTATOR CUFF REPAIR Left 09/09/2016   Procedure: LEFT SHOULDER ARTHROSCOPY WITH MINI OPEN ROTATOR CUFF REPAIR;  Surgeon: Eldred MangesYates, Mark C, MD;  Location: [facility] OR;  Service:  Orthopedics;  Laterality: Left;  . TONSILLECTOMY      There were no vitals filed for this visit.      Subjective Assessment - 10/04/16 1325    Subjective "No issues, just some soreness when I do too much, but I have been more mindfull of my motions"   Currently in Pain? No/denies   Pain Score 0-No pain   Pain Location Shoulder   Pain Orientation Left   Pain Descriptors / Indicators Aching   Pain Type Surgical pain   Pain Frequency Occasional   Aggravating Factors  shoulder movement and quick motions   Pain Relieving Factors stopping and resting                         [facility] Adult PT Treatment/Exercise - 10/04/16 1337      Shoulder Exercises: Seated   Other Seated Exercises elbow flexion x20     Shoulder Exercises: Pulleys   Flexion 3 minutes   ABduction 3 minutes  scaption angle     Shoulder Exercises: ROM/Strengthening   Other ROM/Strengthening Exercises wand ER 2 x 10 holding 3 sec     Shoulder Exercises: Isometric Strengthening   Flexion Supine  1 x 10 with 5 sec hold   Extension Supine;Other (comment)  1 x 10 with 5 sec hold  External Rotation Supine  1 x 10 with 5 sec hold   Internal Rotation Supine;Other (comment)  1 x 10 with 5 sec hold   ABduction Supine;Other (comment)  1 x 10 with 5 sec hold   ADduction Supine  1 x 10 with 5 sec hold     Manual Therapy   Joint Mobilization Grade II and III PA and AP glides to improve ER IR    Passive ROM gentl ePROM into all movements per protocol                 PT Education - 10/04/16 1409    Education provided Yes   Education Details updated HEP for ER using the wand   Person(s) Educated Patient   Methods Explanation;Verbal cues;Handout   Comprehension Verbalized understanding;Verbal cues required          PT Short Term Goals - 09/23/16 1329      PT SHORT TERM GOAL #1   Title pt to be I with initial HEP   Time 4   Period Weeks   Target Date 10/21/16     PT SHORT TERM GOAL  #2   Title pt to increase L shoulder PROM/AAROM to >/= 90 degrees flexion/ abduction and 45 degrees of ER/ IR to promote shoulder mobility with </= 5/10 pain   Time 4   Period Weeks   Status New   Target Date 10/21/16     PT SHORT TERM GOAL #3   Title pt will increase L grip strength by >/= 5# to demo functional improvement in shoulder   Time 4   Period Weeks   Status New   Target Date 10/21/16           PT Long Term Goals - 09/23/16 1332      PT LONG TERM GOAL #1   Title pt to increase L shoulder flexion/ abduction to >/= 120 degrees and IR/ER to >/=60 degrees with </= 1/10 pain for functional mobility required for ADLS   Time 8   Period Weeks   Status New   Target Date 11/18/16     PT LONG TERM GOAL #2   Title pt to increase L shoulder to >/= 4/5 in all planes to provide stability and assist with lifting and carrying activities    Time 8   Period Weeks   Status New   Target Date 11/18/16     PT LONG TERM GOAL #3   Title pt to be able to lift and lower >/= 8# to and from and over head shelf and push/pull >/= 15# with </= 1/10 pain for functional strength required for ADLs    Time 8   Period Weeks   Status New   Target Date 11/18/16     PT LONG TERM GOAL #4   Title increase FOTO score to </=30% to demo improvement in function    Time 8   Period Weeks   Status New   Target Date 11/18/16     PT LONG TERM GOAL #5   Title pt to be I with all HEp given as of last visit    Time 8   Period Weeks   Status New   Target Date 11/18/16               Plan - 10/04/16 1414    Clinical Impression Statement pt continues to report no pain coming into the clinic today. continued working on shoulder mobility via PROM and AAROM via  pulleys. begain gentle isometric strengthening in supine for the shoulder. pt reports mild soreness post session but declined modalities.    PT Next Visit Plan update HEP PRN.  protocol for RCR, PROM, grip strength, scapular stability, bicep  activation, GHJ/ scapular mobs, modalities PRN, if tolerated isometric well give as Hep.    PT Home Exercise Plan scapular retraction, table slides flexion/ abduction   Consulted and Agree with Plan of Care Patient      Patient will benefit from skilled therapeutic intervention in order to improve the following deficits and impairments:  Pain, Improper body mechanics, Postural dysfunction, Decreased strength, Decreased mobility, Decreased range of motion, Decreased activity tolerance, Decreased endurance, Increased fascial restricitons, Increased muscle spasms, Obesity, Decreased safety awareness  Visit Diagnosis: Acute pain of left shoulder  Stiffness of left shoulder, not elsewhere classified  Muscle weakness (generalized)     Problem List Patient Active Problem List   Diagnosis Date Noted  . Severe obesity (BMI >= 40) (HCC) 09/27/2016  . Dilated aortic root (HCC) 07/20/2016  . Claudication (HCC) 07/20/2016  . Heart murmur 07/20/2016  . Trigger finger, left middle finger 06/21/2016  . Diabetes (HCC) 05/24/2016  . Skin induration 02/19/2016  . Depression, major, recurrent, moderate (HCC) 11/05/2015  . Chronic post-traumatic stress disorder (PTSD) 11/05/2015    Class: Chronic  . OSA (obstructive sleep apnea)   . Persistent atrial fibrillation (HCC) 08/19/2015  . CAD (coronary artery disease), native coronary artery 08/19/2015  . Shoulder pain 06/11/2015  . Hyperlipidemia 06/10/2015  . Tobacco use disorder 05/28/2015  . Essential hypertension 05/27/2015  . Bipolar disorder (HCC) 05/27/2015  . 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 Obstructive sleep apnea 05/27/2015   Kyle Peterson PT, DPT, LAT, ATC  10/04/16  2:18 PM      [facility] [address] [place], [place], [address] Phone: [phone]   Fax:  [phone]  Name: Kyle Peterson MRN: [id number] Date of Birth: 08-11-1965

## 2016-10-06 ENCOUNTER — Encounter: Payer: Self-pay | Admitting: Physical Therapy

## 2016-10-06 ENCOUNTER — Ambulatory Visit: Payer: Medicare HMO | Admitting: Physical Therapy

## 2016-10-06 DIAGNOSIS — M6281 Muscle weakness (generalized): Secondary | ICD-10-CM | POA: Diagnosis not present

## 2016-10-06 DIAGNOSIS — M25612 Stiffness of left shoulder, not elsewhere classified: Secondary | ICD-10-CM | POA: Diagnosis not present

## 2016-10-06 DIAGNOSIS — M25512 Pain in left shoulder: Secondary | ICD-10-CM | POA: Diagnosis not present

## 2016-10-06 NOTE — Therapy (Signed)
[facility] [address] [place], [place], [address] Phone: [phone]   Fax:  [phone]  Physical Therapy Treatment  Patient Details  Name: Kyle Peterson MRN: [id number] Date of Birth: Nov 29, 1965 Referring Provider: Annell Greening MD  Encounter Date: 10/06/2016      PT End of Session - 10/06/16 1422    Visit Number 5   Number of Visits 17   Date for PT Re-Evaluation 11/18/16   PT Start Time 1329   PT Stop Time 1413   PT Time Calculation (min) 44 min   Activity Tolerance Patient tolerated treatment well   Behavior During Therapy [facility] for tasks assessed/performed      Past Medical History:  Diagnosis Date  . AICD (automatic cardioverter/defibrillator) present    bostan scien  . Anginal pain (HCC)    ?heart pain   per dr Estill Dooms  . Anxiety   . Arthritis    ddd  . CAD (coronary artery disease), native coronary artery 08/19/2015  . CHF (congestive heart failure) (HCC)   . Depression   . Diabetes mellitus without complication (HCC)    no meds  new dx  . Heart murmur   . High cholesterol   . Hypertension   . Myocardial infarction (HCC)   . OSA (obstructive sleep apnea)    Severe with AHI 80/hr- new bipap  . PAF (paroxysmal atrial fibrillation) (HCC) 08/19/2015  . PTSD (post-traumatic stress disorder)   . Stroke (HCC) 2016  . Substance abuse     Past Surgical History:  Procedure Laterality Date  . CARDIAC CATHETERIZATION     01/13/14 [facility]: LM NL, mild narrowing of proximal/mid LAD, mid LCX, proximal RCA. 30% ostial OM1. 40% distal RCA. Medical tx.   . CARPAL TUNNEL RELEASE Left 11/02/2015  . CERVICAL DISCECTOMY    . MYOMECTOMY    . SHOULDER ARTHROSCOPY WITH ROTATOR CUFF REPAIR Left 09/09/2016   Procedure: LEFT SHOULDER ARTHROSCOPY WITH MINI OPEN ROTATOR CUFF REPAIR;  Surgeon: Eldred Manges, MD;  Location: [facility] OR;  Service: Orthopedics;  Laterality: Left;  . TONSILLECTOMY      There were no vitals filed for  this visit.      Subjective Assessment - 10/06/16 1320    Subjective "last session I wasn't too bad,it was the stuff I did at home after last session"   Currently in Pain? No/denies   Pain Score 2    Pain Location Shoulder   Pain Orientation Left   Pain Descriptors / Indicators Sore   Pain Frequency Intermittent            [facility] PT Assessment - 10/06/16 1336      AROM   Right/Left Shoulder Left   Right Shoulder Flexion --  AAROM   Left Shoulder Flexion 105 Degrees  AAROM  on pulleys   Left Shoulder ABduction --  scaption angle                      [facility] Adult PT Treatment/Exercise - 10/06/16 1332      Shoulder Exercises: Seated   Other Seated Exercises elbow flexion x20     Shoulder Exercises: Pulleys   Flexion 3 minutes   ABduction 3 minutes  Scaption angle     Shoulder Exercises: Isometric Strengthening   Flexion --  1 x 10 with 5 sec hold   Extension --  1 x 10 with 5 sec hold   External Rotation --  1 x 10 with  5 sec hold   Internal Rotation --  1 x 10 with 5 sec hold     Manual Therapy   Manual Therapy Soft tissue mobilization   Manual therapy comments desensitization techniques over the incision    Soft tissue mobilization IASTM over the deltoid anterior/ middle portions                PT Education - 10/06/16 1422    Education provided Yes   Education Details updated HEP for isometric shoulder strengthening.    Person(s) Educated Patient   Methods Explanation;Verbal cues   Comprehension Verbalized understanding;Verbal cues required          PT Short Term Goals - 09/23/16 1329      PT SHORT TERM GOAL #1   Title pt to be I with initial HEP   Time 4   Period Weeks   Target Date 10/21/16     PT SHORT TERM GOAL #2   Title pt to increase L shoulder PROM/AAROM to >/= 90 degrees flexion/ abduction and 45 degrees of ER/ IR to promote shoulder mobility with </= 5/10 pain   Time 4   Period Weeks   Status New   Target  Date 10/21/16     PT SHORT TERM GOAL #3   Title pt will increase L grip strength by >/= 5# to demo functional improvement in shoulder   Time 4   Period Weeks   Status New   Target Date 10/21/16           PT Long Term Goals - 09/23/16 1332      PT LONG TERM GOAL #1   Title pt to increase L shoulder flexion/ abduction to >/= 120 degrees and IR/ER to >/=60 degrees with </= 1/10 pain for functional mobility required for ADLS   Time 8   Period Weeks   Status New   Target Date 11/18/16     PT LONG TERM GOAL #2   Title pt to increase L shoulder to >/= 4/5 in all planes to provide stability and assist with lifting and carrying activities    Time 8   Period Weeks   Status New   Target Date 11/18/16     PT LONG TERM GOAL #3   Title pt to be able to lift and lower >/= 8# to and from and over head shelf and push/pull >/= 15# with </= 1/10 pain for functional strength required for ADLs    Time 8   Period Weeks   Status New   Target Date 11/18/16     PT LONG TERM GOAL #4   Title increase FOTO score to </=30% to demo improvement in function    Time 8   Period Weeks   Status New   Target Date 11/18/16     PT LONG TERM GOAL #5   Title pt to be I with all HEp given as of last visit    Time 8   Period Weeks   Status New   Target Date 11/18/16               Plan - 10/06/16 1423    Clinical Impression Statement pt reports mild soreness in the shoulder with increased sensitivity, over the incision in the shoulder. Continued AAROM exercise and IASTM over the deltoid. added isometric exericses to his HEP. post session he reported decreased soreness and sensitivity at the shoulder.    PT Treatment/Interventions ADLs/Self Care Home Management;Cryotherapy;Electrical Stimulation;Iontophoresis /ml Dexamethasone;Moist Heat;Ultrasound;Therapeutic  activities;Therapeutic exercise;Taping;Manual techniques;Passive range of motion;Patient/family education;Neuromuscular  re-education;Vasopneumatic Device   PT Next Visit Plan update HEP PRN.  protocol for RCR, PROM/ AAROM, capular stability, bicep activation, GHJ/ scapular mobs, modalities PRN, if tolerated isometric well give as Hep.    PT Home Exercise Plan scapular retraction, table slides flexion/ abduction, Isometric strengthening IR/ER/ flexion/ Extension   Consulted and Agree with Plan of Care Patient      Patient will benefit from skilled therapeutic intervention in order to improve the following deficits and impairments:  Pain, Improper body mechanics, Postural dysfunction, Decreased strength, Decreased mobility, Decreased range of motion, Decreased activity tolerance, Decreased endurance, Increased fascial restricitons, Increased muscle spasms, Obesity, Decreased safety awareness  Visit Diagnosis: Acute pain of left shoulder  Stiffness of left shoulder, not elsewhere classified  Muscle weakness (generalized)     Problem List Patient Active Problem List   Diagnosis Date Noted  . Severe obesity (BMI >= 40) (HCC) 09/27/2016  . Dilated aortic root (HCC) 07/20/2016  . Claudication (HCC) 07/20/2016  . Heart murmur 07/20/2016  . Trigger finger, left middle finger 06/21/2016  . Diabetes (HCC) 05/24/2016  . Skin induration 02/19/2016  . Depression, major, recurrent, moderate (HCC) 11/05/2015  . Chronic post-traumatic stress disorder (PTSD) 11/05/2015    Class: Chronic  . OSA (obstructive sleep apnea)   . Persistent atrial fibrillation (HCC) 08/19/2015  . CAD (coronary artery disease), native coronary artery 08/19/2015  . Shoulder pain 06/11/2015  . Hyperlipidemia 06/10/2015  . Tobacco use disorder 05/28/2015  . Essential hypertension 05/27/2015  . Bipolar disorder (HCC) 05/27/2015  . 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 Obstructive sleep apnea 05/27/2015   Lulu RidingKristoffer Lorianna Spadaccini PT, DPT, LAT, ATC  10/06/16  2:30 PM      [facility] [address] [place], [place], [address] Phone: [phone]   Fax:  [phone]  Name: Kyle ArnJon Peterson MRN: [id number] Date of Birth: 1965/07/07

## 2016-10-10 ENCOUNTER — Encounter: Payer: Self-pay | Admitting: Physical Therapy

## 2016-10-10 ENCOUNTER — Other Ambulatory Visit: Payer: Medicare HMO | Admitting: *Deleted

## 2016-10-10 ENCOUNTER — Ambulatory Visit: Payer: Medicare HMO | Admitting: Physical Therapy

## 2016-10-10 DIAGNOSIS — E785 Hyperlipidemia, unspecified: Secondary | ICD-10-CM | POA: Diagnosis not present

## 2016-10-10 DIAGNOSIS — M25512 Pain in left shoulder: Secondary | ICD-10-CM

## 2016-10-10 DIAGNOSIS — M25612 Stiffness of left shoulder, not elsewhere classified: Secondary | ICD-10-CM | POA: Diagnosis not present

## 2016-10-10 DIAGNOSIS — M6281 Muscle weakness (generalized): Secondary | ICD-10-CM | POA: Diagnosis not present

## 2016-10-10 DIAGNOSIS — I2510 Atherosclerotic heart disease of native coronary artery without angina pectoris: Secondary | ICD-10-CM | POA: Diagnosis not present

## 2016-10-10 LAB — LIPID PANEL
CHOL/HDL RATIO: 3.3 ratio (ref 0.0–5.0)
Cholesterol, Total: 112 mg/dL (ref 100–199)
HDL: 34 mg/dL — ABNORMAL LOW (ref >39)
LDL CALC: 60 mg/dL (ref 0–99)
TRIGLYCERIDES: 89 mg/dL (ref 0–149)
VLDL CHOLESTEROL CAL: 18 mg/dL (ref 5–40)

## 2016-10-10 LAB — ALT: ALT: 15 IU/L (ref 0–44)

## 2016-10-10 NOTE — Therapy (Signed)
[facility] [address] [place], [place], [address] Phone: [phone]   Fax:  [phone]  Physical Therapy Treatment  Patient Details  Name: Kyle Peterson MRN: [id number] Date of Birth: Apr 19, 1965 Referring Provider: Annell Greening MD  Encounter Date: 10/10/2016      PT End of Session - 10/10/16 1245    Visit Number 6   Number of Visits 17   Date for PT Re-Evaluation 11/18/16   Authorization Type MCR: Kx mod by 15th visit, progress note by 10th or 9/24   PT Start Time 1145   PT Stop Time 1226   PT Time Calculation (min) 41 min   Activity Tolerance Patient tolerated treatment well   Behavior During Therapy [facility] for tasks assessed/performed      Past Medical History:  Diagnosis Date  . AICD (automatic cardioverter/defibrillator) present    bostan scien  . Anginal pain (HCC)    ?heart pain   per dr Kyle Peterson  . Anxiety   . Arthritis    ddd  . CAD (coronary artery disease), native coronary artery 08/19/2015  . CHF (congestive heart failure) (HCC)   . Depression   . Diabetes mellitus without complication (HCC)    no meds  new dx  . Heart murmur   . High cholesterol   . Hypertension   . Myocardial infarction (HCC)   . OSA (obstructive sleep apnea)    Severe with AHI 80/hr- new bipap  . PAF (paroxysmal atrial fibrillation) (HCC) 08/19/2015  . PTSD (post-traumatic stress disorder)   . Stroke (HCC) 2016  . Substance abuse     Past Surgical History:  Procedure Laterality Date  . CARDIAC CATHETERIZATION     01/13/14 [facility]: LM NL, mild narrowing of proximal/mid LAD, mid LCX, proximal RCA. 30% ostial OM1. 40% distal RCA. Medical tx.   . CARPAL TUNNEL RELEASE Left 11/02/2015  . CERVICAL DISCECTOMY    . MYOMECTOMY    . SHOULDER ARTHROSCOPY WITH ROTATOR CUFF REPAIR Left 09/09/2016   Procedure: LEFT SHOULDER ARTHROSCOPY WITH MINI OPEN ROTATOR CUFF REPAIR;  Surgeon: Kyle Manges, MD;  Location: [facility] OR;  Service:  Orthopedics;  Laterality: Left;  . TONSILLECTOMY      There were no vitals filed for this visit.      Subjective Assessment - 10/10/16 1150    Subjective "Im not doing too bad, still some soreness overw the incision inthe top of the shoulder"   Currently in Pain? Yes   Pain Score 1                          [facility] Adult PT Treatment/Exercise - 10/10/16 1150      Shoulder Exercises: Supine   Protraction Both;15 reps  AAROM with dowel rod     Shoulder Exercises: Pulleys   Flexion 3 minutes   ABduction 3 minutes  scaption angle     Shoulder Exercises: ROM/Strengthening   Other ROM/Strengthening Exercises wand AAROM flexion 2 x 15  in supine     Manual Therapy   Manual Therapy Scapular mobilization   Manual therapy comments manual trigger point release over the L pec minor    Soft tissue mobilization IASTM over the deltoid anterior/ middle portions   Scapular Mobilization in R sidelying grade 3 in all planes                  PT Short Term Goals - 09/23/16 1329  PT SHORT TERM GOAL #1   Title pt to be I with initial HEP   Time 4   Period Weeks   Target Date 10/21/16     PT SHORT TERM GOAL #2   Title pt to increase L shoulder PROM/AAROM to >/= 90 degrees flexion/ abduction and 45 degrees of ER/ IR to promote shoulder mobility with </= 5/10 pain   Time 4   Period Weeks   Status New   Target Date 10/21/16     PT SHORT TERM GOAL #3   Title pt will increase L grip strength by >/= 5# to demo functional improvement in shoulder   Time 4   Period Weeks   Status New   Target Date 10/21/16           PT Long Term Goals - 09/23/16 1332      PT LONG TERM GOAL #1   Title pt to increase L shoulder flexion/ abduction to >/= 120 degrees and IR/ER to >/=60 degrees with </= 1/10 pain for functional mobility required for ADLS   Time 8   Period Weeks   Status New   Target Date 11/18/16     PT LONG TERM GOAL #2   Title pt to increase L  shoulder to >/= 4/5 in all planes to provide stability and assist with lifting and carrying activities    Time 8   Period Weeks   Status New   Target Date 11/18/16     PT LONG TERM GOAL #3   Title pt to be able to lift and lower >/= 8# to and from and over head shelf and push/pull >/= 15# with </= 1/10 pain for functional strength required for ADLs    Time 8   Period Weeks   Status New   Target Date 11/18/16     PT LONG TERM GOAL #4   Title increase FOTO score to </=30% to demo improvement in function    Time 8   Period Weeks   Status New   Target Date 11/18/16     PT LONG TERM GOAL #5   Title pt to be I with all HEp given as of last visit    Time 8   Period Weeks   Status New   Target Date 11/18/16               Plan - 10/10/16 1246    Clinical Impression Statement [name] reports minimal soreness in the L shoulder. progressed AAROM exericses today which he perofrmed well. begain scapular mobs today to promote overall shoulder mobility. post session he reported decreased pain and tightness.    PT Next Visit Plan update HEP PRN.  protocol for RCR, AAROM for the shoulder, capular stability, bicep activation, GHJ/ scapular mobs, modalities    PT Home Exercise Plan scapular retraction, table slides flexion/ abduction, Isometric strengthening IR/ER/ flexion/ Extension   Consulted and Agree with Plan of Care Patient      Patient will benefit from skilled therapeutic intervention in order to improve the following deficits and impairments:     Visit Diagnosis: Acute pain of left shoulder  Stiffness of left shoulder, not elsewhere classified  Muscle weakness (generalized)     Problem List Patient Active Problem List   Diagnosis Date Noted  . Severe obesity (BMI >= 40) (HCC) 09/27/2016  . Dilated aortic root (HCC) 07/20/2016  . Claudication (HCC) 07/20/2016  . Heart murmur 07/20/2016  . Trigger finger, left middle finger 06/21/2016  .  Diabetes (HCC) 05/24/2016   . Skin induration 02/19/2016  . Depression, major, recurrent, moderate (HCC) 11/05/2015  . Chronic post-traumatic stress disorder (PTSD) 11/05/2015    Class: Chronic  . OSA (obstructive sleep apnea)   . Persistent atrial fibrillation (HCC) 08/19/2015  . CAD (coronary artery disease), native coronary artery 08/19/2015  . Shoulder pain 06/11/2015  . Hyperlipidemia 06/10/2015  . Tobacco use disorder 05/28/2015  . Essential hypertension 05/27/2015  . Bipolar disorder (HCC) 05/27/2015  . 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 Obstructive sleep apnea 05/27/2015   Lulu Riding PT, DPT, LAT, ATC  10/10/16  12:48 PM      [facility] [address] [place], [place], [address] Phone: [phone]   Fax:  [phone]  Name: Kyle Peterson MRN: [id number] Date of Birth: Sep 02, 1965

## 2016-10-12 ENCOUNTER — Encounter: Payer: Self-pay | Admitting: Physical Therapy

## 2016-10-12 ENCOUNTER — Ambulatory Visit: Payer: Medicare HMO | Admitting: Physical Therapy

## 2016-10-12 DIAGNOSIS — M6281 Muscle weakness (generalized): Secondary | ICD-10-CM | POA: Diagnosis not present

## 2016-10-12 DIAGNOSIS — M25612 Stiffness of left shoulder, not elsewhere classified: Secondary | ICD-10-CM | POA: Diagnosis not present

## 2016-10-12 DIAGNOSIS — M25512 Pain in left shoulder: Secondary | ICD-10-CM | POA: Diagnosis not present

## 2016-10-12 NOTE — Therapy (Signed)
[facility] [address] [place], [place], [address] Phone: [phone]   Fax:  [phone]  Physical Therapy Treatment  Patient Details  Name: Kyle Peterson MRN: [id number] Date of Birth: Oct 06, 1965 Referring Provider: Annell Greening MD  Encounter Date: 10/12/2016      PT End of Session - 10/12/16 1335    Visit Number 7   Number of Visits 17   Date for PT Re-Evaluation 11/18/16   Authorization Type MCR: Kx mod by 15th visit, progress note by 10th or 9/24   PT Start Time 1330   PT Stop Time 1413   PT Time Calculation (min) 43 min   Activity Tolerance Patient tolerated treatment well   Behavior During Therapy [facility] for tasks assessed/performed      Past Medical History:  Diagnosis Date  . AICD (automatic cardioverter/defibrillator) present    bostan scien  . Anginal pain (HCC)    ?heart pain   per dr Estill Dooms  . Anxiety   . Arthritis    ddd  . CAD (coronary artery disease), native coronary artery 08/19/2015  . CHF (congestive heart failure) (HCC)   . Depression   . Diabetes mellitus without complication (HCC)    no meds  new dx  . Heart murmur   . High cholesterol   . Hypertension   . Myocardial infarction (HCC)   . OSA (obstructive sleep apnea)    Severe with AHI 80/hr- new bipap  . PAF (paroxysmal atrial fibrillation) (HCC) 08/19/2015  . PTSD (post-traumatic stress disorder)   . Stroke (HCC) 2016  . Substance abuse     Past Surgical History:  Procedure Laterality Date  . CARDIAC CATHETERIZATION     01/13/14 [facility]: LM NL, mild narrowing of proximal/mid LAD, mid LCX, proximal RCA. 30% ostial OM1. 40% distal RCA. Medical tx.   . CARPAL TUNNEL RELEASE Left 11/02/2015  . CERVICAL DISCECTOMY    . MYOMECTOMY    . SHOULDER ARTHROSCOPY WITH ROTATOR CUFF REPAIR Left 09/09/2016   Procedure: LEFT SHOULDER ARTHROSCOPY WITH MINI OPEN ROTATOR CUFF REPAIR;  Surgeon: Eldred Manges, MD;  Location: [facility] OR;  Service:  Orthopedics;  Laterality: Left;  . TONSILLECTOMY      There were no vitals filed for this visit.      Subjective Assessment - 10/12/16 1330    Subjective "My shoulder is doing well, my back is giving me more trouble"    Currently in Pain? No/denies   Pain Score 0-No pain   Aggravating Factors  reaching to the L    Pain Relieving Factors resting            [facility] PT Assessment - 10/12/16 1340      Observation/Other Assessments   Focus on Therapeutic Outcomes (FOTO)  38% limited                     [facility] Adult PT Treatment/Exercise - 10/12/16 1333      Shoulder Exercises: Seated   Other Seated Exercises bicep curl 2 x 10 3#     Shoulder Exercises: Standing   External Rotation AROM;10 reps;Theraband;Left;Strengthening   Theraband Level (Shoulder External Rotation) Level 1 (Yellow)   Internal Rotation Strengthening;Left;10 reps;Theraband   Theraband Level (Shoulder Internal Rotation) Level 1 (Yellow)     Shoulder Exercises: Pulleys   Flexion 2 minutes   ABduction 2 minutes  scaption      Shoulder Exercises: ROM/Strengthening   Other ROM/Strengthening Exercises wand flexion 2 x 10,  abduction 2 x 10     Shoulder Exercises: Stretch   Corner Stretch 2 reps;30 seconds                PT Education - 10/12/16 1348    Education provided Yes   Education Details updated HEP for shoulder AAROM using rod dowel   Person(s) Educated Patient   Methods Explanation;Verbal cues;Handout   Comprehension Verbalized understanding;Verbal cues required          PT Short Term Goals - 09/23/16 1329      PT SHORT TERM GOAL #1   Title pt to be I with initial HEP   Time 4   Period Weeks   Target Date 10/21/16     PT SHORT TERM GOAL #2   Title pt to increase L shoulder PROM/AAROM to >/= 90 degrees flexion/ abduction and 45 degrees of ER/ IR to promote shoulder mobility with </= 5/10 pain   Time 4   Period Weeks   Status New   Target Date 10/21/16     PT  SHORT TERM GOAL #3   Title pt will increase L grip strength by >/= 5# to demo functional improvement in shoulder   Time 4   Period Weeks   Status New   Target Date 10/21/16           PT Long Term Goals - 09/23/16 1332      PT LONG TERM GOAL #1   Title pt to increase L shoulder flexion/ abduction to >/= 120 degrees and IR/ER to >/=60 degrees with </= 1/10 pain for functional mobility required for ADLS   Time 8   Period Weeks   Status New   Target Date 11/18/16     PT LONG TERM GOAL #2   Title pt to increase L shoulder to >/= 4/5 in all planes to provide stability and assist with lifting and carrying activities    Time 8   Period Weeks   Status New   Target Date 11/18/16     PT LONG TERM GOAL #3   Title pt to be able to lift and lower >/= 8# to and from and over head shelf and push/pull >/= 15# with </= 1/10 pain for functional strength required for ADLs    Time 8   Period Weeks   Status New   Target Date 11/18/16     PT LONG TERM GOAL #4   Title increase FOTO score to </=30% to demo improvement in function    Time 8   Period Weeks   Status New   Target Date 11/18/16     PT LONG TERM GOAL #5   Title pt to be I with all HEp given as of last visit    Time 8   Period Weeks   Status New   Target Date 11/18/16               Plan - 10/12/16 1407    Clinical Impression Statement pt continues to report no pain just minimal soreness in the L shoulder. continues AAROM exercises to improve shoulder mobility using wand. began progression of isotonics with IR/ER and Rows. pt reports no pain and that he is doing well.    PT Next Visit Plan update HEP PRN.  protocol for RCR, AAROM for the shoulder, scapular stability, bicep activation, GHJ/ scapular mobs, modalities update HEP for IR/ER and rows isotonics with yellow band   PT Home Exercise Plan scapular retraction, table slides flexion/  abduction, Isometric strengthening IR/ER/ flexion/ Extension, wand flexion/ abduction  in standing   Consulted and Agree with Plan of Care Patient      Patient will benefit from skilled therapeutic intervention in order to improve the following deficits and impairments:  Pain, Improper body mechanics, Postural dysfunction, Decreased strength, Decreased mobility, Decreased range of motion, Decreased activity tolerance, Decreased endurance, Increased fascial restricitons, Increased muscle spasms, Obesity, Decreased safety awareness  Visit Diagnosis: Acute pain of left shoulder  Muscle weakness (generalized)  Stiffness of left shoulder, not elsewhere classified     Problem List Patient Active Problem List   Diagnosis Date Noted  . Severe obesity (BMI >= 40) (HCC) 09/27/2016  . Dilated aortic root (HCC) 07/20/2016  . Claudication (HCC) 07/20/2016  . Heart murmur 07/20/2016  . Trigger finger, left middle finger 06/21/2016  . Diabetes (HCC) 05/24/2016  . Skin induration 02/19/2016  . Depression, major, recurrent, moderate (HCC) 11/05/2015  . Chronic post-traumatic stress disorder (PTSD) 11/05/2015    Class: Chronic  . OSA (obstructive sleep apnea)   . Persistent atrial fibrillation (HCC) 08/19/2015  . CAD (coronary artery disease), native coronary artery 08/19/2015  . Shoulder pain 06/11/2015  . Hyperlipidemia 06/10/2015  . Tobacco use disorder 05/28/2015  . Essential hypertension 05/27/2015  . Bipolar disorder (HCC) 05/27/2015  . 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 Obstructive sleep apnea 05/27/2015   Lulu RidingKristoffer Leamon PT, DPT, LAT, ATC  10/12/16  2:15 PM      [facility] [address] [place], [place], [address] Phone: [phone]   Fax:  [phone]  Name: Kyle Peterson MRN: [id number] Date of Birth: [birth year]

## 2016-10-13 ENCOUNTER — Other Ambulatory Visit: Payer: Self-pay

## 2016-10-13 MED ORDER — ATORVASTATIN CALCIUM 20 MG PO TABS: 20.0000 mg | ORAL_TABLET | Freq: Every day | ORAL | 11 refills | Status: DC

## 2016-10-17 ENCOUNTER — Encounter: Payer: Self-pay | Admitting: Physical Therapy

## 2016-10-17 ENCOUNTER — Ambulatory Visit: Payer: Medicare HMO | Admitting: Physical Therapy

## 2016-10-17 ENCOUNTER — Other Ambulatory Visit: Payer: Self-pay

## 2016-10-17 DIAGNOSIS — M25612 Stiffness of left shoulder, not elsewhere classified: Secondary | ICD-10-CM

## 2016-10-17 DIAGNOSIS — M6281 Muscle weakness (generalized): Secondary | ICD-10-CM

## 2016-10-17 DIAGNOSIS — M25512 Pain in left shoulder: Secondary | ICD-10-CM | POA: Diagnosis not present

## 2016-10-17 MED ORDER — LOSARTAN POTASSIUM 50 MG PO TABS: 50.0000 mg | ORAL_TABLET | Freq: Every day | ORAL | 2 refills | Status: DC

## 2016-10-17 MED ORDER — FUROSEMIDE 20 MG PO TABS: ORAL_TABLET | ORAL | 11 refills | Status: DC

## 2016-10-17 MED ORDER — ATORVASTATIN CALCIUM 20 MG PO TABS: 20.0000 mg | ORAL_TABLET | Freq: Every day | ORAL | 11 refills | Status: DC

## 2016-10-18 MED ORDER — ATORVASTATIN CALCIUM 20 MG PO TABS: 20.0000 mg | ORAL_TABLET | Freq: Every day | ORAL | 2 refills | Status: DC

## 2016-10-18 NOTE — Therapy (Signed)
[facility] [address] [place], [place], [address] Phone: [phone]   Fax:  [phone]  Physical Therapy Treatment  Patient Details  Name: Kyle Peterson MRN: [id number] Date of Birth: [birth year] Referring Provider: Annell Greening MD  Encounter Date: 10/17/2016      PT End of Session - 10/17/16 1729    Visit Number 8   Number of Visits 17   Date for PT Re-Evaluation 11/18/16   Authorization Type MCR: Kx mod by 15th visit, progress note by 10th or 9/24   PT Start Time 1330   PT Stop Time 1413   PT Time Calculation (min) 43 min   Activity Tolerance Patient tolerated treatment well   Behavior During Therapy [facility] for tasks assessed/performed      Past Medical History:  Diagnosis Date  . AICD (automatic cardioverter/defibrillator) present    bostan scien  . Anginal pain (HCC)    ?heart pain   per dr Estill Dooms  . Anxiety   . Arthritis    ddd  . CAD (coronary artery disease), native coronary artery 08/19/2015  . CHF (congestive heart failure) (HCC)   . Depression   . Diabetes mellitus without complication (HCC)    no meds  new dx  . Heart murmur   . High cholesterol   . Hypertension   . Myocardial infarction (HCC)   . OSA (obstructive sleep apnea)    Severe with AHI 80/hr- new bipap  . PAF (paroxysmal atrial fibrillation) (HCC) 08/19/2015  . PTSD (post-traumatic stress disorder)   . Stroke (HCC) 2016  . Substance abuse     Past Surgical History:  Procedure Laterality Date  . CARDIAC CATHETERIZATION     01/13/14 [facility]: LM NL, mild narrowing of proximal/mid LAD, mid LCX, proximal RCA. 30% ostial OM1. 40% distal RCA. Medical tx.   . CARPAL TUNNEL RELEASE Left 11/02/2015  . CERVICAL DISCECTOMY    . MYOMECTOMY    . SHOULDER ARTHROSCOPY WITH ROTATOR CUFF REPAIR Left 09/09/2016   Procedure: LEFT SHOULDER ARTHROSCOPY WITH MINI OPEN ROTATOR CUFF REPAIR;  Surgeon: Eldred Manges, MD;  Location: [facility] OR;  Service:  Orthopedics;  Laterality: Left;  . TONSILLECTOMY      There were no vitals filed for this visit.      Subjective Assessment - 10/17/16 1723    Subjective Patient reports minor soreness after the last visit but overall he feels like he is progressing well. His C/O is pain in his back.    Pertinent History hx of MI and DM   Limitations Lifting;House hold activities   How long can you sit comfortably? from back pain 45 - 60 min   How long can you stand comfortably? 15 win   Currently in Pain? Yes   Pain Score 3    Pain Location Shoulder   Pain Orientation Left   Pain Descriptors / Indicators Sore   Pain Type Surgical pain   Pain Onset More than a month ago   Pain Frequency Intermittent   Aggravating Factors  reaching to the left    Pain Relieving Factors resting                          [facility] Adult PT Treatment/Exercise - 10/18/16 0001      Shoulder Exercises: Supine   Other Supine Exercises wand flexion; seated wand ER 2nd to back     Shoulder Exercises: Standing   External Rotation AROM;10 reps;Theraband;Left;Strengthening  Theraband Level (Shoulder External Rotation) Level 1 (Yellow)   Internal Rotation Strengthening;Left;10 reps;Theraband   Theraband Level (Shoulder Internal Rotation) Level 1 (Yellow)   Extension 20 reps  2x10   Theraband Level (Shoulder Extension) Level 2 (Red)   Row 20 reps  2x10    Theraband Level (Shoulder Row) Level 2 (Red)     Shoulder Exercises: Pulleys   Flexion 2 minutes   ABduction 2 minutes  scaption      Manual Therapy   Joint Mobilization Grade II and III PA and AP glides to improve ER IR    Passive ROM PROM in all planes                PT Education - 10/17/16 1728    Education provided Yes   Education Details reviewed HEP ; given band for exercises at home.    Person(s) Educated Patient   Methods Explanation;Demonstration;Handout   Comprehension Returned demonstration;Verbalized understanding           PT Short Term Goals - 10/18/16 0810      PT SHORT TERM GOAL #1   Title pt to be I with initial HEP   Time 4   Period Weeks   Status Achieved     PT SHORT TERM GOAL #2   Title pt to increase L shoulder PROM/AAROM to >/= 90 degrees flexion/ abduction and 45 degrees of ER/ IR to promote shoulder mobility with </= 5/10 pain   Time 4   Period Weeks   Status Achieved     PT SHORT TERM GOAL #3   Title pt will increase L grip strength by >/= 5# to demo functional improvement in shoulder   Time 4   Period Weeks   Status On-going           PT Long Term Goals - 09/23/16 1332      PT LONG TERM GOAL #1   Title pt to increase L shoulder flexion/ abduction to >/= 120 degrees and IR/ER to >/=60 degrees with </= 1/10 pain for functional mobility required for ADLS   Time 8   Period Weeks   Status New   Target Date 11/18/16     PT LONG TERM GOAL #2   Title pt to increase L shoulder to >/= 4/5 in all planes to provide stability and assist with lifting and carrying activities    Time 8   Period Weeks   Status New   Target Date 11/18/16     PT LONG TERM GOAL #3   Title pt to be able to lift and lower >/= 8# to and from and over head shelf and push/pull >/= 15# with </= 1/10 pain for functional strength required for ADLs    Time 8   Period Weeks   Status New   Target Date 11/18/16     PT LONG TERM GOAL #4   Title increase FOTO score to </=30% to demo improvement in function    Time 8   Period Weeks   Status New   Target Date 11/18/16     PT LONG TERM GOAL #5   Title pt to be I with all HEp given as of last visit    Time 8   Period Weeks   Status New   Target Date 11/18/16               Plan - 10/18/16 0804    Clinical Impression Statement Patient continues to make progress. His PROM is  improving. Therapy reviewed scapualr strengthening and t-band exercises. If he perfroms ER/IR well next visit give for home. Patient given scpaular strengthneing for HEP/      Clinical Presentation Stable   Clinical Decision Making Low   Rehab Potential Good   PT Frequency 2x / week   PT Duration 8 weeks   PT Treatment/Interventions ADLs/Self Care Home Management;Cryotherapy;Electrical Stimulation;Iontophoresis /ml Dexamethasone;Moist Heat;Ultrasound;Therapeutic activities;Therapeutic exercise;Taping;Manual techniques;Passive range of motion;Patient/family education;Neuromuscular re-education;Vasopneumatic Device   PT Next Visit Plan update HEP PRN.  protocol for RCR, AAROM for the shoulder, scapular stability, bicep activation, GHJ/ scapular mobs, modalities update HEP for IR/ER and rows isotonics with yellow band   PT Home Exercise Plan scapular retraction, table slides flexion/ abduction, Isometric strengthening IR/ER/ flexion/ Extension, wand flexion/ abduction in standing   Consulted and Agree with Plan of Care Patient      Patient will benefit from skilled therapeutic intervention in order to improve the following deficits and impairments:  Pain, Improper body mechanics, Postural dysfunction, Decreased strength, Decreased mobility, Decreased range of motion, Decreased activity tolerance, Decreased endurance, Increased fascial restricitons, Increased muscle spasms, Obesity, Decreased safety awareness  Visit Diagnosis: Acute pain of left shoulder  Stiffness of left shoulder, not elsewhere classified  Muscle weakness (generalized)     Problem List Patient Active Problem List   Diagnosis Date Noted  . Severe obesity (BMI >= 40) (HCC) 09/27/2016  . Dilated aortic root (HCC) 07/20/2016  . Claudication (HCC) 07/20/2016  . Heart murmur 07/20/2016  . Trigger finger, left middle finger 06/21/2016  . Diabetes (HCC) 05/24/2016  . Skin induration 02/19/2016  . Depression, major, recurrent, moderate (HCC) 11/05/2015  . Chronic post-traumatic stress disorder (PTSD) 11/05/2015    Class: Chronic  . OSA (obstructive sleep apnea)   . Persistent atrial  fibrillation (HCC) 08/19/2015  . CAD (coronary artery disease), native coronary artery 08/19/2015  . Shoulder pain 06/11/2015  . Hyperlipidemia 06/10/2015  . Tobacco use disorder 05/28/2015  . Essential hypertension 05/27/2015  . Bipolar disorder (HCC) 05/27/2015  . 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 Obstructive sleep apnea 05/27/2015    Dessie Coma PT DPT  10/18/2016, 8:17 AM  [facility] [address] [place], [place], [address] Phone: [phone]   Fax:  [phone]  Name: Kyle Peterson MRN: [id number] Date of Birth: [birth year]

## 2016-10-18 NOTE — Addendum Note (Signed)
Addended by: Demetrios Loll on: 10/18/2016 10:27 AM   Modules accepted: Orders

## 2016-10-19 ENCOUNTER — Encounter: Payer: Self-pay | Admitting: Family Medicine

## 2016-10-19 ENCOUNTER — Ambulatory Visit: Payer: Medicare HMO | Admitting: Physical Therapy

## 2016-10-20 DIAGNOSIS — G4733 Obstructive sleep apnea (adult) (pediatric): Secondary | ICD-10-CM | POA: Diagnosis not present

## 2016-10-21 ENCOUNTER — Other Ambulatory Visit: Payer: Self-pay | Admitting: Cardiology

## 2016-10-21 MED ORDER — LOSARTAN POTASSIUM 50 MG PO TABS: 50.0000 mg | ORAL_TABLET | Freq: Every day | ORAL | 2 refills | Status: DC

## 2016-10-21 MED ORDER — FUROSEMIDE 20 MG PO TABS: 20.0000 mg | ORAL_TABLET | Freq: Every day | ORAL | 2 refills | Status: DC

## 2016-10-24 ENCOUNTER — Ambulatory Visit: Payer: Medicare HMO | Admitting: Physical Therapy

## 2016-10-25 ENCOUNTER — Encounter (INDEPENDENT_AMBULATORY_CARE_PROVIDER_SITE_OTHER): Payer: Self-pay | Admitting: Orthopaedic Surgery

## 2016-10-25 ENCOUNTER — Ambulatory Visit (INDEPENDENT_AMBULATORY_CARE_PROVIDER_SITE_OTHER): Payer: Medicare HMO | Admitting: Orthopaedic Surgery

## 2016-10-25 VITALS — BP 157/119 | HR 74

## 2016-10-25 DIAGNOSIS — Z9889 Other specified postprocedural states: Secondary | ICD-10-CM

## 2016-10-25 NOTE — Progress Notes (Signed)
Post-Op Visit Note   Patient: Kyle Peterson           Date of Birth: 10/12/1965           MRN: [id number] Visit Date: 10/25/2016 PCP: Carney Living, MD   Assessment & Plan: Follow-up left shoulder arthroscopy and open rotator cuff repair supraspinatus tendon. He is making excellent progress with physical therapy's happy with the surgical result. Continue therapy and recheck her final visit in one month  Chief Complaint:  Chief Complaint  Patient presents with  . Left Shoulder - Follow-up, Routine Post Op   Visit Diagnoses:  1. S/P left rotator cuff repair     Plan: Recheck 1 month continue therapy.  Follow-Up Instructions: No Follow-up on file.   Orders:  No orders of the defined types were placed in this encounter.  No orders of the defined types were placed in this encounter.   Imaging: No results found.  PMFS History: Patient Active Problem List   Diagnosis Date Noted  . Severe obesity (BMI >= 40) (HCC) 09/27/2016  . Dilated aortic root (HCC) 07/20/2016  . Claudication (HCC) 07/20/2016  . Heart murmur 07/20/2016  . Trigger finger, left middle finger 06/21/2016  . Diabetes (HCC) 05/24/2016  . Skin induration 02/19/2016  . Depression, major, recurrent, moderate (HCC) 11/05/2015  . Chronic post-traumatic stress disorder (PTSD) 11/05/2015    Class: Chronic  . OSA (obstructive sleep apnea)   . Persistent atrial fibrillation (HCC) 08/19/2015  . CAD (coronary artery disease), native coronary artery 08/19/2015  . Shoulder pain 06/11/2015  . Hyperlipidemia 06/10/2015  . Tobacco use disorder 05/28/2015  . Essential hypertension 05/27/2015  . Bipolar disorder (HCC) 05/27/2015  . 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 Obstructive sleep apnea 05/27/2015   Past Medical History:  Diagnosis Date  . AICD (automatic cardioverter/defibrillator) present    bostan scien  . Anginal pain (HCC)    ?heart pain   per dr Estill Dooms  . Anxiety   . Arthritis    ddd    . CAD (coronary artery disease), native coronary artery 08/19/2015  . CHF (congestive heart failure) (HCC)   . Depression   . Diabetes mellitus without complication (HCC)    no meds  new dx  . Heart murmur   . High cholesterol   . Hypertension   . Myocardial infarction (HCC)   . OSA (obstructive sleep apnea)    Severe with AHI 80/hr- new bipap  . PAF (paroxysmal atrial fibrillation) (HCC) 08/19/2015  . PTSD (post-traumatic stress disorder)   . Stroke (HCC) 2016  . Substance abuse     Family History  Problem Relation Age of Onset  . Diabetes Mother   . Hypertension Mother   . Diabetes Father   . Hypertension Father   . Heart disease Father   . Alcohol abuse Father   . Alcohol abuse Brother   . Drug abuse Brother     Past Surgical History:  Procedure Laterality Date  . CARDIAC CATHETERIZATION     01/13/14 [facility]: LM NL, mild narrowing of proximal/mid LAD, mid LCX, proximal RCA. 30% ostial OM1. 40% distal RCA. Medical tx.   . CARPAL TUNNEL RELEASE Left 11/02/2015  . CERVICAL DISCECTOMY    . MYOMECTOMY    . SHOULDER ARTHROSCOPY WITH ROTATOR CUFF REPAIR Left 09/09/2016   Procedure: LEFT SHOULDER ARTHROSCOPY WITH MINI OPEN ROTATOR CUFF REPAIR;  Surgeon: Eldred Manges, MD;  Location: [facility] OR;  Service: Orthopedics;  Laterality: Left;  . TONSILLECTOMY  Social History   Occupational History  . Not on file.   Social History Main Topics  . Smoking status: Current Every Day Smoker    Packs/day: 0.50    Years: 40.00    Types: Cigarettes  . Smokeless tobacco: Never Used     Comment: Trying to cut back   . Alcohol use No  . Drug use: Yes    Frequency: 3.0 times per week    Types: Marijuana     Comment: last 07/26/16  . Sexual activity: Not Currently

## 2016-10-26 ENCOUNTER — Ambulatory Visit: Payer: Medicare HMO | Admitting: Physical Therapy

## 2016-10-26 ENCOUNTER — Encounter: Payer: Self-pay | Admitting: Physical Therapy

## 2016-10-26 DIAGNOSIS — M6281 Muscle weakness (generalized): Secondary | ICD-10-CM | POA: Diagnosis not present

## 2016-10-26 DIAGNOSIS — M25612 Stiffness of left shoulder, not elsewhere classified: Secondary | ICD-10-CM | POA: Diagnosis not present

## 2016-10-26 DIAGNOSIS — M25512 Pain in left shoulder: Secondary | ICD-10-CM

## 2016-10-26 NOTE — Therapy (Signed)
[facility] Outpatient Rehabilitation [facility] [address] [place], [place], [address] Phone: [phone]   Fax:  [phone]  Physical Therapy Treatment  Patient Details  Name: Kyle Peterson MRN: [id number] Date of Birth: 03-20-1965 Referring Provider: Annell Greening MD  Encounter Date: 10/26/2016      PT End of Session - 10/26/16 1414    Visit Number 9   Number of Visits 17   Date for PT Re-Evaluation 11/18/16   Authorization Type MCR: Kx mod by 15th visit, progress note by 19th visit   PT Start Time 1330   PT Stop Time 1413   PT Time Calculation (min) 43 min   Activity Tolerance Patient tolerated treatment well   Behavior During Therapy [facility] for tasks assessed/performed      Past Medical History:  Diagnosis Date  . AICD (automatic cardioverter/defibrillator) present    bostan scien  . Anginal pain (HCC)    ?heart pain   per dr Estill Dooms  . Anxiety   . Arthritis    ddd  . CAD (coronary artery disease), native coronary artery 08/19/2015  . CHF (congestive heart failure) (HCC)   . Depression   . Diabetes mellitus without complication (HCC)    no meds  new dx  . Heart murmur   . High cholesterol   . Hypertension   . Myocardial infarction (HCC)   . OSA (obstructive sleep apnea)    Severe with AHI 80/hr- new bipap  . PAF (paroxysmal atrial fibrillation) (HCC) 08/19/2015  . PTSD (post-traumatic stress disorder)   . Stroke (HCC) 2016  . Substance abuse     Past Surgical History:  Procedure Laterality Date  . CARDIAC CATHETERIZATION     01/13/14 [facility]: LM NL, mild narrowing of proximal/mid LAD, mid LCX, proximal RCA. 30% ostial OM1. 40% distal RCA. Medical tx.   . CARPAL TUNNEL RELEASE Left 11/02/2015  . CERVICAL DISCECTOMY    . MYOMECTOMY    . SHOULDER ARTHROSCOPY WITH ROTATOR CUFF REPAIR Left 09/09/2016   Procedure: LEFT SHOULDER ARTHROSCOPY WITH MINI OPEN ROTATOR CUFF REPAIR;  Surgeon: Eldred Manges, MD;  Location: [facility] OR;  Service: Orthopedics;   Laterality: Left;  . TONSILLECTOMY      There were no vitals filed for this visit.      Subjective Assessment - 10/26/16 1332    Subjective the shoulder is doing good, I saw the MD and he reported I am doing well   Currently in Pain? No/denies            [facility] PT Assessment - 10/26/16 1400      PROM   Left Shoulder Extension 55 Degrees   Left Shoulder Flexion 145 Degrees   Left Shoulder ABduction 134 Degrees   Left Shoulder Internal Rotation 65 Degrees   Left Shoulder External Rotation 78 Degrees  90/90                     [facility] Adult PT Treatment/Exercise - 10/26/16 1335      Shoulder Exercises: Standing   External Rotation AROM;10 reps;Theraband;Left;Strengthening   Theraband Level (Shoulder External Rotation) Level 3 (Green)   Internal Rotation Strengthening;Left;10 reps;Theraband   Theraband Level (Shoulder Internal Rotation) Level 3 (Green)   Extension 20 reps;Theraband   Theraband Level (Shoulder Extension) Level 3 (Green)   Row 20 reps;Theraband   Theraband Level (Shoulder Row) Level 4 (Blue)   Other Standing Exercises wall wash 2 x 10/ 2 x 10 CCW  cues to keep elbow  extended   Other Standing Exercises Bicep curl 2 x 12  10#     Shoulder Exercises: ROM/Strengthening   UBE (Upper Arm Bike) L1 x 6 min  changing direction 3 min     Shoulder Exercises: Stretch   Corner Stretch 2 reps;30 seconds   Other Shoulder Stretches sleeper stretch 2 x 30   Other Shoulder Stretches bicep 3-way stretch                   PT Short Term Goals - 10/18/16 0810      PT SHORT TERM GOAL #1   Title pt to be I with initial HEP   Time 4   Period Weeks   Status Achieved     PT SHORT TERM GOAL #2   Title pt to increase L shoulder PROM/AAROM to >/= 90 degrees flexion/ abduction and 45 degrees of ER/ IR to promote shoulder mobility with </= 5/10 pain   Time 4   Period Weeks   Status Achieved     PT SHORT TERM GOAL #3   Title pt will increase L grip  strength by >/= 5# to demo functional improvement in shoulder   Time 4   Period Weeks   Status On-going           PT Long Term Goals - 09/23/16 1332      PT LONG TERM GOAL #1   Title pt to increase L shoulder flexion/ abduction to >/= 120 degrees and IR/ER to >/=60 degrees with </= 1/10 pain for functional mobility required for ADLS   Time 8   Period Weeks   Status New   Target Date 11/18/16     PT LONG TERM GOAL #2   Title pt to increase L shoulder to >/= 4/5 in all planes to provide stability and assist with lifting and carrying activities    Time 8   Period Weeks   Status New   Target Date 11/18/16     PT LONG TERM GOAL #3   Title pt to be able to lift and lower >/= 8# to and from and over head shelf and push/pull >/= 15# with </= 1/10 pain for functional strength required for ADLs    Time 8   Period Weeks   Status New   Target Date 11/18/16     PT LONG TERM GOAL #4   Title increase FOTO score to </=30% to demo improvement in function    Time 8   Period Weeks   Status New   Target Date 11/18/16     PT LONG TERM GOAL #5   Title pt to be I with all HEp given as of last visit    Time 8   Period Weeks   Status New   Target Date 11/18/16               Plan - 10/26/16 1414    Clinical Impression Statement pt continus to demonstrate improvement in shoulder mobility and he reports seeing his MD stating he is pleased with his current level of ROM. progressed to UBE and continued AROM and strengthening activitys which he performed well fatiguing quickly with wall washes. post session he declined modalities and reports no pain today.    PT Next Visit Plan update HEP PRN.  scapular stability, bicep / tricep strengthneing, beging gym specific exercise continue to observe protocol for RCR.    PT Home Exercise Plan scapular retraction, table slides flexion/ abduction, Isometric strengthening IR/ER/  flexion/ Extension, wand flexion/ abduction in standing   Consulted and  Agree with Plan of Care Patient      Patient will benefit from skilled therapeutic intervention in order to improve the following deficits and impairments:  Pain, Improper body mechanics, Postural dysfunction, Decreased strength, Decreased mobility, Decreased range of motion, Decreased activity tolerance, Decreased endurance, Increased fascial restricitons, Increased muscle spasms, Obesity, Decreased safety awareness  Visit Diagnosis: Acute pain of left shoulder  Stiffness of left shoulder, not elsewhere classified  Muscle weakness (generalized)       G-Codes - 11/23/16 1418    Functional Assessment Tool Used (Outpatient Only) ROM, MMT, FOTO   Functional Limitation Carrying, moving and handling objects   Carrying, Moving and Handling Objects Current Status (O1308) At least 20 percent but less than 40 percent impaired, limited or restricted   Carrying, Moving and Handling Objects Goal Status (M5784) At least 20 percent but less than 40 percent impaired, limited or restricted      Problem List Patient Active Problem List   Diagnosis Date Noted  . S/P left rotator cuff repair 10/25/2016  . Severe obesity (BMI >= 40) (HCC) 09/27/2016  . Dilated aortic root (HCC) 07/20/2016  . Claudication (HCC) 07/20/2016  . Heart murmur 07/20/2016  . Trigger finger, left middle finger 06/21/2016  . Diabetes (HCC) 05/24/2016  . Skin induration 02/19/2016  . Depression, major, recurrent, moderate (HCC) 11/05/2015  . Chronic post-traumatic stress disorder (PTSD) 11/05/2015    Class: Chronic  . OSA (obstructive sleep apnea)   . Persistent atrial fibrillation (HCC) 08/19/2015  . CAD (coronary artery disease), native coronary artery 08/19/2015  . Shoulder pain 06/11/2015  . Hyperlipidemia 06/10/2015  . Tobacco use disorder 05/28/2015  . Essential hypertension 05/27/2015  . Bipolar disorder (HCC) 05/27/2015  . 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 Obstructive sleep apnea 05/27/2015    Lulu Riding PT, DPT, LAT, ATC  2016-11-23  2:19 PM      [facility] [address] [place], [place], [address] Phone: [phone]   Fax:  [phone]  Name: Kyle Peterson MRN: [id number] Date of Birth: [birth year]

## 2016-11-02 ENCOUNTER — Telehealth (INDEPENDENT_AMBULATORY_CARE_PROVIDER_SITE_OTHER): Payer: Self-pay | Admitting: Orthopaedic Surgery

## 2016-11-02 ENCOUNTER — Ambulatory Visit: Payer: Medicare HMO | Attending: Family Medicine | Admitting: Physical Therapy

## 2016-11-02 ENCOUNTER — Encounter: Payer: Self-pay | Admitting: Physical Therapy

## 2016-11-02 DIAGNOSIS — M6281 Muscle weakness (generalized): Secondary | ICD-10-CM | POA: Diagnosis not present

## 2016-11-02 DIAGNOSIS — M25612 Stiffness of left shoulder, not elsewhere classified: Secondary | ICD-10-CM | POA: Diagnosis not present

## 2016-11-02 DIAGNOSIS — M25512 Pain in left shoulder: Secondary | ICD-10-CM | POA: Insufficient documentation

## 2016-11-02 NOTE — Telephone Encounter (Signed)
Please advise. Would you like for me to bring patient in on Friday?

## 2016-11-02 NOTE — Telephone Encounter (Signed)
Ok to give it a couple weeks and see.

## 2016-11-02 NOTE — Telephone Encounter (Signed)
I spoke with patient. 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 Started to have some pain last night and had some pain with therapy today. He would like to wait and see if he just aggrivated his shoulder some. He will call for work in appointment if pain continues to get worse.

## 2016-11-02 NOTE — Telephone Encounter (Signed)
Patient states he was helping his dad lift packages and today he is at physical therapy and hurting more than normal. The therapist wanted to call office to see if any appointment was needed or if there was something that could be done without an appointment.

## 2016-11-02 NOTE — Therapy (Signed)
[facility] Outpatient Rehabilitation [facility] [address] [place], [place], [address] Phone: [phone]   Fax:  [phone]  Physical Therapy Treatment  Patient Details  Name: Kyle Peterson MRN: [id number] Date of Birth: 08-Apr-1965 Referring Provider: Annell Greening MD  Encounter Date: 11/02/2016      PT End of Session - 11/02/16 1050    Visit Number 10   Number of Visits 17   Date for PT Re-Evaluation 11/18/16   Authorization Type MCR: Kx mod by 15th visit, progress note by 19th visit   PT Start Time 1015   PT Stop Time 1100   PT Time Calculation (min) 45 min   Activity Tolerance Patient tolerated treatment well   Behavior During Therapy [facility] for tasks assessed/performed      Past Medical History:  Diagnosis Date  . AICD (automatic cardioverter/defibrillator) present    bostan scien  . Anginal pain (HCC)    ?heart pain   per dr Estill Dooms  . Anxiety   . Arthritis    ddd  . CAD (coronary artery disease), native coronary artery 08/19/2015  . CHF (congestive heart failure) (HCC)   . Depression   . Diabetes mellitus without complication (HCC)    no meds  new dx  . Heart murmur   . High cholesterol   . Hypertension   . Myocardial infarction (HCC)   . OSA (obstructive sleep apnea)    Severe with AHI 80/hr- new bipap  . PAF (paroxysmal atrial fibrillation) (HCC) 08/19/2015  . PTSD (post-traumatic stress disorder)   . Stroke (HCC) 2016  . Substance abuse [facility])     Past Surgical History:  Procedure Laterality Date  . CARDIAC CATHETERIZATION     01/13/14 [facility]: LM NL, mild narrowing of proximal/mid LAD, mid LCX, proximal RCA. 30% ostial OM1. 40% distal RCA. Medical tx.   . CARPAL TUNNEL RELEASE Left 11/02/2015  . CERVICAL DISCECTOMY    . MYOMECTOMY    . SHOULDER ARTHROSCOPY WITH ROTATOR CUFF REPAIR Left 09/09/2016   Procedure: LEFT SHOULDER ARTHROSCOPY WITH MINI OPEN ROTATOR CUFF REPAIR;  Surgeon: Eldred Manges, MD;  Location: [facility] OR;  Service:  Orthopedics;  Laterality: Left;  . TONSILLECTOMY      There were no vitals filed for this visit.      Subjective Assessment - 11/02/16 1022    Subjective "I am feeling more soreness today. 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 Yes   Pain Score 4    Pain Location Shoulder   Pain Orientation Left   Pain Descriptors / Indicators Sore;Aching   Pain Type Surgical pain   Pain Onset More than a month ago   Pain Frequency Intermittent   Aggravating Factors  lifting moving    Pain Relieving Factors resting                         [facility] Adult PT Treatment/Exercise - 11/02/16 1025      Shoulder Exercises: Standing   Extension 20 reps;Theraband   Theraband Level (Shoulder Extension) Level 3 (Green)   Row 20 reps;Theraband   Theraband Level (Shoulder Row) Level 3 (Green)     Shoulder Exercises: Pulleys   Flexion 2 minutes   ABduction 2 minutes     Modalities   Modalities Cryotherapy  Cryotherapy   Number Minutes Cryotherapy 10 Minutes   Cryotherapy Location Shoulder   Type of Cryotherapy Ice pack     Manual Therapy   Manual therapy comments manual trigger point release over the L pec minor, middle deltoid   Soft tissue mobilization IASTM over the deltoid anterior/ middle portions                PT Education - 11/02/16 1058    Education provided Yes   Education Details discussed findings of todays assessment and benefits of getting back in with his MD.    [place]) Educated Patient   Methods Explanation;Verbal cues   Comprehension Verbalized understanding;Verbal cues required          PT Short Term Goals - 10/18/16 0810      PT SHORT TERM GOAL #1   Title pt to be I with initial HEP   Time 4   Period Weeks   Status Achieved     PT SHORT TERM GOAL #2   Title pt to increase L shoulder PROM/AAROM to >/= 90  degrees flexion/ abduction and 45 degrees of ER/ IR to promote shoulder mobility with </= 5/10 pain   Time 4   Period Weeks   Status Achieved     PT SHORT TERM GOAL #3   Title pt will increase L grip strength by >/= 5# to demo functional improvement in shoulder   Time 4   Period Weeks   Status On-going           PT Long Term Goals - 09/23/16 1332      PT LONG TERM GOAL #1   Title pt to increase L shoulder flexion/ abduction to >/= 120 degrees and IR/ER to >/=60 degrees with </= 1/10 pain for functional mobility required for ADLS   Time 8   Period Weeks   Status New   Target Date 11/18/16     PT LONG TERM GOAL #2   Title pt to increase L shoulder to >/= 4/5 in all planes to provide stability and assist with lifting and carrying activities    Time 8   Period Weeks   Status New   Target Date 11/18/16     PT LONG TERM GOAL #3   Title pt to be able to lift and lower >/= 8# to and from and over head shelf and push/pull >/= 15# with </= 1/10 pain for functional strength required for ADLs    Time 8   Period Weeks   Status New   Target Date 11/18/16     PT LONG TERM GOAL #4   Title increase FOTO score to </=30% to demo improvement in function    Time 8   Period Weeks   Status New   Target Date 11/18/16     PT LONG TERM GOAL #5   Title pt to be I with all HEp given as of last visit    Time 8   Period Weeks   Status New   Target Date 11/18/16               Plan - 11/02/16 1050    Clinical Impression Statement pt reports having more pain and soreness in the L shoulder after lifting an box with his dad over the weekend and has been having increase pain and soreness. positive testing with obrians, and increaesd pain over the deltoid and supraspinatus.  based on todays assessment dicussed with pt that due to new symptoms of  pain and weakness. focused todays session on scapular stability, avoided RC strengthening due to pain. pt called his MD during session while icing  and set up an appointment with his MD.    PT Next Visit Plan did pt see his MD, strengthening for scapular stability, tricep/bicep strengthening, RCR protocol    PT Home Exercise Plan scapular retraction, table slides flexion/ abduction, Isometric strengthening IR/ER/ flexion/ Extension, wand flexion/ abduction in standing   Consulted and Agree with Plan of Care Patient      Patient will benefit from skilled therapeutic intervention in order to improve the following deficits and impairments:  Pain, Improper body mechanics, Postural dysfunction, Decreased strength, Decreased mobility, Decreased range of motion, Decreased activity tolerance, Decreased endurance, Increased fascial restricitons, Increased muscle spasms, Obesity, Decreased safety awareness  Visit Diagnosis: Acute pain of left shoulder  Stiffness of left shoulder, not elsewhere classified  Muscle weakness (generalized)     Problem List Patient Active Problem List   Diagnosis Date Noted  . S/P left rotator cuff repair 10/25/2016  . Severe obesity (BMI >= 40) (HCC) 09/27/2016  . Dilated aortic root (HCC) 07/20/2016  . Claudication (HCC) 07/20/2016  . Heart murmur 07/20/2016  . Trigger finger, left middle finger 06/21/2016  . Diabetes (HCC) 05/24/2016  . Skin induration 02/19/2016  . Depression, major, recurrent, moderate (HCC) 11/05/2015  . Chronic post-traumatic stress disorder (PTSD) 11/05/2015    Class: Chronic  . OSA (obstructive sleep apnea)   . Persistent atrial fibrillation (HCC) 08/19/2015  . CAD (coronary artery disease), native coronary artery 08/19/2015  . Shoulder pain 06/11/2015  . Hyperlipidemia 06/10/2015  . Tobacco use disorder 05/28/2015  . Essential hypertension 05/27/2015  . Bipolar disorder (HCC) 05/27/2015  . 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 Obstructive sleep apnea 05/27/2015   Lulu Riding PT, DPT, LAT, ATC  11/02/16  11:01 AM      [facility] Outpatient  Rehabilitation [facility] [address] [place], [place], [address] Phone: [phone]   Fax:  [phone]  Name: Kyle Peterson MRN: [id number] Date of Birth: 1965-08-10

## 2016-11-09 ENCOUNTER — Encounter: Payer: Self-pay | Admitting: Physical Therapy

## 2016-11-09 ENCOUNTER — Ambulatory Visit: Payer: Medicare HMO | Admitting: Physical Therapy

## 2016-11-09 DIAGNOSIS — M25512 Pain in left shoulder: Secondary | ICD-10-CM | POA: Diagnosis not present

## 2016-11-09 DIAGNOSIS — M6281 Muscle weakness (generalized): Secondary | ICD-10-CM

## 2016-11-09 DIAGNOSIS — M25612 Stiffness of left shoulder, not elsewhere classified: Secondary | ICD-10-CM | POA: Diagnosis not present

## 2016-11-10 NOTE — Therapy (Signed)
[facility] Outpatient Rehabilitation [facility] [address] [place], [place], [address] Phone: [phone]   Fax:  [phone]  Physical Therapy Treatment  Patient Details  Name: Norrin Shreffler MRN: [id number] Date of Birth: 04-Sep-1965 Referring Provider: Annell Greening MD  Encounter Date: 11/09/2016      PT End of Session - 11/09/16 1528    Visit Number 11   Number of Visits 17   Date for PT Re-Evaluation 11/18/16   Authorization Type MCR: Kx mod by 15th visit, progress note by 19th visit   PT Start Time 1100   PT Stop Time 1154   PT Time Calculation (min) 54 min   Activity Tolerance Patient tolerated treatment well   Behavior During Therapy [facility] for tasks assessed/performed      Past Medical History:  Diagnosis Date  . AICD (automatic cardioverter/defibrillator) present    bostan scien  . Anginal pain (HCC)    ?heart pain   per dr Estill Dooms  . Anxiety   . Arthritis    ddd  . CAD (coronary artery disease), native coronary artery 08/19/2015  . CHF (congestive heart failure) (HCC)   . Depression   . Diabetes mellitus without complication (HCC)    no meds  new dx  . Heart murmur   . High cholesterol   . Hypertension   . Myocardial infarction (HCC)   . OSA (obstructive sleep apnea)    Severe with AHI 80/hr- new bipap  . PAF (paroxysmal atrial fibrillation) (HCC) 08/19/2015  . PTSD (post-traumatic stress disorder)   . Stroke (HCC) 2016  . Substance abuse [facility])     Past Surgical History:  Procedure Laterality Date  . CARDIAC CATHETERIZATION     01/13/14 [facility]: LM NL, mild narrowing of proximal/mid LAD, mid LCX, proximal RCA. 30% ostial OM1. 40% distal RCA. Medical tx.   . CARPAL TUNNEL RELEASE Left 11/02/2015  . CERVICAL DISCECTOMY    . MYOMECTOMY    . SHOULDER ARTHROSCOPY WITH ROTATOR CUFF REPAIR Left 09/09/2016   Procedure: LEFT SHOULDER ARTHROSCOPY WITH MINI OPEN ROTATOR CUFF REPAIR;  Surgeon: Eldred Manges, MD;  Location: [facility] OR;  Service:  Orthopedics;  Laterality: Left;  . TONSILLECTOMY      There were no vitals filed for this visit.      Subjective Assessment - 11/09/16 1516    Subjective Patient continues to rewport pain in his anterior shoulder around his bicpes groove. The pain has not changed since he lifted the object a few weeks ago. It is more anterior today.    Limitations Lifting;House hold activities   How long can you sit comfortably? from back pain 45 - 60 min   How long can you stand comfortably? 15 win   How long can you walk comfortably? 30 min   Diagnostic tests X-ray   Patient Stated Goals get motion back, get strength back, be able to lift weights and return to the gym.    Currently in Pain? Yes   Pain Score 4    Pain Location Shoulder   Pain Orientation Left   Pain Descriptors / Indicators Sore;Aching   Pain Type Surgical pain   Pain Onset More than a month ago   Pain Frequency Intermittent   Aggravating Factors  lifting and moving    Pain Relieving Factors resting                          [facility] Adult PT Treatment/Exercise - 11/10/16 0001  Shoulder Exercises: Standing   Internal Rotation Limitations perfromed with red but felt pain in subscap area   Extension 20 reps;Theraband   Theraband Level (Shoulder Extension) Level 3 (Green)   Row 20 reps;Theraband   Theraband Level (Shoulder Row) Level 3 (Green)     Shoulder Exercises: Lawyer 2 reps;30 seconds   Corner Stretch Limitations cuing to not over stretch      Modalities   Modalities Cryotherapy     Cryotherapy   Number Minutes Cryotherapy 10 Minutes   Cryotherapy Location Shoulder   Type of Cryotherapy Ice pack     Manual Therapy   Soft tissue mobilization IASTM over the deltoid anterior/ middle portions; bicpes and subscap area                 PT Education - 11/09/16 1527    Education provided Yes   Education Details reviewed improtance of rest and ice to reduce acute  inflammation    Person(s) Educated Patient   Methods Explanation;Demonstration   Comprehension Verbalized understanding;Returned demonstration;Tactile cues required;Verbal cues required          PT Short Term Goals - 11/10/16 0949      PT SHORT TERM GOAL #1   Title pt to be I with initial HEP   Baseline perfroming at home    Time 4   Period Weeks   Status Achieved     PT SHORT TERM GOAL #2   Title pt to increase L shoulder PROM/AAROM to >/= 90 degrees flexion/ abduction and 45 degrees of ER/ IR to promote shoulder mobility with </= 5/10 pain   Time 4   Period Weeks   Status Achieved     PT SHORT TERM GOAL #3   Title pt will increase L grip strength by >/= 5# to demo functional improvement in shoulder   Time 4   Period Weeks   Status On-going           PT Long Term Goals - 09/23/16 1332      PT LONG TERM GOAL #1   Title pt to increase L shoulder flexion/ abduction to >/= 120 degrees and IR/ER to >/=60 degrees with </= 1/10 pain for functional mobility required for ADLS   Time 8   Period Weeks   Status New   Target Date 11/18/16     PT LONG TERM GOAL #2   Title pt to increase L shoulder to >/= 4/5 in all planes to provide stability and assist with lifting and carrying activities    Time 8   Period Weeks   Status New   Target Date 11/18/16     PT LONG TERM GOAL #3   Title pt to be able to lift and lower >/= 8# to and from and over head shelf and push/pull >/= 15# with </= 1/10 pain for functional strength required for ADLs    Time 8   Period Weeks   Status New   Target Date 11/18/16     PT LONG TERM GOAL #4   Title increase FOTO score to </=30% to demo improvement in function    Time 8   Period Weeks   Status New   Target Date 11/18/16     PT LONG TERM GOAL #5   Title pt to be I with all HEp given as of last visit    Time 8   Period Weeks   Status New   Target Date 11/18/16  Plan - 11/09/16 1530    Clinical Impression  Statement Therapy focused on manual therapy to the bicpes tendon and the posterior shoulder. He had significnt muscle tightness around the sub scap area. He had pain with internal rotation but tolerated his other exercises well. He was advised to take it easy over the weekend and see if the pain improves. Patient requested to not lie down on his back.    Clinical Presentation Stable   Clinical Decision Making Low   PT Frequency 2x / week   PT Duration 8 weeks   PT Treatment/Interventions ADLs/Self Care Home Management;Cryotherapy;Electrical Stimulation;Iontophoresis /ml Dexamethasone;Moist Heat;Ultrasound;Therapeutic activities;Therapeutic exercise;Taping;Manual techniques;Passive range of motion;Patient/family education;Neuromuscular re-education;Vasopneumatic Device   PT Next Visit Plan did pt see his MD, strengthening for scapular stability, tricep/bicep strengthening, RCR protocol    PT Home Exercise Plan scapular retraction, table slides flexion/ abduction, Isometric strengthening IR/ER/ flexion/ Extension, wand flexion/ abduction in standing   Consulted and Agree with Plan of Care Patient      Patient will benefit from skilled therapeutic intervention in order to improve the following deficits and impairments:  Pain, Improper body mechanics, Postural dysfunction, Decreased strength, Decreased mobility, Decreased range of motion, Decreased activity tolerance, Decreased endurance, Increased fascial restricitons, Increased muscle spasms, Obesity, Decreased safety awareness  Visit Diagnosis: Acute pain of left shoulder  Stiffness of left shoulder, not elsewhere classified  Muscle weakness (generalized)     Problem List Patient Active Problem List   Diagnosis Date Noted  . S/P left rotator cuff repair 10/25/2016  . Severe obesity (BMI >= 40) (HCC) 09/27/2016  . Dilated aortic root (HCC) 07/20/2016  . Claudication (HCC) 07/20/2016  . Heart murmur 07/20/2016  . Trigger finger, left  middle finger 06/21/2016  . Diabetes (HCC) 05/24/2016  . Skin induration 02/19/2016  . Depression, major, recurrent, moderate (HCC) 11/05/2015  . Chronic post-traumatic stress disorder (PTSD) 11/05/2015    Class: Chronic  . OSA (obstructive sleep apnea)   . Persistent atrial fibrillation (HCC) 08/19/2015  . CAD (coronary artery disease), native coronary artery 08/19/2015  . Shoulder pain 06/11/2015  . Hyperlipidemia 06/10/2015  . Tobacco use disorder 05/28/2015  . Essential hypertension 05/27/2015  . Bipolar disorder (HCC) 05/27/2015  . 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 Obstructive sleep apnea 05/27/2015    Dessie Coma PT DPT  11/10/2016, 9:54 AM  [facility] [address] [place], [place], [address] Phone: [phone]   Fax:  [phone]  Name: Sherwin Hollingshed MRN: [id number] Date of Birth: 01/30/1966

## 2016-11-16 ENCOUNTER — Encounter: Payer: Self-pay | Admitting: Physical Therapy

## 2016-11-16 ENCOUNTER — Ambulatory Visit: Payer: Medicare HMO | Admitting: Physical Therapy

## 2016-11-16 DIAGNOSIS — M25612 Stiffness of left shoulder, not elsewhere classified: Secondary | ICD-10-CM

## 2016-11-16 DIAGNOSIS — M6281 Muscle weakness (generalized): Secondary | ICD-10-CM

## 2016-11-16 DIAGNOSIS — M25512 Pain in left shoulder: Secondary | ICD-10-CM | POA: Diagnosis not present

## 2016-11-16 NOTE — Therapy (Signed)
[facility] Outpatient Rehabilitation [facility] [address] [place], [place], [address] Phone: [phone]   Fax:  [phone]  Physical Therapy Treatment  Patient Details  Name: Kyle Peterson MRN: [id number] Date of Birth: November 20, 1965 Referring Provider: Annell Greening MD  Encounter Date: 11/16/2016      PT End of Session - 11/16/16 1101    Visit Number 12   Number of Visits 17   Date for PT Re-Evaluation 11/18/16   Authorization Type MCR: Kx mod by 15th visit, progress note by 19th visit   PT Start Time 1015   PT Stop Time 1101   PT Time Calculation (min) 46 min   Activity Tolerance Patient tolerated treatment well   Behavior During Therapy [facility] for tasks assessed/performed      Past Medical History:  Diagnosis Date  . AICD (automatic cardioverter/defibrillator) present    bostan scien  . Anginal pain (HCC)    ?heart pain   per dr Estill Dooms  . Anxiety   . Arthritis    ddd  . CAD (coronary artery disease), native coronary artery 08/19/2015  . CHF (congestive heart failure) (HCC)   . Depression   . Diabetes mellitus without complication (HCC)    no meds  new dx  . Heart murmur   . High cholesterol   . Hypertension   . Myocardial infarction (HCC)   . OSA (obstructive sleep apnea)    Severe with AHI 80/hr- new bipap  . PAF (paroxysmal atrial fibrillation) (HCC) 08/19/2015  . PTSD (post-traumatic stress disorder)   . Stroke (HCC) 2016  . Substance abuse [facility])     Past Surgical History:  Procedure Laterality Date  . CARDIAC CATHETERIZATION     01/13/14 [facility]: LM NL, mild narrowing of proximal/mid LAD, mid LCX, proximal RCA. 30% ostial OM1. 40% distal RCA. Medical tx.   . CARPAL TUNNEL RELEASE Left 11/02/2015  . CERVICAL DISCECTOMY    . MYOMECTOMY    . SHOULDER ARTHROSCOPY WITH ROTATOR CUFF REPAIR Left 09/09/2016   Procedure: LEFT SHOULDER ARTHROSCOPY WITH MINI OPEN ROTATOR CUFF REPAIR;  Surgeon: Eldred Manges, MD;  Location: [facility] OR;  Service:  Orthopedics;  Laterality: Left;  . TONSILLECTOMY      There were no vitals filed for this visit.      Subjective Assessment - 11/16/16 1015    Subjective "I think it is getting better, I still have some soreness with reaching overhead"    Currently in Pain? Yes   Pain Score 2    Pain Location Shoulder   Pain Orientation Left   Pain Descriptors / Indicators Aching;Sore   Pain Type Surgical pain   Aggravating Factors  overhead lifting   Pain Relieving Factors resting                         [facility] Adult PT Treatment/Exercise - 11/16/16 1020      Shoulder Exercises: Standing   Protraction 15 reps;Theraband;Both  2 sets   Other Standing Exercises Tricep press 2 x 12 with palm up/down   Other Standing Exercises Bicep curl 2 x 12,      Shoulder Exercises: ROM/Strengthening   UBE (Upper Arm Bike) L1 x 6 min  changing direction at 3 min   Other ROM/Strengthening Exercises wall slides flexion 2 x 10, abduction 2 x 10     Shoulder Exercises: Stretch   Corner Stretch 2 reps;30 seconds   Other Shoulder Stretches Levator scapulae stretch 2 x 30  Manual Therapy   Manual therapy comments manual trigger point release over the L pec minor, middle deltoid, and mid bicep brachii   Soft tissue mobilization IASTM over the deltoid anterior/ middle portions; bicpes and subscap area    Scapular Mobilization upward rotation with shoulder action flexion /abduction 2 x 10 with wall slides                PT Education - 11/16/16 1101    Education provided Yes   Education Details reviewed exercise and benefits of bicep/ tricep strengthening.           PT Short Term Goals - 11/10/16 0949      PT SHORT TERM GOAL #1   Title pt to be I with initial HEP   Baseline perfroming at home    Time 4   Period Weeks   Status Achieved     PT SHORT TERM GOAL #2   Title pt to increase L shoulder PROM/AAROM to >/= 90 degrees flexion/ abduction and 45 degrees of ER/ IR to  promote shoulder mobility with </= 5/10 pain   Time 4   Period Weeks   Status Achieved     PT SHORT TERM GOAL #3   Title pt will increase L grip strength by >/= 5# to demo functional improvement in shoulder   Time 4   Period Weeks   Status On-going           PT Long Term Goals - 09/23/16 1332      PT LONG TERM GOAL #1   Title pt to increase L shoulder flexion/ abduction to >/= 120 degrees and IR/ER to >/=60 degrees with </= 1/10 pain for functional mobility required for ADLS   Time 8   Period Weeks   Status New   Target Date 11/18/16     PT LONG TERM GOAL #2   Title pt to increase L shoulder to >/= 4/5 in all planes to provide stability and assist with lifting and carrying activities    Time 8   Period Weeks   Status New   Target Date 11/18/16     PT LONG TERM GOAL #3   Title pt to be able to lift and lower >/= 8# to and from and over head shelf and push/pull >/= 15# with </= 1/10 pain for functional strength required for ADLs    Time 8   Period Weeks   Status New   Target Date 11/18/16     PT LONG TERM GOAL #4   Title increase FOTO score to </=30% to demo improvement in function    Time 8   Period Weeks   Status New   Target Date 11/18/16     PT LONG TERM GOAL #5   Title pt to be I with all HEp given as of last visit    Time 8   Period Weeks   Status New   Target Date 11/18/16               Plan - 11/16/16 1102    Clinical Impression Statement pt reports improvement of pain since previous session. continued soft tissue work and manual techniques to calm down soreness around the middle deltoid and proximal long head bicep tendon. continued shoulder strengthening and scapular stability strengthening. which he reported no increase in pain and declined modalities post session.    PT Next Visit Plan ERO, strengthening for scapular stability, tricep/bicep strengthening, RCR protocol , if pt continues to  progress discuss d/C    PT Home Exercise Plan scapular  retraction, table slides flexion/ abduction, Isometric strengthening IR/ER/ flexion/ Extension, wand flexion/ abduction in standing   Consulted and Agree with Plan of Care Patient      Patient will benefit from skilled therapeutic intervention in order to improve the following deficits and impairments:  Pain, Improper body mechanics, Postural dysfunction, Decreased strength, Decreased mobility, Decreased range of motion, Decreased activity tolerance, Decreased endurance, Increased fascial restricitons, Increased muscle spasms, Obesity, Decreased safety awareness  Visit Diagnosis: Acute pain of left shoulder  Stiffness of left shoulder, not elsewhere classified  Muscle weakness (generalized)     Problem List Patient Active Problem List   Diagnosis Date Noted  . S/P left rotator cuff repair 10/25/2016  . Severe obesity (BMI >= 40) (HCC) 09/27/2016  . Dilated aortic root (HCC) 07/20/2016  . Claudication (HCC) 07/20/2016  . Heart murmur 07/20/2016  . Trigger finger, left middle finger 06/21/2016  . Diabetes (HCC) 05/24/2016  . Skin induration 02/19/2016  . Depression, major, recurrent, moderate (HCC) 11/05/2015  . Chronic post-traumatic stress disorder (PTSD) 11/05/2015    Class: Chronic  . OSA (obstructive sleep apnea)   . Persistent atrial fibrillation (HCC) 08/19/2015  . CAD (coronary artery disease), native coronary artery 08/19/2015  . Shoulder pain 06/11/2015  . Hyperlipidemia 06/10/2015  . Tobacco use disorder 05/28/2015  . Essential hypertension 05/27/2015  . Bipolar disorder (HCC) 05/27/2015  . 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 Obstructive sleep apnea 05/27/2015   Lulu RidingKristoffer Bereket Gernert PT, DPT, LAT, ATC  11/16/16  11:07 AM      [facility] [address] [place], [place], [address] Phone: [phone]   Fax:  [phone]  Name: Kyle Peterson MRN: [id number] Date of Birth: 04-13-1965

## 2016-11-19 DIAGNOSIS — G4733 Obstructive sleep apnea (adult) (pediatric): Secondary | ICD-10-CM | POA: Diagnosis not present

## 2016-11-23 ENCOUNTER — Ambulatory Visit: Payer: Medicare HMO | Admitting: Physical Therapy

## 2016-11-23 DIAGNOSIS — M25512 Pain in left shoulder: Secondary | ICD-10-CM

## 2016-11-23 DIAGNOSIS — M25612 Stiffness of left shoulder, not elsewhere classified: Secondary | ICD-10-CM

## 2016-11-23 DIAGNOSIS — M6281 Muscle weakness (generalized): Secondary | ICD-10-CM

## 2016-11-24 NOTE — Therapy (Signed)
[facility] [address] [place], [place], [address] Phone: [phone]   Fax:  [phone]  Physical Therapy Treatment  Patient Details  Name: Kyle Peterson MRN: [id number] Date of Birth: 1965-12-04 Referring Provider: Annell Greening MD  Encounter Date: 11/23/2016      PT End of Session - 11/24/16 1046    Visit Number 13   Number of Visits 17   Date for PT Re-Evaluation 11/18/16   Authorization Type MCR: Kx mod by 15th visit, progress note by 19th visit   PT Start Time 1015   PT Stop Time 1056   PT Time Calculation (min) 41 min   Activity Tolerance Patient tolerated treatment well   Behavior During Therapy [facility] for tasks assessed/performed      Past Medical History:  Diagnosis Date  . AICD (automatic cardioverter/defibrillator) present    bostan scien  . Anginal pain (HCC)    ?heart pain   per dr Estill Dooms  . Anxiety   . Arthritis    ddd  . CAD (coronary artery disease), native coronary artery 08/19/2015  . CHF (congestive heart failure) (HCC)   . Depression   . Diabetes mellitus without complication (HCC)    no meds  new dx  . Heart murmur   . High cholesterol   . Hypertension   . Myocardial infarction (HCC)   . OSA (obstructive sleep apnea)    Severe with AHI 80/hr- new bipap  . PAF (paroxysmal atrial fibrillation) (HCC) 08/19/2015  . PTSD (post-traumatic stress disorder)   . Stroke (HCC) 2016  . Substance abuse [facility])     Past Surgical History:  Procedure Laterality Date  . CARDIAC CATHETERIZATION     01/13/14 [facility]: LM NL, mild narrowing of proximal/mid LAD, mid LCX, proximal RCA. 30% ostial OM1. 40% distal RCA. Medical tx.   . CARPAL TUNNEL RELEASE Left 11/02/2015  . CERVICAL DISCECTOMY    . MYOMECTOMY    . SHOULDER ARTHROSCOPY WITH ROTATOR CUFF REPAIR Left 09/09/2016   Procedure: LEFT SHOULDER ARTHROSCOPY WITH MINI OPEN ROTATOR CUFF REPAIR;  Surgeon: Eldred Manges, MD;  Location: [facility] OR;  Service:  Orthopedics;  Laterality: Left;  . TONSILLECTOMY      There were no vitals filed for this visit.      Subjective Assessment - 11/23/16 1022    Subjective Patient is having pain with overhead movmenet. When he reaches overhead he feels pressure. He feels like something is moving in his shoulder.    Currently in Pain? Yes   Pain Score 2    Pain Location Shoulder   Pain Orientation Left   Pain Descriptors / Indicators Aching   Pain Type Surgical pain   Pain Onset More than a month ago   Pain Frequency Intermittent   Aggravating Factors  overhead lifting    Pain Relieving Factors resting                          [facility] Adult PT Treatment/Exercise - 11/24/16 0001      Neck Exercises: Machines for Strengthening   UBE (Upper Arm Bike) 2 min forward 2 back    Cybex Row x20 each handle 20 lb    Other Machines for Strengthening lat pull down x20 25lbs      Shoulder Exercises: Research officer, trade [employer] Stretch 2 reps;30 seconds   Other Shoulder Stretches Levator scapulae stretch 2 x 30      Manual Therapy  Manual therapy comments manual trigger point release over the L pec minor, middle deltoid, and mid bicep brachii   Soft tissue mobilization IASTM over the deltoid anterior/ middle portions; bicpes and subscap area    Scapular Mobilization upward rotation with shoulder action flexion /abduction 2 x 10 with wall slides                PT Education - 11/24/16 1045    Education provided Yes   Education Details reviewed the anatomy of the shoulder    Person(s) Educated Patient   Methods Explanation;Demonstration   Comprehension Verbalized understanding;Returned demonstration;Verbal cues required;Tactile cues required          PT Short Term Goals - 11/10/16 0949      PT SHORT TERM GOAL #1   Title pt to be I with initial HEP   Baseline perfroming at home    Time 4   Period Weeks   Status Achieved     PT SHORT TERM GOAL #2   Title pt to increase L shoulder  PROM/AAROM to >/= 90 degrees flexion/ abduction and 45 degrees of ER/ IR to promote shoulder mobility with </= 5/10 pain   Time 4   Period Weeks   Status Achieved     PT SHORT TERM GOAL #3   Title pt will increase L grip strength by >/= 5# to demo functional improvement in shoulder   Time 4   Period Weeks   Status On-going           PT Long Term Goals - 09/23/16 1332      PT LONG TERM GOAL #1   Title pt to increase L shoulder flexion/ abduction to >/= 120 degrees and IR/ER to >/=60 degrees with </= 1/10 pain for functional mobility required for ADLS   Time 8   Period Weeks   Status New   Target Date 11/18/16     PT LONG TERM GOAL #2   Title pt to increase L shoulder to >/= 4/5 in all planes to provide stability and assist with lifting and carrying activities    Time 8   Period Weeks   Status New   Target Date 11/18/16     PT LONG TERM GOAL #3   Title pt to be able to lift and lower >/= 8# to and from and over head shelf and push/pull >/= 15# with </= 1/10 pain for functional strength required for ADLs    Time 8   Period Weeks   Status New   Target Date 11/18/16     PT LONG TERM GOAL #4   Title increase FOTO score to </=30% to demo improvement in function    Time 8   Period Weeks   Status New   Target Date 11/18/16     PT LONG TERM GOAL #5   Title pt to be I with all HEp given as of last visit    Time 8   Period Weeks   Status New   Target Date 11/18/16               Plan - 11/24/16 1047    Clinical Impression Statement Appears to have better motion with continued pain at end range. His pain continues to be around the bicpes area. He feels like sonthing is shifting in his shoulder when he reaches up overhead. The patient has 1 more visit schedueld next week. Therapy will proceed per MD recocmendation.    Clinical Presentation Stable  Clinical Decision Making Low   Rehab Potential Good   PT Frequency 2x / week   PT Duration 4 weeks   PT  Treatment/Interventions ADLs/Self Care Home Management;Cryotherapy;Electrical Stimulation;Iontophoresis 4mg /ml Dexamethasone;Moist Heat;Ultrasound;Therapeutic activities;Therapeutic exercise;Taping;Manual techniques;Passive range of motion;Patient/family education;Neuromuscular re-education;Vasopneumatic Device   PT Next Visit Plan ERO, strengthening for scapular stability, tricep/bicep strengthening, RCR protocol , if pt continues to progress discuss d/C    PT Home Exercise Plan scapular retraction, table slides flexion/ abduction, Isometric strengthening IR/ER/ flexion/ Extension, wand flexion/ abduction in standing   Consulted and Agree with Plan of Care Patient      Patient will benefit from skilled therapeutic intervention in order to improve the following deficits and impairments:  Pain, Improper body mechanics, Postural dysfunction, Decreased strength, Decreased mobility, Decreased range of motion, Decreased activity tolerance, Decreased endurance, Increased fascial restricitons, Increased muscle spasms, Obesity, Decreased safety awareness  Visit Diagnosis: Acute pain of left shoulder  Stiffness of left shoulder, not elsewhere classified  Muscle weakness (generalized)     Problem List Patient Active Problem List   Diagnosis Date Noted  . S/P left rotator cuff repair 10/25/2016  . Severe obesity (BMI >= 40) (HCC) 09/27/2016  . Dilated aortic root (HCC) 07/20/2016  . Claudication (HCC) 07/20/2016  . Heart murmur 07/20/2016  . Trigger finger, left middle finger 06/21/2016  . Diabetes (HCC) 05/24/2016  . Skin induration 02/19/2016  . Depression, major, recurrent, moderate (HCC) 11/05/2015  . Chronic post-traumatic stress disorder (PTSD) 11/05/2015    Class: Chronic  . OSA (obstructive sleep apnea)   . Persistent atrial fibrillation (HCC) 08/19/2015  . CAD (coronary artery disease), native coronary artery 08/19/2015  . Shoulder pain 06/11/2015  . Hyperlipidemia 06/10/2015  .  Tobacco use disorder 05/28/2015  . Essential hypertension 05/27/2015  . Bipolar disorder (HCC) 05/27/2015  . 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 Obstructive sleep apnea 05/27/2015    Dessie Comaavid J Taeler Winning PT DPT  11/24/2016, 3:23 PM  [facility] [address] [place], [place], [address] Phone: [phone]   Fax:  [phone]  Name: Kyle Peterson MRN: [id number] Date of Birth: 01-21-1966

## 2016-11-25 ENCOUNTER — Encounter (INDEPENDENT_AMBULATORY_CARE_PROVIDER_SITE_OTHER): Payer: Self-pay | Admitting: Orthopaedic Surgery

## 2016-11-25 ENCOUNTER — Ambulatory Visit (INDEPENDENT_AMBULATORY_CARE_PROVIDER_SITE_OTHER): Payer: Medicare HMO | Admitting: Orthopaedic Surgery

## 2016-11-25 VITALS — BP 134/86 | HR 71 | Ht 68.0 in | Wt 299.0 lb

## 2016-11-25 DIAGNOSIS — M25512 Pain in left shoulder: Secondary | ICD-10-CM

## 2016-11-25 DIAGNOSIS — Z9889 Other specified postprocedural states: Secondary | ICD-10-CM

## 2016-11-25 MED ORDER — BUPIVACAINE HCL 0.25 % IJ SOLN
4.0000 mL | INTRAMUSCULAR | Status: AC | PRN
  Administered 2016-11-25: 4 mL via INTRA_ARTICULAR

## 2016-11-25 MED ORDER — METHYLPREDNISOLONE ACETATE 40 MG/ML IJ SUSP
40.0000 mg | INTRAMUSCULAR | Status: AC | PRN
  Administered 2016-11-25: 40 mg via INTRA_ARTICULAR

## 2016-11-25 MED ORDER — LIDOCAINE HCL 1 % IJ SOLN
3.0000 mL | INTRAMUSCULAR | Status: AC | PRN
  Administered 2016-11-25: 3 mL

## 2016-11-25 NOTE — Progress Notes (Signed)
Office Visit Note   Patient: Kyle Peterson           Date of Birth: 05/28/1965           MRN: [id number] Visit Date: 11/25/2016              Requested by: Carney Living, MD [address] [place], [place] [address] PCP: Carney Living, MD   Assessment & Plan: Visit Diagnoses:  1. S/P left rotator cuff repair     Plan: In attempt to give patient some relief of his shoulder pain we elected to try injection.  At the patient sent left shoulder was prepped with Betadine and subacromial Marcaine/Depo-Medrol injection was performed.  Tolerated procedure without complication.  Follow-up in 2 weeks for recheck.  Right before that appointment if he still continues to have ongoing pain he will call and let me know and I will schedule MRI right shoulder with contrast.  He will resume PT next Wednesday.  Questions answered. He understands no more heavy lifting.  Follow-Up Instructions: Return in about 2 weeks (around 12/09/2016).   Orders:  No orders of the defined types were placed in this encounter.  No orders of the defined types were placed in this encounter.     Procedures: Large Joint Inj Date/Time: 11/25/2016 2:54 PM Performed by: Naida Sleight Authorized by: Naida Sleight   Consent Given by:  Patient Indications:  Pain Location:  Shoulder Site:  L subacromial bursa Needle Size:  25 G Needle Length:  1.5 inches Approach:  Posterior Ultrasound Guidance: No   Fluoroscopic Guidance: No   Arthrogram: No   Medications:  3 mL lidocaine 1 %; 4 mL bupivacaine 0.25 %; 40 mg methylPREDNISolone acetate 40 MG/ML Aspiration Attempted: No   Patient tolerance:  Patient tolerated the procedure well with no immediate complications     Clinical Data: No additional findings.   Subjective: Chief Complaint  Patient presents with  . Left Shoulder - Follow-up, Routine Post Op    HPI Patient comes in with complaints of left shoulder pain.  About 2 months status post  left shoulder scope with open rotator cuff repair.  States that 2 weeks ago he was lifting a heavy box weighing about 200 pounds and he felt a sharp pain in the anterior and lateral shoulder.  Pain with overhead reaching and going behind his back.  States that he felt something pull in the shoulder. Review of Systems No current cardiac pulmonary GI GU issues.  Objective: Vital Signs: BP 134/86   Pulse 71   Ht 5\' 8"  (1.727 m)   Wt 299 lb (135.6 kg)   BMI 45.46 kg/m   Physical Exam  Constitutional: He is oriented to person, place, and time.  HENT:  Head: Normocephalic and atraumatic.  Eyes: Pupils are equal, round, and reactive to light. EOM are normal.  Neck: Normal range of motion.  Abdominal: He exhibits no distension.  Musculoskeletal:  Exam left shoulder he has good range of motion.  Negative drop arm test.  Pain with supraspinatus resistance.  Neurological: He is alert and oriented to person, place, and time.  Skin: Skin is warm and dry.    Ortho Exam  Specialty Comments:  No specialty comments available.  Imaging: No results found.   PMFS History: Patient Active Problem List   Diagnosis Date Noted  . S/P left rotator cuff repair 10/25/2016  . Severe obesity (BMI >= 40) (HCC) 09/27/2016  . Dilated aortic root (HCC)  07/20/2016  . Claudication (HCC) 07/20/2016  . Heart murmur 07/20/2016  . Trigger finger, left middle finger 06/21/2016  . Diabetes (HCC) 05/24/2016  . Skin induration 02/19/2016  . Depression, major, recurrent, moderate (HCC) 11/05/2015  . Chronic post-traumatic stress disorder (PTSD) 11/05/2015    Class: Chronic  . OSA (obstructive sleep apnea)   . Persistent atrial fibrillation (HCC) 08/19/2015  . CAD (coronary artery disease), native coronary artery 08/19/2015  . Shoulder pain 06/11/2015  . Hyperlipidemia 06/10/2015  . Tobacco use disorder 05/28/2015  . Essential hypertension 05/27/2015  . Bipolar disorder (HCC) 05/27/2015  . Hypertrophic  cardiomyopathy (HCC) 05/27/2015  . Obstructive sleep apnea 05/27/2015   Past Medical History:  Diagnosis Date  . AICD (automatic cardioverter/defibrillator) present    bostan scien  . Anginal pain (HCC)    ?heart pain   per dr Estill Doomscamntiz  . Anxiety   . Arthritis    ddd  . CAD (coronary artery disease), native coronary artery 08/19/2015  . CHF (congestive heart failure) (HCC)   . Depression   . Diabetes mellitus without complication (HCC)    no meds  new dx  . Heart murmur   . High cholesterol   . Hypertension   . Myocardial infarction (HCC)   . OSA (obstructive sleep apnea)    Severe with AHI 80/hr- new bipap  . PAF (paroxysmal atrial fibrillation) (HCC) 08/19/2015  . PTSD (post-traumatic stress disorder)   . Stroke (HCC) 2016  . Substance abuse (HCC)     Family History  Problem Relation Age of Onset  . Diabetes Mother   . Hypertension Mother   . Diabetes Father   . Hypertension Father   . Heart disease Father   . Alcohol abuse Father   . Alcohol abuse Brother   . Drug abuse Brother     Past Surgical History:  Procedure Laterality Date  . CARDIAC CATHETERIZATION     01/13/14 [facility]: LM NL, mild narrowing of proximal/mid LAD, mid LCX, proximal RCA. 30% ostial OM1. 40% distal RCA. Medical tx.   . CARPAL TUNNEL RELEASE Left 11/02/2015  . CERVICAL DISCECTOMY    . MYOMECTOMY    . SHOULDER ARTHROSCOPY WITH ROTATOR CUFF REPAIR Left 09/09/2016   Procedure: LEFT SHOULDER ARTHROSCOPY WITH MINI OPEN ROTATOR CUFF REPAIR;  Surgeon: Eldred MangesYates, Mark C, MD;  Location: [facility] OR;  Service: Orthopedics;  Laterality: Left;  . TONSILLECTOMY     Social History   Occupational History  . Not on file.   Social History Main Topics  . Smoking status: Current Every Day Smoker    Packs/day: 0.50    Years: 40.00    Types: Cigarettes  . Smokeless tobacco: Never Used     Comment: Trying to cut back   . Alcohol use No  . Drug use: Yes    Frequency: 3.0 times per week    Types: Marijuana       Comment: last 07/26/16  . Sexual activity: Not Currently

## 2016-11-28 ENCOUNTER — Ambulatory Visit (INDEPENDENT_AMBULATORY_CARE_PROVIDER_SITE_OTHER): Payer: Medicare HMO | Admitting: Family Medicine

## 2016-11-28 ENCOUNTER — Encounter: Payer: Self-pay | Admitting: Family Medicine

## 2016-11-28 DIAGNOSIS — E119 Type 2 diabetes mellitus without complications: Secondary | ICD-10-CM

## 2016-11-28 NOTE — Progress Notes (Signed)
Medical Nutrition Therapy:  Appt start time: 1100 end time:  1200.  Assessment:  Primary concerns today: Weight management and Blood sugar control.  Kyle Peterson has been working on losing weight, and he would like to weigh 200 lb.  Also wants to get his BG under better control.  He lives alone, and likes to cook, but frequently resorts to [employer] dollar menu.  Financial constraints are an obstacle for Kyle Peterson.    Kyle Peterson has severe OSA; uses a bi-PAP machine, and usually sleep 3-5 hours most nights.  He does not have a consistent sleep or eating pattern.  Usually drinks at least 1 pot of coffee (12 cups) per day, sometimes 2, which equals [phone] kcal, 144-288 g sugar, and 36-72 g fat from the 2 tbsp creamer he uses in each cup.    Learning Readiness: Change in progress; Kyle Peterson has cut out most sugar and sweets.  He has lost ~18 lb since starting these efforts.    Usual eating pattern includes 0-2 meals and 0-3 snacks per day. Frequent foods and beverages include coffee w/ artif swtner, flvrd creamer, diet swt tea, water.  Avoided foods include none.   Usual physical activity includes none.  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    24-hr recall: (Up at 3 AM) B (3:15 AM)-   24 c coffee: 2 tbsp flavored creamer & 1 tsp artif swtnr in each cup.  Snk ( AM)-   --- L ( PM)-  water Snk ( PM)-  --- D ( PM)-  1 c strir-fried chx & veg's, 2 c rice, diet sweet tea Snk ( PM)-  --- Typical day? Yes.    Progress Towards Goal(s):  In progress.   Nutritional Diagnosis:  NB-1.1 Food and nutrition-related knowledge deficit As related to weight and blood glucose management.  As evidenced by erratic eating schedule and inadequate diet composition, which includes 12-24 cups of coffee per day.    Intervention:  Nutrition education  Handouts given during visit include:  AVS  Demonstrated degree of understanding via:  Teach Back  Barriers to  learning/adherence to lifestyle change: Longstanding poor dietary pattern and composition.  Monitoring/Evaluation:  Dietary intake, exercise, and body weight in 4 week(s).

## 2016-11-28 NOTE — Patient Instructions (Addendum)
  Diet Recommendations for Diabetes and Weight Management  Carbohydrate includes starch, sugar, and fiber.  Of these, only sugar and starch raise blood glucose.  (Fiber is found in fruits, vegetables [especially skin, seeds, and stocks] and whole grains.)   Starchy (carb) foods: Bread, rice, pasta, potatoes, corn, cereal, grits, crackers, bagels, muffins, all baked goods.  (Fruits, milk, and yogurt also have carbohydrate, but most of these foods will not spike your blood sugar as most starchy foods will.)  A few fruits do cause high blood sugars; use small portions of bananas (limit to 1/2 at a time), grapes, watermelon, oranges, and most tropical fruits.   Protein foods: Meat, fish, poultry, eggs, dairy foods, and beans such as pinto and kidney beans (beans also provide carbohydrate).   1. Eat at least REAL 3 meals and 1-2 snacks per day. Never go more than 4-5 hours while awake without eating. Eat breakfast within the first hour of getting up.   - Breakfast ideas: 2 eggs, 2 slices toast, (some fruit?); cheese toast or meat sandwich.   2. Limit starchy foods to TWO per meal and ONE per snack. ONE portion of a starchy  food is equal to the following:   - ONE slice of bread (or its equivalent, such as half of a hamburger bun).   - 1/2 cup of a "scoopable" starchy food such as potatoes or rice.   - 15 grams of Total Carbohydrate as shown on food label.  3. Include at every meal: a protein food, a carb food, and vegetables and/or fruit.   - Obtain twice the volume of veg's as protein or carbohydrate foods for both lunch and dinner.   - Fresh or frozen veg's are best.   - Keep frozen veg's on hand for a quick vegetable serving.      - Check out [facility] on [address]   - I will call you with info on the Food Box program and nutrition classes.   - Please consider cutting back on your coffee intake; for starters, consume no more than 12 cups (1 pot) per day, and aim for NO coffee or tea after 2  PM each day.  SLEEP IS ESSENTIAL TO YOUR HEALTH, INCLUDING BLOOD SUGAR CONTROL AND WEIGHT.   - Advance planning will be key to your success.

## 2016-11-29 DIAGNOSIS — G4733 Obstructive sleep apnea (adult) (pediatric): Secondary | ICD-10-CM | POA: Diagnosis not present

## 2016-11-30 ENCOUNTER — Encounter: Payer: Self-pay | Admitting: Physical Therapy

## 2016-11-30 ENCOUNTER — Ambulatory Visit: Payer: Medicare HMO | Admitting: Physical Therapy

## 2016-11-30 DIAGNOSIS — M6281 Muscle weakness (generalized): Secondary | ICD-10-CM | POA: Diagnosis not present

## 2016-11-30 DIAGNOSIS — M25512 Pain in left shoulder: Secondary | ICD-10-CM | POA: Diagnosis not present

## 2016-11-30 DIAGNOSIS — M25612 Stiffness of left shoulder, not elsewhere classified: Secondary | ICD-10-CM

## 2016-11-30 NOTE — Therapy (Signed)
[facility] [address] [place], [place], [address] Phone: [phone]   Fax:  [phone]  Physical Therapy Treatment  Patient Details  Name: Kyle Peterson MRN: [id number] Date of Birth: 1965-05-02 Referring Provider: Annell Greening MD  Encounter Date: 11/30/2016      PT End of Session - 11/30/16 1147    Visit Number 14   Number of Visits 17   Date for PT Re-Evaluation 11/18/16   Authorization Type MCR: Kx mod by 15th visit, progress note by 19th visit   PT Start Time 1103   PT Stop Time 1141   PT Time Calculation (min) 38 min   Activity Tolerance Patient tolerated treatment well   Behavior During Therapy [facility] for tasks assessed/performed      Past Medical History:  Diagnosis Date  . AICD (automatic cardioverter/defibrillator) present    bostan scien  . Anginal pain (HCC)    ?heart pain   per dr Estill Dooms  . Anxiety   . Arthritis    ddd  . CAD (coronary artery disease), native coronary artery 08/19/2015  . CHF (congestive heart failure) (HCC)   . Depression   . Diabetes mellitus without complication (HCC)    no meds  new dx  . Heart murmur   . High cholesterol   . Hypertension   . Myocardial infarction (HCC)   . OSA (obstructive sleep apnea)    Severe with AHI 80/hr- new bipap  . PAF (paroxysmal atrial fibrillation) (HCC) 08/19/2015  . PTSD (post-traumatic stress disorder)   . Stroke (HCC) 2016  . Substance abuse [facility])     Past Surgical History:  Procedure Laterality Date  . CARDIAC CATHETERIZATION     01/13/14 [facility]: LM NL, mild narrowing of proximal/mid LAD, mid LCX, proximal RCA. 30% ostial OM1. 40% distal RCA. Medical tx.   . CARPAL TUNNEL RELEASE Left 11/02/2015  . CERVICAL DISCECTOMY    . MYOMECTOMY    . SHOULDER ARTHROSCOPY WITH ROTATOR CUFF REPAIR Left 09/09/2016   Procedure: LEFT SHOULDER ARTHROSCOPY WITH MINI OPEN ROTATOR CUFF REPAIR;  Surgeon: Eldred Manges, MD;  Location: [facility] OR;  Service:  Orthopedics;  Laterality: Left;  . TONSILLECTOMY      There were no vitals filed for this visit.      Subjective Assessment - 11/30/16 1316    Subjective Patient had a shot last week. He feels like his shoulder is doing much better. he is having no pain. He feels comfortable with his HEP.    Currently in Pain? No/denies            [facility] PT Assessment - 11/30/16 0001      Observation/Other Assessments   Focus on Therapeutic Outcomes (FOTO)  30% limitation      AROM   Overall AROM Comments can reach behind head and to t-12 behind his back     PROM   Left Shoulder Flexion 149 Degrees   Left Shoulder ABduction 140 Degrees   Left Shoulder Internal Rotation 85 Degrees   Left Shoulder External Rotation 80 Degrees     Strength   Overall Strength Comments left shoulder flexion 4+/5    Left Shoulder ABduction 4+/5     Palpation   Palpation comment No tenderness to palpation                      [facility] Adult PT Treatment/Exercise - 11/30/16 0001      Neck Exercises: Machines for Strengthening  UBE (Upper Arm Bike) 2 min forward 2 back    Cybex Row x20 each handle 20 lb    Other Machines for Strengthening lat pull down x20 25lbs      Shoulder Exercises: Lawyertretch   Corner Stretch 2 reps;30 seconds   Other Shoulder Stretches Levator scapulae stretch 2 x 30      Manual Therapy   Manual therapy comments manual trigger point release over the L pec minor, middle deltoid, and mid bicep brachii   Soft tissue mobilization IASTM over the deltoid anterior/ middle portions; bicpes and subscap area    Scapular Mobilization upward rotation with shoulder action flexion /abduction 2 x 10 with wall slides                PT Education - 11/30/16 1322    Education provided Yes   Education Details reviewed HEP           PT Short Term Goals - 11/30/16 1327      PT SHORT TERM GOAL #1   Title pt to be I with initial HEP   Baseline perfroming at home    Time 4    Period Weeks   Status Achieved     PT SHORT TERM GOAL #2   Title pt to increase L shoulder PROM/AAROM to >/= 90 degrees flexion/ abduction and 45 degrees of ER/ IR to promote shoulder mobility with </= 5/10 pain   Time 4   Period Weeks   Status Achieved     PT SHORT TERM GOAL #3   Title pt will increase L grip strength by >/= 5# to demo functional improvement in shoulder   Time 4   Period Weeks   Status Achieved           PT Long Term Goals - 11/30/16 1328      PT LONG TERM GOAL #1   Title pt to increase L shoulder flexion/ abduction to >/= 120 degrees and IR/ER to >/=60 degrees with </= 1/10 pain for functional mobility required for ADLS   Time 8   Period Weeks   Status Achieved     PT LONG TERM GOAL #2   Title pt to increase L shoulder to >/= 4/5 in all planes to provide stability and assist with lifting and carrying activities    Time 8   Period Weeks   Status Achieved     PT LONG TERM GOAL #3   Title pt to be able to lift and lower >/= 8# to and from and over head shelf and push/pull >/= 15# with </= 1/10 pain for functional strength required for ADLs    Time 8   Period Weeks   Status Achieved     PT LONG TERM GOAL #4   Title increase FOTO score to </=30% to demo improvement in function    Baseline 30%    Time 8   Period Weeks   Status Achieved     PT LONG TERM GOAL #5   Title pt to be I with all HEp given as of last visit    Baseline HEP    Time 8   Period Weeks   Status Achieved               Plan - 11/30/16 1326    Clinical Impression Statement Patient has reached goals for therapy. He has full functional use of his shoulder. He has had no pain. He was advised if he continues his HEP and  has pain to stop. D/C to HEP    Clinical Presentation Stable   Clinical Decision Making Low   Rehab Potential Good   PT Frequency 2x / week   PT Duration 4 weeks   PT Treatment/Interventions ADLs/Self Care Home Management;Cryotherapy;Electrical  Stimulation;Iontophoresis 4mg /ml Dexamethasone;Moist Heat;Ultrasound;Therapeutic activities;Therapeutic exercise;Taping;Manual techniques;Passive range of motion;Patient/family education;Neuromuscular re-education;Vasopneumatic Device   PT Next Visit Plan ERO, strengthening for scapular stability, tricep/bicep strengthening, RCR protocol , if pt continues to progress discuss d/C    PT Home Exercise Plan scapular retraction, table slides flexion/ abduction, Isometric strengthening IR/ER/ flexion/ Extension, wand flexion/ abduction in standing   Consulted and Agree with Plan of Care Patient      Patient will benefit from skilled therapeutic intervention in order to improve the following deficits and impairments:  Pain, Improper body mechanics, Postural dysfunction, Decreased strength, Decreased mobility, Decreased range of motion, Decreased activity tolerance, Decreased endurance, Increased fascial restricitons, Increased muscle spasms, Obesity, Decreased safety awareness  Visit Diagnosis: Acute pain of left shoulder  Stiffness of left shoulder, not elsewhere classified  Muscle weakness (generalized)       G-Codes - 12-25-2016 1328    Functional Assessment Tool Used (Outpatient Only) ROM, FOTO    Functional Limitation Carrying, moving and handling objects   Carrying, Moving and Handling Objects Goal Status ([id number]) At least 20 percent but less than 40 percent impaired, limited or restricted   Carrying, Moving and Handling Objects Discharge Status [phone]) At least 20 percent but less than 40 percent impaired, limited or restricted      Problem List Patient Active Problem List   Diagnosis Date Noted  . S/P left rotator cuff repair 10/25/2016  . Severe obesity (BMI >= 40) (HCC) 09/27/2016  . Dilated aortic root (HCC) 07/20/2016  . Claudication (HCC) 07/20/2016  . Heart murmur 07/20/2016  . Trigger finger, left middle finger 06/21/2016  . Diabetes (HCC) 05/24/2016  . Skin induration  02/19/2016  . Depression, major, recurrent, moderate (HCC) 11/05/2015  . Chronic post-traumatic stress disorder (PTSD) 11/05/2015    Class: Chronic  . OSA (obstructive sleep apnea)   . Persistent atrial fibrillation (HCC) 08/19/2015  . CAD (coronary artery disease), native coronary artery 08/19/2015  . Shoulder pain 06/11/2015  . Hyperlipidemia 06/10/2015  . Tobacco use disorder 05/28/2015  . Essential hypertension 05/27/2015  . Bipolar disorder (HCC) 05/27/2015  . 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 Obstructive sleep apnea 05/27/2015    Dessie Coma PT DPT  12-25-16, 1:30 PM  [facility] [address] [place], [place], [address] Phone: [phone]   Fax:  [phone]  Name: Kyle Peterson MRN: [id number] Date of Birth: 19-Feb-1965

## 2016-12-09 ENCOUNTER — Encounter (INDEPENDENT_AMBULATORY_CARE_PROVIDER_SITE_OTHER): Payer: Self-pay | Admitting: Orthopaedic Surgery

## 2016-12-09 ENCOUNTER — Ambulatory Visit (INDEPENDENT_AMBULATORY_CARE_PROVIDER_SITE_OTHER): Payer: Medicare HMO | Admitting: Orthopaedic Surgery

## 2016-12-09 VITALS — Ht 68.0 in | Wt 289.0 lb

## 2016-12-09 DIAGNOSIS — Z9889 Other specified postprocedural states: Secondary | ICD-10-CM

## 2016-12-09 NOTE — Progress Notes (Signed)
Post-Op Visit Note   Patient: Kyle ArnJon Dossantos           Date of Birth: Jan 16, 1966           MRN: [id number] Visit Date: 12/09/2016 PCP: Carney Livinghambliss, Marshall L, MD   Assessment & Plan: 51 year old male returns post left shoulder arthroscopy with mini open rotator cuff repair.  He had an injection 11/25/2016 and states his shoulder is gradually continuing to improve.  Is gotten improvement in his function since before surgery and states each couple weeks he is noticed that it is made progress.  At this point we can release him and check him back again as needed.  He has had some problems with his opposite right shoulder but it seems to be doing fairly well at this point.  Chief Complaint:  Chief Complaint  Patient presents with  . Left Shoulder - Follow-up   Visit Diagnoses: Post rotator cuff repair, left shoulder  Plan: Gradual increase activity as tolerated he is very happy with the surgical result.  Return as needed.  Follow-Up Instructions: Return if symptoms worsen or fail to improve.   Orders:  No orders of the defined types were placed in this encounter.  No orders of the defined types were placed in this encounter.   Imaging: No results found.  PMFS History: Patient Active Problem List   Diagnosis Date Noted  . S/P left rotator cuff repair 10/25/2016  . Severe obesity (BMI >= 40) (HCC) 09/27/2016  . Dilated aortic root (HCC) 07/20/2016  . Claudication (HCC) 07/20/2016  . Heart murmur 07/20/2016  . Trigger finger, left middle finger 06/21/2016  . Diabetes (HCC) 05/24/2016  . Skin induration 02/19/2016  . Depression, major, recurrent, moderate (HCC) 11/05/2015  . Chronic post-traumatic stress disorder (PTSD) 11/05/2015    Class: Chronic  . OSA (obstructive sleep apnea)   . Persistent atrial fibrillation (HCC) 08/19/2015  . CAD (coronary artery disease), native coronary artery 08/19/2015  . Shoulder pain 06/11/2015  . Hyperlipidemia 06/10/2015  . Tobacco use disorder  05/28/2015  . Essential hypertension 05/27/2015  . Bipolar disorder (HCC) 05/27/2015  . 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 Obstructive sleep apnea 05/27/2015   Past Medical History:  Diagnosis Date  . AICD (automatic cardioverter/defibrillator) present    bostan scien  . Anginal pain (HCC)    ?heart pain   per dr Estill Doomscamntiz  . Anxiety   . Arthritis    ddd  . CAD (coronary artery disease), native coronary artery 08/19/2015  . CHF (congestive heart failure) (HCC)   . Depression   . Diabetes mellitus without complication (HCC)    no meds  new dx  . Heart murmur   . High cholesterol   . Hypertension   . Myocardial infarction (HCC)   . OSA (obstructive sleep apnea)    Severe with AHI 80/hr- new bipap  . PAF (paroxysmal atrial fibrillation) (HCC) 08/19/2015  . PTSD (post-traumatic stress disorder)   . Stroke (HCC) 2016  . Substance abuse (HCC)     Family History  Problem Relation Age of Onset  . Diabetes Mother   . Hypertension Mother   . Diabetes Father   . Hypertension Father   . Heart disease Father   . Alcohol abuse Father   . Alcohol abuse Brother   . Drug abuse Brother     Past Surgical History:  Procedure Laterality Date  . CARDIAC CATHETERIZATION     [facility]/13/14 [facility]: LM NL, mild narrowing of proximal/mid LAD, mid  LCX, proximal RCA. 30% ostial OM1. 40% distal RCA. Medical tx.   . CARPAL TUNNEL RELEASE Left 11/02/2015  . CERVICAL DISCECTOMY    . MYOMECTOMY    . TONSILLECTOMY     Social History   Occupational History  . Not on file  Tobacco Use  . Smoking status: Current Every Day Smoker    Packs/day: 0.50    Years: 40.00    Pack years: 20.00    Types: Cigarettes  . Smokeless tobacco: Never Used  . Tobacco comment: Trying to cut back   Substance and Sexual Activity  . Alcohol use: No    Alcohol/week: 0.0 oz  . Drug use: Yes    Frequency: 3.0 times per week    Types: Marijuana    Comment: last 07/26/16  . Sexual activity: Not  Currently

## 2016-12-14 ENCOUNTER — Other Ambulatory Visit: Payer: Self-pay | Admitting: Family Medicine

## 2016-12-14 ENCOUNTER — Telehealth: Payer: Self-pay | Admitting: Cardiology

## 2016-12-14 DIAGNOSIS — I480 Paroxysmal atrial fibrillation: Secondary | ICD-10-CM

## 2016-12-14 MED ORDER — RIVAROXABAN 20 MG PO TABS: 20.0000 mg | ORAL_TABLET | Freq: Every day | ORAL | 1 refills | Status: DC

## 2016-12-14 NOTE — Telephone Encounter (Signed)
New Message    Needs prescription sent for renewal .pharmacy told patient they never received a refill for this medication   *STAT* If patient is at the pharmacy, call can be transferred to refill team.   1. Which medications need to be refilled? (please list name of each medication and dose if known) metoprolol , atorvastatin, furosemide , losartan . Xarelto,  2. Which pharmacy/location (including street and city if local pharmacy) is medication to be sent to? [facility] mail order   3. Do they need a 30 day or 90 day supply? 90

## 2016-12-14 NOTE — Telephone Encounter (Signed)
All requested medications have refills through [facility] except the xarelto. Routing to appropriate pool to be addressed.

## 2016-12-14 NOTE — Telephone Encounter (Signed)
Pt needs refills on metoprolol and cartia.  He has been [facility] by [facility] had contact us for refills but with no response.  [facility].  He only has about 4 days left on each prescription.

## 2016-12-15 MED ORDER — METOPROLOL SUCCINATE ER 50 MG PO TB24: 50.0000 mg | ORAL_TABLET | Freq: Every day | ORAL | 1 refills | Status: DC

## 2016-12-15 MED ORDER — DILTIAZEM HCL ER COATED BEADS 240 MG PO CP24: 240.0000 mg | ORAL_CAPSULE | Freq: Every day | ORAL | 3 refills | Status: DC

## 2016-12-19 ENCOUNTER — Telehealth: Payer: Self-pay | Admitting: Family Medicine

## 2016-12-19 ENCOUNTER — Ambulatory Visit (INDEPENDENT_AMBULATORY_CARE_PROVIDER_SITE_OTHER): Payer: Medicare HMO | Admitting: *Deleted

## 2016-12-19 DIAGNOSIS — I422 Other hypertrophic cardiomyopathy: Secondary | ICD-10-CM | POA: Diagnosis not present

## 2016-12-19 NOTE — Telephone Encounter (Signed)
[facility] pharmacist is requesting a short supply of metroplol and cardia sent to a local pharmacy,  [facility] [place].  She is requesting an overide.  Please call pt when this is done

## 2016-12-19 NOTE — Progress Notes (Signed)
Remote ICD transmission.   

## 2016-12-19 NOTE — Telephone Encounter (Signed)
Patient requesting 2 week supply of cartia and metoprolol at local pharmacy as [facility] has told him it will be 10-14 days before he receives shipment from them. 90 day supply with 3 refills on cartia and metoprolol sent [facility] on 11/14. Sp[facility] at [facility] who states they did not receive these scripts. 2 week supply with no refills of both these meds [facility]. Patient aware. Kinnie FeilL. [place], RN, BSN '

## 2016-12-20 DIAGNOSIS — G4733 Obstructive sleep apnea (adult) (pediatric): Secondary | ICD-10-CM | POA: Diagnosis not present

## 2016-12-20 LAB — CUP PACEART REMOTE DEVICE CHECK
Battery Remaining Longevity: 144 mo
Date Time Interrogation Session: 20181119074500
HighPow Impedance: 68 Ohm
Implantable Lead Location: 753860
Implantable Lead Model: 292
Implantable Lead Serial Number: 358834
Implantable Pulse Generator Implant Date: 20160218
Lead Channel Impedance Value: 793 Ohm
Lead Channel Pacing Threshold Pulse Width: 0.5 ms
Lead Channel Setting Pacing Amplitude: 2 V
MDC IDC LEAD IMPLANT DT: 20160218
MDC IDC MSMT BATTERY REMAINING PERCENTAGE: 100 %
MDC IDC MSMT LEADCHNL RV PACING THRESHOLD AMPLITUDE: 0.7 V
MDC IDC SET LEADCHNL RV PACING PULSEWIDTH: 0.5 ms
MDC IDC SET LEADCHNL RV SENSING SENSITIVITY: 0.3 mV
MDC IDC STAT BRADY RV PERCENT PACED: 0 %
Pulse Gen Serial Number: 202913

## 2016-12-26 ENCOUNTER — Ambulatory Visit (INDEPENDENT_AMBULATORY_CARE_PROVIDER_SITE_OTHER): Payer: Medicare HMO | Admitting: Family Medicine

## 2016-12-26 ENCOUNTER — Encounter: Payer: Self-pay | Admitting: Family Medicine

## 2016-12-26 DIAGNOSIS — Z6841 Body Mass Index (BMI) 40.0 and over, adult: Secondary | ICD-10-CM

## 2016-12-26 DIAGNOSIS — E119 Type 2 diabetes mellitus without complications: Secondary | ICD-10-CM | POA: Diagnosis not present

## 2016-12-26 NOTE — Patient Instructions (Addendum)
-   [employer] Food Box Program: [address], [place] [id number].  Tell Darl PikesSusan you were referred by [facility] (Dr. Gerilyn PilgrimSykes).   Recommendations:  - Eat at least 3 REAL meals and 1-2 snacks per day.  Aim for no more than 5 hours between eating.  Eat breakfast within one hour of getting up.  - Definition of a REAL meal: includes at least some protein, some starch, and vegetables and/or fruit.     OR: Would you serve this to a guest in your home, and call it a meal?   BREAKFAST: Include at least a source of protein and a source of starch.     THE KEY TO MAKING THIS HAPPEN IS PLANNING AHEAD.    - Track the number of times you eat an actual meal and a snack each day on your kitchen calendar.    At the end of each week, tally the days in which you achieved the goal of 3 meals and 1 snack.    Bring to follow-up appt.    - Call if you have Qs:  [phone] (Dr. Gerilyn PilgrimSykes ).

## 2016-12-26 NOTE — Progress Notes (Signed)
Medical Nutrition Therapy:  Appt start time: 1000 end time:  1100.  Assessment:  Primary concerns today: Weight management and Blood sugar control.  Kyle Peterson has cut back on coffee, drinking no more than 12 cups per day and not drinking any after 2 PM.  He does drink tea late in the day, however.  He feels a little more irritable toward evening.  His eating is still sporadic; estimates he is eating one meal on most days, with additional snacks.    Kyle Peterson is still usually sleeping 3-5 hours most nights, and gets a 30-min nap in early afternoon, which he said he has little choice about; sometimes unable to stay awake by then.    Kyle Peterson has not yet contacted the [employer] food box program about nutrition/cooking classes b/c he misplaced the address.  I gave him that information again, and he hopes to go today.  Also gave him information on other food assistance programs.   Learning Readiness: Change in progress.  Significant change in coffee/caffeine intake, but no other meaningful behavior changes.    Usual eating pattern still includes 0-2 meals and 0-3 snacks per day. Frequent foods and beverages include coffee w/ artif swtner, flvrd creamer, diet swt tea, water.  Avoided foods include none.   Usual physical activity includes none.     24-hr recall:  (Up at 3 AM) B (3:30 AM)-  12 c coffee, creamer and artificial sweetener Snk ( AM)-  --- L (1 PM)-  1 Mal[place]key sandwich, fat-free mayo, Amer cheese, diet sweet tea 1:30: 15-min nap --- Snk ( PM)-  12 oz diet swt tea D (6 PM)-  2 c chx corn chowder soup, 12 oz diet swt tea Snk (11 PM)-  12 oz diet swt tea Typical day? No.  Never has a "normal" day.    Progress Towards Goal(s):  In progress.   Nutritional Diagnosis:  Some progress noted on NB-1.1 Food and nutrition-related knowledge deficit As related to weight and blood glucose management.  As evidenced by erratic eating schedule and inadequate diet composition, but reduced coffee intake to 12 cups  per day.    Intervention:  Nutrition education  Handouts given during visit include:  AVS  [place] 2 food assistance programs (in addition to [employer] program)  Demonstrated degree of understanding via:  Teach Back  Barriers to learning/adherence to lifestyle change: Longstanding poor dietary pattern and composition.  Monitoring/Evaluation:  Dietary intake, exercise, and body weight in 3 week(s).

## 2016-12-29 ENCOUNTER — Encounter: Payer: Self-pay | Admitting: Cardiology

## 2017-01-16 ENCOUNTER — Ambulatory Visit: Payer: Self-pay | Admitting: Family Medicine

## 2017-01-19 DIAGNOSIS — G4733 Obstructive sleep apnea (adult) (pediatric): Secondary | ICD-10-CM | POA: Diagnosis not present

## 2017-02-19 DIAGNOSIS — G4733 Obstructive sleep apnea (adult) (pediatric): Secondary | ICD-10-CM | POA: Diagnosis not present

## 2017-02-20 DIAGNOSIS — G4733 Obstructive sleep apnea (adult) (pediatric): Secondary | ICD-10-CM | POA: Diagnosis not present

## 2017-02-20 DIAGNOSIS — J449 Chronic obstructive pulmonary disease, unspecified: Secondary | ICD-10-CM | POA: Diagnosis not present

## 2017-03-16 DIAGNOSIS — G4733 Obstructive sleep apnea (adult) (pediatric): Secondary | ICD-10-CM | POA: Diagnosis not present

## 2017-03-19 NOTE — Progress Notes (Signed)
Electrophysiology Office Note   Date:  03/20/2017   ID:  Kyle Peterson Nehme, DOB 1965-12-11, MRN [id number]  PCP:  Carney Livinghambliss, Marshall L, MD Primary Electrophysiologist:  Regan LemmingWill Martin Layann Bluett, MD    Chief Complaint  Patient presents with  . Defib Check    PAF/Hypertrophic cardiomyopathy/Chronic systolic HF     History of Present Illness: Kyle Peterson is a 52 y.o. male who presents today for electrophysiology evaluation.   He has a history of hypertrophic cardiomyopathy and had a septal myectomy on 02/03/14 the clinic. He has an ICD, noncritical coronary disease, hypertension, hyperlipidemia, paroxysmal atrial fibrillation, sleep apnea not on CPAP, and obesity. He has a history of substance abuse, including heroin, cocaine, and heavy alcohol for 25 years, but he has been clean for the last 10 years. He is also a current smoker. Prior to his operation for HCM, he had experienced an episode of syncope. He also had significant mitral regurgitation prior to his operation.   Today, denies symptoms of orthopnea, PND, lower extremity edema, claudication, dizziness, presyncope, syncope, bleeding, or neurologic sequela. The patient is tolerating medications without difficulties.  He has been having chest pain and shortness of breath over the last few days it has worsened.  His chest pain occurs both at rest and with stress.  He did have a Myoview last summer which was read as low risk.  His pain does not necessarily improve with rest.  Past Medical History:  Diagnosis Date  . AICD (automatic cardioverter/defibrillator) present    bostan scien  . Anginal pain (HCC)    ?heart pain   per dr Estill Doomscamntiz  . Anxiety   . Arthritis    ddd  . CAD (coronary artery disease), native coronary artery 08/19/2015  . CHF (congestive heart failure) (HCC)   . Depression   . Diabetes mellitus without complication (HCC)    no meds  new dx  . Heart murmur   . High cholesterol   . Hypertension   . Myocardial infarction (HCC)     . OSA (obstructive sleep apnea)    Severe with AHI 80/hr- new bipap  . PAF (paroxysmal atrial fibrillation) (HCC) 08/19/2015  . PTSD (post-traumatic stress disorder)   . Stroke (HCC) 2016  . Substance abuse [facility](HCC)    Past Surgical History:  Procedure Laterality Date  . CARDIAC CATHETERIZATION     01/13/14 [facility]: LM NL, mild narrowing of proximal/mid LAD, mid LCX, proximal RCA. 30% ostial OM1. 40% distal RCA. Medical tx.   . CARPAL TUNNEL RELEASE Left 11/02/2015  . CERVICAL DISCECTOMY    . MYOMECTOMY    . SHOULDER ARTHROSCOPY WITH ROTATOR CUFF REPAIR Left 09/09/2016   Procedure: LEFT SHOULDER ARTHROSCOPY WITH MINI OPEN ROTATOR CUFF REPAIR;  Surgeon: Eldred MangesYates, Mark C, MD;  Location: [facility] OR;  Service: Orthopedics;  Laterality: Left;  . TONSILLECTOMY       Current Outpatient Medications  Medication Sig Dispense Refill  . atorvastatin (LIPITOR) 20 MG tablet Take 1 tablet (20 mg total) by mouth daily. 90 tablet 2  . diltiazem (CARTIA XT) 240 MG 24 hr capsule Take 1 capsule (240 mg total) daily by mouth. 90 capsule 3  . furosemide (LASIX) 20 MG tablet Take 1 tablet (20 mg total) by mouth daily. 90 tablet 2  . losartan (COZAAR) 50 MG tablet Take 1 tablet (50 mg total) by mouth daily. 90 tablet 2  . metoprolol succinate (TOPROL-XL) 50 MG 24 hr tablet Take 1 tablet (50 mg total) daily by mouth.  Take with or immediately following a meal. 90 tablet 1  . orphenadrine (NORFLEX) 100 MG tablet Take 100 mg by mouth as needed for muscle spasms.    Marland Kitchen oxyCODONE-acetaminophen (PERCOCET) 10-325 MG tablet Take 1 tablet by mouth every 4 (four) hours as needed for pain.    . rivaroxaban (XARELTO) 20 MG TABS tablet Take 1 tablet (20 mg total) daily with supper by mouth. 90 tablet 1   No current facility-administered medications for this visit.     Allergies:   Ace inhibitors   Social History:  The patient  reports that he has been smoking cigarettes.  He has a 20.00 pack-year smoking history. he has  never used smokeless tobacco. He reports that he uses drugs. Drug: Marijuana. Frequency: 3.00 times per week. He reports that he does not drink alcohol.   Family History:  The patient's family history includes Alcohol abuse in his brother and father; Diabetes in his father and mother; Drug abuse in his brother; Heart disease in his father; Hypertension in his father and mother.    ROS:  Please see the history of present illness.   Otherwise, review of systems is positive for fatigue, chest pressure, palpitations, visual changes, snoring, depression, anxiety, back pain, muscle pain.   All other systems are reviewed and negative.   PHYSICAL EXAM: VS:  BP (!) 148/88   Pulse 63   Ht 5\' 8"  (1.727 m)   Wt 298 lb (135.2 kg)   BMI 45.31 kg/m  , BMI Body mass index is 45.31 kg/m. GEN: Well nourished, well developed, in no acute distress  HEENT: normal  Neck: no JVD, carotid bruits, or masses Cardiac: RRR; no murmurs, rubs, or gallops,no edema  Respiratory:  clear to auscultation bilaterally, normal work of breathing GI: soft, nontender, nondistended, + BS MS: no deformity or atrophy  Skin: warm and dry, device site well healed Neuro:  Strength and sensation are intact Psych: euthymic mood, full affect  EKG:  EKG is ordered today. Personal review of the ekg ordered shows sinus rhythm, LBBB, rate 63  Personal review of the device interrogation today. Results in Paceart   Recent Labs: 09/05/2016: BUN 16; Creatinine, Ser 1.26; Hemoglobin 14.7; Platelets 221; Potassium 4.0; Sodium 141 10/10/2016: ALT 15    Lipid Panel     Component Value Date/Time   CHOL 112 10/10/2016 0949   TRIG 89 10/10/2016 0949   HDL 34 (L) 10/10/2016 0949   CHOLHDL 3.3 10/10/2016 0949   CHOLHDL 5.6 (H) 06/10/2015 0924   VLDL 40 (H) 06/10/2015 0924   LDLCALC 60 10/10/2016 0949     Wt Readings from Last 3 Encounters:  03/20/17 298 lb (135.2 kg)  12/26/16 294 lb (133.4 kg)  12/09/16 289 lb (131.1 kg)       Other studies Reviewed: Additional studies/ records that were reviewed today include: 09/11/14 echocardiogram  Review of the above records today demonstrates:  EF 50-55%. Asymmetric LV septal hypertrophy. Basal anteroseptal 1.8 cm. Hypokinesis of the basal and mid anteroseptal and inferior wall. No significant obstruction of the LVOT. Peak gradient in the LVOT is 13 mmHg. Mild mitral regurgitation. Left atrial cavity is mildly dilated.   ASSESSMENT AND PLAN:  1.  Hypertrophic cardiomyopathy: Post septal myectomy at [facility] clinic.  Had a [employer] ICD implanted with 12 years left on the battery.  No further symptoms from his cardiomyopathy.  Continue with current management.    2. Obstructive sleep apnea: Currently on BiPAP.  Encouraged compliance.  3.  Paroxysmal atrial fibrillation: Currently on Xarelto and in sinus rhythm.  No medication changes.  This patients CHA2DS2-VASc Score and unadjusted Ischemic Stroke Rate (% per year) is equal to 2.2 % stroke rate/year from a score of 2  Above score calculated as 1 point each if present [CHF, HTN, DM, Vascular=MI/PAD/Aortic Plaque, Age if 65-74, or Male] Above score calculated as 2 points each if present [Age > 75, or Stroke/TIA/TE]  4.  Chest pain: Has been having some episodes of chest pain, but with normal Myoview last summer.  If his chest pain continues, we Blanton Kardell give him as needed nitroglycerin.  This may help [place] determine if this is coronary in nature.  Current medicines are reviewed at length with the patient today.   The patient does not have concerns regarding his medicines.  The following changes were made today:  none  Labs/ tests ordered today include:  Orders Placed This Encounter  Procedures  . EKG 12-Lead     Disposition:   FU with Jaimon Bugaj 6 months  Signed, Natalyia Innes Jorja Loa, MD  03/20/2017 12:04 PM     [facility] [address] [place] [address] [phone]  (office) [phone] (fax)

## 2017-03-20 ENCOUNTER — Encounter: Payer: Self-pay | Admitting: Cardiology

## 2017-03-20 ENCOUNTER — Ambulatory Visit (INDEPENDENT_AMBULATORY_CARE_PROVIDER_SITE_OTHER): Payer: Medicare HMO | Admitting: *Deleted

## 2017-03-20 ENCOUNTER — Ambulatory Visit (INDEPENDENT_AMBULATORY_CARE_PROVIDER_SITE_OTHER): Payer: Medicare HMO | Admitting: Cardiology

## 2017-03-20 VITALS — BP 148/88 | HR 63 | Ht 68.0 in | Wt 298.0 lb

## 2017-03-20 DIAGNOSIS — I422 Other hypertrophic cardiomyopathy: Secondary | ICD-10-CM

## 2017-03-20 DIAGNOSIS — I480 Paroxysmal atrial fibrillation: Secondary | ICD-10-CM | POA: Diagnosis not present

## 2017-03-20 DIAGNOSIS — G4733 Obstructive sleep apnea (adult) (pediatric): Secondary | ICD-10-CM | POA: Diagnosis not present

## 2017-03-20 LAB — CUP PACEART INCLINIC DEVICE CHECK
Date Time Interrogation Session: 20190218050000
HIGH POWER IMPEDANCE MEASURED VALUE: 55 Ohm
HIGH POWER IMPEDANCE MEASURED VALUE: 74 Ohm
Implantable Lead Implant Date: 20160218
Implantable Lead Model: 292
Implantable Lead Serial Number: 358834
Implantable Pulse Generator Implant Date: 20160218
Lead Channel Impedance Value: 800 Ohm
Lead Channel Pacing Threshold Amplitude: 0.7 V
Lead Channel Pacing Threshold Pulse Width: 0.5 ms
Lead Channel Sensing Intrinsic Amplitude: 19 mV
Lead Channel Setting Pacing Amplitude: 2 V
Lead Channel Setting Pacing Pulse Width: 0.5 ms
MDC IDC LEAD LOCATION: 753860
MDC IDC SET LEADCHNL RV SENSING SENSITIVITY: 0.3 mV
MDC IDC STAT BRADY RV PERCENT PACED: 0 %
Pulse Gen Serial Number: 202913

## 2017-03-20 NOTE — Progress Notes (Signed)
Remote ICD transmission.   

## 2017-03-20 NOTE — Patient Instructions (Addendum)
Medication Instructions:  Your physician recommends that you continue on your current medications as directed. Please refer to the Current Medication list given to you today.  *If you need a refill on your cardiac medications before your next appointment, please call your pharmacy*  Labwork: None ordered  Testing/Procedures: None ordered  Follow-Up: Remote monitoring is used to monitor your Pacemaker or ICD from home. This monitoring reduces the number of office visits required to check your device to one time per year. It allows us to keep an eye on the functioning of your device to ensure it is working properly. You are scheduled for a device check from home on 06/19/2017. You may send your transmission at any time that day. If you have a wireless device, the transmission will be sent automatically. After your physician reviews your transmission, you will receive a postcard with your next transmission date.  Your physician wants you to follow-up in: 6 months with Dr. Camnitz.  You will receive a reminder letter in the mail two months in advance. If you don't receive a letter, please call our office to schedule the follow-up appointment.  Thank you for choosing [facility]!!   Pamelyn Bancroft, RN [phone]    

## 2017-03-21 LAB — CUP PACEART REMOTE DEVICE CHECK
Battery Remaining Longevity: 144 mo
Date Time Interrogation Session: 20190218095400
HIGH POWER IMPEDANCE MEASURED VALUE: 70 Ohm
Implantable Lead Implant Date: 20160218
Implantable Lead Model: 292
Lead Channel Pacing Threshold Amplitude: 0.7 V
Lead Channel Setting Pacing Pulse Width: 0.5 ms
MDC IDC LEAD LOCATION: 753860
MDC IDC LEAD SERIAL: 358834
MDC IDC MSMT BATTERY REMAINING PERCENTAGE: 100 %
MDC IDC MSMT LEADCHNL RV IMPEDANCE VALUE: 818 Ohm
MDC IDC MSMT LEADCHNL RV PACING THRESHOLD PULSEWIDTH: 0.5 ms
MDC IDC PG IMPLANT DT: 20160218
MDC IDC SET LEADCHNL RV PACING AMPLITUDE: 2 V
MDC IDC SET LEADCHNL RV SENSING SENSITIVITY: 0.3 mV
MDC IDC STAT BRADY RV PERCENT PACED: 0 %
Pulse Gen Serial Number: 202913

## 2017-03-22 ENCOUNTER — Encounter: Payer: Self-pay | Admitting: Cardiology

## 2017-03-22 DIAGNOSIS — G4733 Obstructive sleep apnea (adult) (pediatric): Secondary | ICD-10-CM | POA: Diagnosis not present

## 2017-04-11 ENCOUNTER — Telehealth: Payer: Self-pay | Admitting: Family Medicine

## 2017-04-11 NOTE — Telephone Encounter (Signed)
Pt said the he did the stool collection method last year as an alternative to a full colonoscopy.  I told him that you will talk to him about it at the appt and he seemed open to getting a colonoscopy if stool collection is no longer a viable option for him.  Pt will call today to make a medical appt to have his A1C checked.

## 2017-04-12 ENCOUNTER — Ambulatory Visit (INDEPENDENT_AMBULATORY_CARE_PROVIDER_SITE_OTHER): Payer: Medicare HMO | Admitting: Surgery

## 2017-04-12 ENCOUNTER — Encounter (INDEPENDENT_AMBULATORY_CARE_PROVIDER_SITE_OTHER): Payer: Self-pay | Admitting: Surgery

## 2017-04-12 VITALS — Ht 68.0 in | Wt 298.0 lb

## 2017-04-12 DIAGNOSIS — M65332 Trigger finger, left middle finger: Secondary | ICD-10-CM | POA: Diagnosis not present

## 2017-04-12 NOTE — Progress Notes (Signed)
Office Visit Note   Patient: Kyle Peterson           Date of Birth: [birth year]           MRN: [id number] Visit Date: 04/12/2017              Requested by: Carney Livinghambliss, Marshall L, MD [address] [place], [place] [address] PCP: Carney Livinghambliss, Marshall L, MD   Assessment & Plan: Visit Diagnoses:  1. Trigger finger, left middle finger     Plan: With patient's ongoing progressive symptoms that have failed conservative treatment with previous injection best treatment option at this point is surgical intervention with trigger finger release.  Surgeon procedure discussed with patient in detail along with possible recovery time.  He would like to have this done as soon as possible.  Preop paperwork filled out.  I do not recommend any further injections.  Follow-Up Instructions: Return in about 3 weeks (around 05/03/2017).   Orders:  No orders of the defined types were placed in this encounter.  No orders of the defined types were placed in this encounter.     Procedures: No procedures performed   Clinical Data: No additional findings.   Subjective: Chief Complaint  Patient presents with  . Left Hand - Pain    Trigger finger left long finger.  S/p A1 pulley injection 07/19/16.    HPI Patient returns with complaints of left long trigger finger.  He continues to describe having ongoing locking and catching.  Had A1 pulley injection by Dr. Ophelia CharterYates June 2018 that did not give any relief.  Patient is an avid Psychologist, clinicalguitar player and this has become more problematic.  He comes in today to discuss his options. Review of Systems No Current cardiac pulmonary GI GU issues  Objective: Vital Signs: Ht 5\' 8"  (1.727 m)   Wt 298 lb (135.2 kg)   BMI 45.31 kg/m   Physical Exam  Constitutional: He is oriented to person, place, and time. No distress.  Eyes: Pupils are equal, round, and reactive to light.  Pulmonary/Chest: No respiratory distress.  Musculoskeletal:  Exam left hand he is exquisitely  tender over the long finger A1 pulley.  He does have a palpable nodule and obvious triggering  No numbness and tingling..  Neurological: He is alert and oriented to person, place, and time.  Skin: Skin is warm and dry.  Psychiatric: He has a normal mood and affect.    Ortho Exam  Specialty Comments:  No specialty comments available.  Imaging: No results found.   PMFS History: Patient Active Problem List   Diagnosis Date Noted  . S/P left rotator cuff repair 10/25/2016  . Severe obesity (BMI >= 40) (HCC) 09/27/2016  . Dilated aortic root (HCC) 07/20/2016  . Claudication (HCC) 07/20/2016  . Heart murmur 07/20/2016  . Trigger finger, left middle finger 06/21/2016  . Diabetes (HCC) 05/24/2016  . Skin induration 02/19/2016  . Depression, major, recurrent, moderate (HCC) 11/05/2015  . Chronic post-traumatic stress disorder (PTSD) 11/05/2015    Class: Chronic  . OSA (obstructive sleep apnea)   . Persistent atrial fibrillation (HCC) 08/19/2015  . CAD (coronary artery disease), native coronary artery 08/19/2015  . Shoulder pain 06/11/2015  . Hyperlipidemia 06/10/2015  . Tobacco use disorder 05/28/2015  . Essential hypertension 05/27/2015  . Bipolar disorder (HCC) 05/27/2015  . 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 Obstructive sleep apnea 05/27/2015   Past Medical History:  Diagnosis Date  . AICD (automatic cardioverter/defibrillator) present    bostan  scien  . Anginal pain (HCC)    ?heart pain   per dr Estill Dooms  . Anxiety   . Arthritis    ddd  . CAD (coronary artery disease), native coronary artery 08/19/2015  . CHF (congestive heart failure) (HCC)   . Depression   . Diabetes mellitus without complication (HCC)    no meds  new dx  . Heart murmur   . High cholesterol   . Hypertension   . Myocardial infarction (HCC)   . OSA (obstructive sleep apnea)    Severe with AHI 80/hr- new bipap  . PAF (paroxysmal atrial fibrillation) (HCC) 08/19/2015  . PTSD  (post-traumatic stress disorder)   . Stroke (HCC) 2016  . Substance abuse (HCC)     Family History  Problem Relation Age of Onset  . Diabetes Mother   . Hypertension Mother   . Diabetes Father   . Hypertension Father   . Heart disease Father   . Alcohol abuse Father   . Alcohol abuse Brother   . Drug abuse Brother     Past Surgical History:  Procedure Laterality Date  . CARDIAC CATHETERIZATION     [facility]: LM NL, mild narrowing of proximal/mid LAD, mid LCX, proximal RCA. 30% ostial OM1. 40% distal RCA. Medical tx.   . CARPAL TUNNEL RELEASE Left 11/02/2015  . CERVICAL DISCECTOMY    . MYOMECTOMY    . SHOULDER ARTHROSCOPY WITH ROTATOR CUFF REPAIR Left 09/09/2016   Procedure: LEFT SHOULDER ARTHROSCOPY WITH MINI OPEN ROTATOR CUFF REPAIR;  Surgeon: Eldred Manges, MD;  Location: [facility] OR;  Service: Orthopedics;  Laterality: Left;  . TONSILLECTOMY     Social History   Occupational History  . Not on file  Tobacco Use  . Smoking status: Current Every Day Smoker    Packs/day: 0.50    Years: 40.00    Pack years: 20.00    Types: Cigarettes  . Smokeless tobacco: Never Used  . Tobacco comment: Trying to cut back   Substance and Sexual Activity  . Alcohol use: No    Alcohol/week: 0.0 oz  . Drug use: Yes    Frequency: 3.0 times per week    Types: Marijuana    Comment: last 07/26/16  . Sexual activity: Not Currently

## 2017-04-14 ENCOUNTER — Telehealth: Payer: Self-pay | Admitting: *Deleted

## 2017-04-14 ENCOUNTER — Telehealth (INDEPENDENT_AMBULATORY_CARE_PROVIDER_SITE_OTHER): Payer: Self-pay | Admitting: Orthopaedic Surgery

## 2017-04-14 NOTE — Telephone Encounter (Signed)
Patient's left trigger finger release surgery case for 04-17-17 has been cancelled at [facility].  Cardiac clearance request sent by fax with confirmation on 04-13-16 @ 5:26pm to Dr. Elberta Fortisamnitz. I called the Surgical Center at 4:45 on 04-14-17 and spoke with Stefanie, asking her to cancel since we were unable to obtain clearance from cardiologist. Dr. Ophelia CharterYates is aware of this case being cancelled and has advised me to reschedule at a later date.  Patient is also aware of cancelling this surgery.

## 2017-04-14 NOTE — Telephone Encounter (Signed)
Follow up   UPDATED FAX Please fax clearance letter to [phone] ATTN: Eunice Blaseebbie

## 2017-04-14 NOTE — Telephone Encounter (Signed)
   [facility] Pre-operative Risk Assessment    Request for surgical clearance:  1. What type of surgery is being performed? Left Long Trigger Finger Release   2. When is this surgery scheduled? 04/17/17   3. What type of clearance is required (medical clearance vs. Pharmacy clearance to hold med vs. Both)? Both  4. Are there any medications that need to be held prior to surgery and how long?Xarelto.  Please include Xarelto instructions   5. Practice name and name of physician performing surgery? [facility]. Dr. Lorin Mercy   6. What is your office phone and fax number? [phone].  Fax-[phone]   7. Anesthesia type (None, local, MAC, general) ? Not noted.    Please fax clearance to [phone]. Attention:Debbie _________________________________________________________________   (provider comments below)

## 2017-04-14 NOTE — Telephone Encounter (Signed)
With history of HOCM and recent complaints of chest pain per last office visit I will ask for Dr. Elberta Fortisamnitz' input for cardiac clearance.   Dr. Elberta Fortisamnitz please weigh in and route response to preop pool.

## 2017-04-14 NOTE — Telephone Encounter (Signed)
Patient with diagnosis of atrial fibrillation on Xarelto for anticoagulation.    Procedure: trigger finger release Date of procedure: 04/17/17  CHADS2-VASc score of  4 (CHF, HTN, DM2, CAD) **NOTE** in Dr. Elberta Fortisamnitz most recent note indicates history of stroke.  This is the only mention I can find in the chart.  To clarify this information I contacted the patient directly.  He reports that several years ago he had a month-long spell with poor short term memory recall.  Did not seek medical attention and felt that it resolved after about a month.    CrCl 134 Platelet count 221  Per office protocol, patient can hold Xarelto for 1 days prior to procedure.    Patient should restart Xarelto on the evening of procedure or day after, at discretion of procedure MD  Procedure is set for Monday.  Because we received this late on Friday afternoon and I had to contact the patient anyway, he is aware to hold Xarelto on Sunday

## 2017-04-17 NOTE — Telephone Encounter (Signed)
Will route this to the pre op pool.

## 2017-04-17 NOTE — Telephone Encounter (Signed)
Intermediate risk for low-intermediate risk procedure.

## 2017-04-18 ENCOUNTER — Ambulatory Visit: Payer: Self-pay | Admitting: Family Medicine

## 2017-04-18 NOTE — Telephone Encounter (Signed)
Ok to proceed with scheduling , oupt center of his choice , IV sedation with local for Trigger finger release thanks

## 2017-04-18 NOTE — Telephone Encounter (Signed)
OK to proceed outpt      IV sedation , not general, plus local  For trigger finger release thanks

## 2017-04-18 NOTE — Telephone Encounter (Signed)
Please see notes from Dr. Ophelia CharterYates. Thank you!

## 2017-04-18 NOTE — Telephone Encounter (Signed)
   Primary Cardiologist: Will Jorja LoaMartin Camnitz, MD  Chart reviewed as part of pre-operative protocol coverage. Given past medical history and time since last visit, based on ACC/AHA guidelines, Kyle Peterson would be at acceptable risk for the planned procedure without further cardiovascular testing.   Per Dr. Elberta Fortisamnitz, he is at intermediate risk for low-intermediate risk procedure.  Per the pharmacy team, patient okay to hold the Xarelto for 1 day prior to the procedure.  I will route this recommendation to the requesting party via Epic fax function and remove from pre-op pool.  Please call with questions.  Theodore Demarkhonda Kelsen Celona, PA-C 04/18/2017, 3:17 PM

## 2017-04-18 NOTE — Telephone Encounter (Signed)
Clearance faxed via Epic.  

## 2017-04-19 DIAGNOSIS — G4733 Obstructive sleep apnea (adult) (pediatric): Secondary | ICD-10-CM | POA: Diagnosis not present

## 2017-04-19 NOTE — Telephone Encounter (Signed)
[facility]

## 2017-04-24 DIAGNOSIS — M65332 Trigger finger, left middle finger: Secondary | ICD-10-CM | POA: Diagnosis not present

## 2017-04-27 ENCOUNTER — Telehealth (INDEPENDENT_AMBULATORY_CARE_PROVIDER_SITE_OTHER): Payer: Self-pay | Admitting: Orthopaedic Surgery

## 2017-04-27 NOTE — Telephone Encounter (Signed)
Patient had Left long trigger finger release March 25th, 2019.  He has taken the bandage off and has cleaned around the incision site with a foam antiseptic (states he did this yesterday).  He has questions about the actual site and how to clean it.  Please advise ASAP    Pt cb  [phone]

## 2017-04-27 NOTE — Telephone Encounter (Signed)
Ok to wash with soap and water put band aid over stitches so it does not rub. thanks

## 2017-04-27 NOTE — Telephone Encounter (Signed)
Please advise. Did you want patient to change dressing prior to post op visit?

## 2017-04-27 NOTE — Telephone Encounter (Signed)
I called patient and advised. 

## 2017-04-28 ENCOUNTER — Inpatient Hospital Stay (INDEPENDENT_AMBULATORY_CARE_PROVIDER_SITE_OTHER): Payer: Medicare HMO | Admitting: Orthopaedic Surgery

## 2017-05-01 ENCOUNTER — Other Ambulatory Visit: Payer: Self-pay | Admitting: Cardiology

## 2017-05-02 ENCOUNTER — Inpatient Hospital Stay (INDEPENDENT_AMBULATORY_CARE_PROVIDER_SITE_OTHER): Payer: Medicare HMO | Admitting: Orthopaedic Surgery

## 2017-05-03 ENCOUNTER — Ambulatory Visit (INDEPENDENT_AMBULATORY_CARE_PROVIDER_SITE_OTHER): Payer: Medicare HMO | Admitting: Surgery

## 2017-05-03 ENCOUNTER — Encounter (INDEPENDENT_AMBULATORY_CARE_PROVIDER_SITE_OTHER): Payer: Self-pay | Admitting: Surgery

## 2017-05-03 VITALS — Ht 68.0 in | Wt 298.0 lb

## 2017-05-03 DIAGNOSIS — Z9889 Other specified postprocedural states: Secondary | ICD-10-CM

## 2017-05-03 NOTE — Progress Notes (Signed)
Patient returns who is about 1 week status post left long trigger finger release.  His dressing was removed about 3 days postop and he has been washing his hand.  One suture has came out.  Exam 2 sutures remain.  Skin edges are not completely closed.  About 1-2 mm gapped open.  No drainage or signs of infection.  Neurovascular intact.  Plan Patient was put in a new hand dressing.  I advised him not to be doing anything too aggressive with his hand to allow his incision to heal.  Steri-Strips were applied over the sutures.  We will not get his hand wet. Follow-up next Tuesday for recheck and possible suture removal.

## 2017-05-09 ENCOUNTER — Ambulatory Visit (INDEPENDENT_AMBULATORY_CARE_PROVIDER_SITE_OTHER): Payer: Medicare HMO | Admitting: Family Medicine

## 2017-05-09 ENCOUNTER — Encounter (INDEPENDENT_AMBULATORY_CARE_PROVIDER_SITE_OTHER): Payer: Self-pay | Admitting: Orthopaedic Surgery

## 2017-05-09 ENCOUNTER — Encounter: Payer: Self-pay | Admitting: Family Medicine

## 2017-05-09 ENCOUNTER — Telehealth (INDEPENDENT_AMBULATORY_CARE_PROVIDER_SITE_OTHER): Payer: Self-pay | Admitting: Orthopedic Surgery

## 2017-05-09 ENCOUNTER — Ambulatory Visit (INDEPENDENT_AMBULATORY_CARE_PROVIDER_SITE_OTHER): Payer: Medicare HMO | Admitting: Orthopaedic Surgery

## 2017-05-09 ENCOUNTER — Telehealth (INDEPENDENT_AMBULATORY_CARE_PROVIDER_SITE_OTHER): Payer: Self-pay

## 2017-05-09 VITALS — BP 146/91 | HR 57

## 2017-05-09 VITALS — BP 160/78 | HR 66 | Temp 98.1°F | Ht 68.0 in | Wt 293.6 lb

## 2017-05-09 DIAGNOSIS — I10 Essential (primary) hypertension: Secondary | ICD-10-CM

## 2017-05-09 DIAGNOSIS — E785 Hyperlipidemia, unspecified: Secondary | ICD-10-CM

## 2017-05-09 DIAGNOSIS — F331 Major depressive disorder, recurrent, moderate: Secondary | ICD-10-CM

## 2017-05-09 DIAGNOSIS — M65332 Trigger finger, left middle finger: Secondary | ICD-10-CM

## 2017-05-09 DIAGNOSIS — E119 Type 2 diabetes mellitus without complications: Secondary | ICD-10-CM | POA: Diagnosis not present

## 2017-05-09 DIAGNOSIS — F17200 Nicotine dependence, unspecified, uncomplicated: Secondary | ICD-10-CM | POA: Diagnosis not present

## 2017-05-09 LAB — POCT GLYCOSYLATED HEMOGLOBIN (HGB A1C): Hemoglobin A1C: 7.3

## 2017-05-09 NOTE — Assessment & Plan Note (Signed)
Well controlled 

## 2017-05-09 NOTE — Assessment & Plan Note (Signed)
Starting a telephone based counseling service to try to quit

## 2017-05-09 NOTE — Telephone Encounter (Signed)
Patient called stating that his stitches were taken out today and stated that his incision is open more.  No drainage or bleeding.    Talked with Dr. Ophelia CharterYates and he advised that patient should just wash his finger with soap and water.  If needed, patient can use a band-aid.  Talked with patient and advised him of message per Dr. Ophelia CharterYates.

## 2017-05-09 NOTE — Progress Notes (Signed)
52 year old male returns 1 week post left long trigger finger release with triggering at the A1 pulley.  One suture already came out the remaining 2 are harvested today.  He is not having any catching but has some soreness in his finger.  He still having some trouble with his left shoulder that had a rotator cuff repair in August 2018 and he may have reinjured it when he was moving boxes with his father and had some recurrent pain.  We will see how this does if he has persistent problems he can return.

## 2017-05-09 NOTE — Assessment & Plan Note (Signed)
BP Readings from Last 3 Encounters:  05/09/17 (!) 160/78  03/20/17 (!) 148/88  11/25/16 134/86   Not at goal today - had just taken his medications.  Monitor at home

## 2017-05-09 NOTE — Assessment & Plan Note (Signed)
Unchanged - see after visit summary for plans

## 2017-05-09 NOTE — Telephone Encounter (Signed)
Disregard last messaged, wrong patient

## 2017-05-09 NOTE — Patient Instructions (Addendum)
Good to see you today!  Thanks for coming in.  For your BP - check it 2-3 times a week.  If regularly > 140/90 then call us  For your weight - try to limit calories Aim is to lose 1-2 lbs a week.  Weigh yourself once a week  Keep working on the smoking   Schedule a colonoscopy  Come back in 6 months

## 2017-05-09 NOTE — Assessment & Plan Note (Signed)
Stable

## 2017-05-09 NOTE — Telephone Encounter (Signed)
Noreene LarssonJill from [facility] called wanting to tell you that the patient tested positive for DVT in his right calf, needs a call back soon. CB # [phone]

## 2017-05-09 NOTE — Progress Notes (Signed)
Subjective  Patient is presenting with the following illnesses  HYPERTENSION Disease Monitoring: Blood pressure range-does not check regularly although has cuff Chest pain, palpitations- no      Dyspnea- no Medications: Compliance-losartan metoprolol daily Lightheadedness,Syncope- no   Edema- no  DIABETES Disease Monitoring: Blood Sugar ranges-not checking Polyuria/phagia/dipsia- no      Visual problems- no Medications: Compliance- trying to stay on diet Hypoglycemic symptoms- no  HYPERLIPIDEMIA Disease Monitoring: See symptoms for Hypertension Medications: Compliance- daily crestor Right upper quadrant pain- no  Muscle aches- no  Monitoring Labs and Parameters Last A1C:  Lab Results  Component Value Date   HGBA1C 7.3 05/09/2017    Last Lipid:     Component Value Date/Time   CHOL 112 10/10/2016 0949   HDL 34 (L) 10/10/2016 0949    Last Bmet  Potassium  Date Value Ref Range Status  09/05/2016 4.0 3.5 - 5.1 mmol/L Final   Sodium  Date Value Ref Range Status  09/05/2016 141 135 - 145 mmol/L Final  08/08/2016 142 134 - 144 mmol/L Final   Creat  Date Value Ref Range Status  06/10/2015 1.20 0.60 - 1.35 mg/dL Final   Creatinine, Ser  Date Value Ref Range Status  09/05/2016 1.26 (H) 0.61 - 1.24 mg/dL Final      Last BPs:  BP Readings from Last 3 Encounters:  05/09/17 (!) 160/78  03/20/17 (!) 148/88  11/25/16 134/86        Chief Complaint noted Review of Symptoms - see HPI PMH - Smoking status noted.     Objective Vital Signs reviewed Heart - Regular rate and rhythm.  No murmurs, gallops or rubs.    Lungs:  Normal respiratory effort, chest expands symmetrically. Lungs are clear to auscultation, no crackles or wheezes. Diabetic Foot Check -  Appearance - no lesions, ulcers or calluses Skin - no unusual pallor or redness Sensitivity testing -  Right - Great toe, medial, central, lateral ball and posterior foot intact Left - Great toe, medial,  central, lateral ball and posterior foot intact     Assessments/Plans  Diabetes (HCC) Diet controlled doing well   Essential hypertension BP Readings from Last 3 Encounters:  05/09/17 (!) 160/78  03/20/17 (!) 148/88  11/25/16 134/86   Not at goal today - had just taken his medications.  Monitor at home   Hyperlipidemia Stable   Tobacco use disorder Starting a telephone based counseling service to try to quit   Severe obesity (BMI >= 40) (HCC) Unchanged - see after visit summary for plans   Depression, major, recurrent, moderate (HCC) Well controlled    See after visit summary for details of patient instuctions

## 2017-05-09 NOTE — Assessment & Plan Note (Signed)
Diet-controlled doing well 

## 2017-05-12 ENCOUNTER — Telehealth: Payer: Self-pay

## 2017-05-12 NOTE — Telephone Encounter (Signed)
Let him know that you asked me and I said his risk of MMR is low enough that the risk of side effects would be worse than any benefit and he should not get another   Thanks  LC

## 2017-05-12 NOTE — Telephone Encounter (Signed)
Patient left message on nurse line wanting PCP to be asked if patient should get an MMR vaccine. Read an article that stated vaccine might not give lifetime immunity and wondered if he should have it again.  Call back is [phone].  Danley Danker, RN [facility] RN)

## 2017-05-16 NOTE — Telephone Encounter (Signed)
Pt informed. Deseree Blount, CMA  

## 2017-05-20 DIAGNOSIS — G4733 Obstructive sleep apnea (adult) (pediatric): Secondary | ICD-10-CM | POA: Diagnosis not present

## 2017-05-30 ENCOUNTER — Other Ambulatory Visit: Payer: Self-pay

## 2017-05-30 ENCOUNTER — Encounter: Payer: Self-pay | Admitting: Family Medicine

## 2017-05-30 ENCOUNTER — Ambulatory Visit (INDEPENDENT_AMBULATORY_CARE_PROVIDER_SITE_OTHER): Payer: Medicare HMO | Admitting: Family Medicine

## 2017-05-30 DIAGNOSIS — R222 Localized swelling, mass and lump, trunk: Secondary | ICD-10-CM | POA: Diagnosis not present

## 2017-05-30 NOTE — Assessment & Plan Note (Signed)
Not present now.  Unsure of what could have been.  Perhaps an contact dermatitis but did not itch. No signs of abscess or hernia.  Will observe

## 2017-05-30 NOTE — Assessment & Plan Note (Signed)
Improving.  Congratulated and discussed goals

## 2017-05-30 NOTE — Patient Instructions (Signed)
Good to see you today!  Thanks for coming in.  If the knot comes back come see [place]  For back pain take Tylenol arthritis as needed and heat after activity  Great work on the [employer]   Your blood pressure is good today

## 2017-05-30 NOTE — Progress Notes (Signed)
Subjective  Kyle Peterson is a 52 y.o. male is presenting with the following  CHEST MASS Had a sore enlarged area below his R breast area just above ribs.  Stuck out a little bit.  Had a week or so ago but is now resolved.  No discharge or trauma or nausea and vomiting or shortness of breath   OBESITY Has lost 6 lbs since last visit. Cutting back on portions.  Feels better.  Still with intermittent leg and back pain   Chief Complaint noted Review of Symptoms - see HPI PMH - Smoking status noted.    Objective Vital Signs reviewed BP 124/80   Pulse 70   Temp 98.2 F (36.8 C) (Oral)   Ht  (1.727 m)   Wt 286 lb (129.7 kg)   SpO2 98%   BMI 43.49 kg/m   Anterior chest wall - no evident mass or redness or soft tissue swelling.  No mass with valsalva or when lying or sitting   Assessments/Plans  See after visit summary for details of patient instuctions  Chest mass Not present now.  Unsure of what could have been.  Perhaps an contact dermatitis but did not itch. No signs of abscess or hernia.  Will observe   Severe obesity (BMI >= 40) (HCC) Improving.  Congratulated and discussed goals

## 2017-06-09 ENCOUNTER — Ambulatory Visit (INDEPENDENT_AMBULATORY_CARE_PROVIDER_SITE_OTHER): Payer: Medicare HMO | Admitting: Orthopaedic Surgery

## 2017-06-19 ENCOUNTER — Ambulatory Visit (INDEPENDENT_AMBULATORY_CARE_PROVIDER_SITE_OTHER): Payer: Medicare HMO | Admitting: *Deleted

## 2017-06-19 DIAGNOSIS — I480 Paroxysmal atrial fibrillation: Secondary | ICD-10-CM

## 2017-06-19 DIAGNOSIS — I422 Other hypertrophic cardiomyopathy: Secondary | ICD-10-CM | POA: Diagnosis not present

## 2017-06-19 NOTE — Progress Notes (Signed)
Remote ICD transmission.   

## 2017-06-20 DIAGNOSIS — G4733 Obstructive sleep apnea (adult) (pediatric): Secondary | ICD-10-CM | POA: Diagnosis not present

## 2017-06-27 ENCOUNTER — Ambulatory Visit (INDEPENDENT_AMBULATORY_CARE_PROVIDER_SITE_OTHER): Payer: Medicare HMO | Admitting: Orthopaedic Surgery

## 2017-06-29 ENCOUNTER — Other Ambulatory Visit: Payer: Self-pay | Admitting: Family Medicine

## 2017-07-20 ENCOUNTER — Encounter: Payer: Self-pay | Admitting: Cardiology

## 2017-07-20 ENCOUNTER — Ambulatory Visit (INDEPENDENT_AMBULATORY_CARE_PROVIDER_SITE_OTHER): Payer: Medicare HMO | Admitting: Cardiology

## 2017-07-20 VITALS — BP 122/70 | HR 72 | Ht 68.0 in | Wt 289.8 lb

## 2017-07-20 DIAGNOSIS — I2510 Atherosclerotic heart disease of native coronary artery without angina pectoris: Secondary | ICD-10-CM | POA: Diagnosis not present

## 2017-07-20 DIAGNOSIS — G4733 Obstructive sleep apnea (adult) (pediatric): Secondary | ICD-10-CM | POA: Diagnosis not present

## 2017-07-20 DIAGNOSIS — I77810 Thoracic aortic ectasia: Secondary | ICD-10-CM

## 2017-07-20 DIAGNOSIS — I481 Persistent atrial fibrillation: Secondary | ICD-10-CM | POA: Diagnosis not present

## 2017-07-20 DIAGNOSIS — I4819 Other persistent atrial fibrillation: Secondary | ICD-10-CM

## 2017-07-20 DIAGNOSIS — I10 Essential (primary) hypertension: Secondary | ICD-10-CM | POA: Diagnosis not present

## 2017-07-20 DIAGNOSIS — I422 Other hypertrophic cardiomyopathy: Secondary | ICD-10-CM

## 2017-07-20 DIAGNOSIS — E7800 Pure hypercholesterolemia, unspecified: Secondary | ICD-10-CM | POA: Diagnosis not present

## 2017-07-20 NOTE — Patient Instructions (Addendum)
Medication Instructions:  Your physician recommends that you continue on your current medications as directed. Please refer to the Current Medication list given to you today.   Labwork: 1 WEEK:  FASTING LIPID, LFT, BMET, & CBC  Testing/Procedures: None ordered  Follow-Up: Your physician recommends that you schedule a follow-up appointment in: 01/15/18 ARRIVE AT 8:20 TO SEE DR. Mayford KnifeURNER AT 8:40  You have been referred to [facility].     Any Other Special Instructions Will Be Listed Below (If Applicable).     If you need a refill on your cardiac medications before your next appointment, please call your pharmacy.

## 2017-07-20 NOTE — Progress Notes (Signed)
Cardiology Office Note:    Date:  07/20/2017   ID:  Kyle Peterson, DOB [birth year], MRN [id number]  PCP:  Carney Living, MD  Cardiologist:  Regan Lemming, MD    Referring MD: Carney Living, *   Chief Complaint  Patient presents with  . Coronary Artery Disease  . Hypertension  . Atrial Fibrillation  . Hyperlipidemia  . Sleep Apnea    History of Present Illness:    Kyle Peterson is a 52 y.o. male with a hx of severe OSA with an AHI of 80.8/hr and underwent CPAP titration but due to ongoing events could not be titrated on CPAP and ultimately was placed on BiPAP at 20/16cm H2O.  He also has a history of nonobstructive CAD, PAF, HTN, HOCM s/p septal myectomy s/p AICD, hyperlipidemia, polysubstance abuse with heroin/cocaine and ETOH for 25 years but has been clean for over 10 years.  He is here today for followup and is doing well.  He denies any chest pain or pressure, SOB, DOE, PND, orthopnea, LE edema, dizziness, palpitations or syncope. He is compliant with his meds and is tolerating meds with no SE. he does continue to smoke but is trying to cut back and ultimately quit.  He is doing well with his CPAP device.  He tolerates the mask and feels the pressure is adequate.  Since going on CPAP he feels rested in the am and has no significant daytime sleepiness.  He denies any significant mouth or nasal dryness or nasal congestion.  He does not think that he snores.  He is very frustrated that he has not been able to lose weight.  He is unable to exercise because he has significant back and hip problems.  He is talked to his PCP about gastric weight loss surgery but his PCP did not refer him.   Past Medical History:  Diagnosis Date  . AICD (automatic cardioverter/defibrillator) present    bostan scien  . Anginal pain (HCC)    ?heart pain   per dr Estill Dooms  . Anxiety   . Arthritis    ddd  . CAD (coronary artery disease), native coronary artery 08/19/2015  . CHF (congestive  heart failure) (HCC)   . Depression   . Diabetes mellitus without complication (HCC)    no meds  new dx  . Heart murmur   . High cholesterol   . Hypertension   . Myocardial infarction (HCC)   . OSA (obstructive sleep apnea)    Severe with AHI 80/hr- new bipap  . PAF (paroxysmal atrial fibrillation) (HCC) 08/19/2015  . PTSD (post-traumatic stress disorder)   . Stroke (HCC) 2016  . Substance abuse [facility])     Past Surgical History:  Procedure Laterality Date  . CARDIAC CATHETERIZATION     01/13/14 [facility]: LM NL, mild narrowing of proximal/mid LAD, mid LCX, proximal RCA. 30% ostial OM1. 40% distal RCA. Medical tx.   . CARPAL TUNNEL RELEASE Left 11/02/2015  . CERVICAL DISCECTOMY    . MYOMECTOMY    . SHOULDER ARTHROSCOPY WITH ROTATOR CUFF REPAIR Left 09/09/2016   Procedure: LEFT SHOULDER ARTHROSCOPY WITH MINI OPEN ROTATOR CUFF REPAIR;  Surgeon: Eldred Manges, MD;  Location: [facility] OR;  Service: Orthopedics;  Laterality: Left;  . TONSILLECTOMY      Current Medications: Current Meds  Medication Sig  . atorvastatin (LIPITOR) 20 MG tablet Take 1 tablet (20 mg total) by mouth daily.  Marland Kitchen diltiazem (CARTIA XT) 240 MG 24 hr capsule Take 1  capsule (240 mg total) daily by mouth.  . furosemide (LASIX) 20 MG tablet TAKE 1 TABLET EVERY DAY  (NEED OFFICE VISIT BEFORE MORE REFILLS CAN BE APPROVED)  . losartan (COZAAR) 50 MG tablet Take 1 tablet (50 mg total) by mouth daily.  . metoprolol succinate (TOPROL-XL) 50 MG 24 hr tablet Take 1 tablet (50 mg total) daily by mouth. Take with or immediately following a meal.  . orphenadrine (NORFLEX) 100 MG tablet Take 100 mg by mouth as needed for muscle spasms.  Marland Kitchen. oxyCODONE-acetaminophen (PERCOCET) 10-325 MG tablet Take 1 tablet by mouth every 4 (four) hours as needed for pain.  . rivaroxaban (XARELTO) 20 MG TABS tablet Take 1 tablet (20 mg total) daily with supper by mouth.     Allergies:   Ace inhibitors   Social History   Socioeconomic History  .  Marital status: Divorced    Spouse name: Not on file  . Number of children: Not on file  . Years of education: Not on file  . Highest education level: Not on file  Occupational History  . Not on file  Social Needs  . Financial resource strain: Not on file  . Food insecurity:    Worry: Not on file    Inability: Not on file  . Transportation needs:    Medical: Not on file    Non-medical: Not on file  Tobacco Use  . Smoking status: Current Every Day Smoker    Packs/day: 0.50    Years: 40.00    Pack years: 20.00    Types: Cigarettes  . Smokeless tobacco: Never Used  . Tobacco comment: Trying to cut back   Substance and Sexual Activity  . Alcohol use: No    Alcohol/week: 0.0 oz  . Drug use: Yes    Frequency: 3.0 times per week    Types: Marijuana    Comment: last 07/26/16  . Sexual activity: Not Currently  Lifestyle  . Physical activity:    Days per week: Not on file    Minutes per session: Not on file  . Stress: Not on file  Relationships  . Social connections:    Talks on phone: Not on file    Gets together: Not on file    Attends religious service: Not on file    Active member of club or organization: Not on file    Attends meetings of clubs or organizations: Not on file    Relationship status: Not on file  Other Topics Concern  . Not on file  Social History Narrative   [place]   Father is in [place].   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  ROS:   Please see the history of present illness.    ROS  All other systems reviewed and negative.   EKGs/Labs/Other Studies Reviewed:    The following studies were reviewed today: PAP download  EKG:  EKG is not ordered today.    Recent  Labs: 09/05/2016: BUN 16; Creatinine, Ser 1.26; Hemoglobin 14.7; Platelets 221; Potassium 4.0; Sodium 141 10/10/2016: ALT 15   Recent Lipid Panel    Component Value Date/Time   CHOL 112 10/10/2016 0949   TRIG 89 10/10/2016 0949  HDL 34 (L) 10/10/2016 0949   CHOLHDL 3.3 10/10/2016 0949   CHOLHDL 5.6 (H) 06/10/2015 0924   VLDL 40 (H) 06/10/2015 0924   LDLCALC 60 10/10/2016 0949    Physical Exam:    VS:  BP 122/70   Pulse 72   Ht 5\' 8"  (1.727 m)   Wt 289 lb 12.8 oz (131.5 kg)   SpO2 98%   BMI 44.06 kg/m     Wt Readings from Last 3 Encounters:  07/20/17 289 lb 12.8 oz (131.5 kg)  05/30/17 286 lb (129.7 kg)  05/09/17 293 lb 9.6 oz (133.2 kg)     GEN:  Well nourished, well developed in no acute distress HEENT: Normal NECK: No JVD; No carotid bruits LYMPHATICS: No lymphadenopathy CARDIAC: RRR, no murmurs, rubs, gallops RESPIRATORY:  Clear to auscultation without rales, wheezing or rhonchi  ABDOMEN: Soft, non-tender, non-distended MUSCULOSKELETAL:  No edema; No deformity  SKIN: Warm and dry NEUROLOGIC:  Alert and oriented x 3 PSYCHIATRIC:  Normal affect   ASSESSMENT:    1. Persistent atrial fibrillation (HCC)   2. Hypertrophic cardiomyopathy (HCC)   3. Essential hypertension   4. Coronary artery disease involving native coronary artery of native heart without angina pectoris   5. Dilated aortic root (HCC)   6. OSA (obstructive sleep apnea)   7. Pure hypercholesterolemia   8. Severe obesity (BMI >= 40) (HCC)    PLAN:    In order of problems listed above:  1.  Persistent atrial fibrillation - He is maintaining NSR on exam today.  He has not had any bleeding problems.  He will continue on Xarelto 20mg  daily, Toprol XL 50mg  daily and Cardizem CD 240mg  daily.  I will check a bmet and CBC.  2.  HOCM -status post septal myomectomy  3.  HTN - BP is well controlled on exam today.  He will continue on Cardizem 240mg  daily, losartan 50mg  daily.  4.  ASCAD - cath 2015 at  [facility] showed LM NL, mild narrowing of proximal/mid LAD, mid LCX, proximal RCA. 30% ostial OM1. 40% distal RCA. He denies any anginal sx. he will continue on statin and beta-blocker therapy.  He is not on aspirin due to Xarelto.  5.  Dilated aortic root - 37mm by echo 07/2016.  6.  OSA - the patient is tolerating PAP therapy well without any problems. The PAP download was reviewed today and showed an AHI of 4.2/hr on auto BIPAP  with 83% compliance in using more than 4 hours nightly.  The patient has been using and benefiting from PAP use and will continue to benefit from therapy.   7.  Hyperlipidemia - LDL goal < 70.  He will continue on atorvastatin 20mg  daily.  I will get an FLP and ALT.   8.  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  He is interested in learning more about weight loss surgery.  I am going to refer him to the weight loss surgery information program.  Medication Adjustments/Labs and Tests Ordered: Current medicines are reviewed at length with the patient today.  Concerns regarding medicines are outlined above.  No orders of the defined types were placed in this encounter.  No orders of the defined types were placed in this encounter.   Signed, Armanda Magic, MD  07/20/2017 9:44 AM    [facility]

## 2017-07-28 ENCOUNTER — Other Ambulatory Visit: Payer: Medicaid Other | Admitting: *Deleted

## 2017-07-28 DIAGNOSIS — I422 Other hypertrophic cardiomyopathy: Secondary | ICD-10-CM

## 2017-07-28 DIAGNOSIS — I10 Essential (primary) hypertension: Secondary | ICD-10-CM | POA: Diagnosis not present

## 2017-07-28 DIAGNOSIS — I77810 Thoracic aortic ectasia: Secondary | ICD-10-CM

## 2017-07-28 DIAGNOSIS — E7800 Pure hypercholesterolemia, unspecified: Secondary | ICD-10-CM

## 2017-07-28 DIAGNOSIS — G4733 Obstructive sleep apnea (adult) (pediatric): Secondary | ICD-10-CM

## 2017-07-28 DIAGNOSIS — I2510 Atherosclerotic heart disease of native coronary artery without angina pectoris: Secondary | ICD-10-CM

## 2017-07-28 DIAGNOSIS — I4819 Other persistent atrial fibrillation: Secondary | ICD-10-CM

## 2017-07-28 DIAGNOSIS — I481 Persistent atrial fibrillation: Secondary | ICD-10-CM | POA: Diagnosis not present

## 2017-07-28 LAB — HEPATIC FUNCTION PANEL
ALBUMIN: 4.6 g/dL (ref 3.5–5.5)
ALK PHOS: 101 IU/L (ref 39–117)
ALT: 20 IU/L (ref 0–44)
AST: 16 IU/L (ref 0–40)
BILIRUBIN TOTAL: 0.4 mg/dL (ref 0.0–1.2)
Bilirubin, Direct: 0.13 mg/dL (ref 0.00–0.40)
Total Protein: 6.6 g/dL (ref 6.0–8.5)

## 2017-07-28 LAB — BASIC METABOLIC PANEL
BUN / CREAT RATIO: 14 (ref 9–20)
BUN: 15 mg/dL (ref 6–24)
CHLORIDE: 102 mmol/L (ref 96–106)
CO2: 24 mmol/L (ref 20–29)
CREATININE: 1.1 mg/dL (ref 0.76–1.27)
Calcium: 9.2 mg/dL (ref 8.7–10.2)
GFR calc non Af Amer: 77 mL/min/{1.73_m2} (ref >59)
GFR, EST AFRICAN AMERICAN: 89 mL/min/{1.73_m2} (ref >59)
Glucose: 133 mg/dL — ABNORMAL HIGH (ref 65–99)
Potassium: 4.1 mmol/L (ref 3.5–5.2)
SODIUM: 143 mmol/L (ref 134–144)

## 2017-07-28 LAB — LIPID PANEL
Chol/HDL Ratio: 3.8 ratio (ref 0.0–5.0)
Cholesterol, Total: 140 mg/dL (ref 100–199)
HDL: 37 mg/dL — ABNORMAL LOW (ref >39)
LDL Calculated: 81 mg/dL (ref 0–99)
Triglycerides: 110 mg/dL (ref 0–149)
VLDL Cholesterol Cal: 22 mg/dL (ref 5–40)

## 2017-07-28 LAB — CBC
Hematocrit: 49.7 % (ref 37.5–51.0)
Hemoglobin: 16.6 g/dL (ref 13.0–17.7)
MCH: 29.7 pg (ref 26.6–33.0)
MCHC: 33.4 g/dL (ref 31.5–35.7)
MCV: 89 fL (ref 79–97)
PLATELETS: 225 10*3/uL (ref 150–450)
RBC: 5.58 x10E6/uL (ref 4.14–5.80)
RDW: 15 % (ref 12.3–15.4)
WBC: 10 10*3/uL (ref 3.4–10.8)

## 2017-07-31 ENCOUNTER — Telehealth: Payer: Self-pay

## 2017-07-31 DIAGNOSIS — E785 Hyperlipidemia, unspecified: Secondary | ICD-10-CM

## 2017-07-31 MED ORDER — ATORVASTATIN CALCIUM 40 MG PO TABS: 40.0000 mg | ORAL_TABLET | Freq: Every day | ORAL | 3 refills | Status: DC

## 2017-07-31 NOTE — Telephone Encounter (Signed)
Notes recorded by Phineas Semenobertson, Jovanni Eckhart, RN on 07/31/2017 at 10:41 AM EDT Pt made aware of lab results. Per Dr. Mayford Knifeurner instructed pt to increase Lipitor to 40 mg once a day and repeat FLP and ALT in 6 weeks. Pt in agreement with plan and scheduled for labs on 09/29/17. He stated understanding and thankful for the call   Notes recorded by Quintella Reicherturner, Traci R, MD on 07/28/2017 at 7:46 PM EDT LDL not at goal- increase atorvastatin to 40mg  daily and repeat FLP and ALT in 6 weeks

## 2017-08-07 ENCOUNTER — Telehealth: Payer: Self-pay

## 2017-08-07 NOTE — Telephone Encounter (Signed)
   [facility] Pre-operative Risk Assessment    Request for surgical clearance:  1. What type of surgery is being performed? "Medical Clearance for Dental Services"   2. When is this surgery scheduled? Pending    3. What type of clearance is required (medical clearance vs. Pharmacy clearance to hold med vs. Both)? both  4. Are there any medications that need to be held prior to surgery and how long? Not listed, xarelto   5. Practice name and name of physician performing surgery? [facility]  Not listed    6. What is your office phone number [phone]    7.   What is your office fax number [phone]  8.   Anesthesia type (None, local, MAC, general) ? Not listed    Joaquim Lai 08/07/2017, 5:09 PM  _________________________________________________________________   (provider comments below)

## 2017-08-08 NOTE — Telephone Encounter (Signed)
Recommend continuing Xarelto for dental cleaning, even with fillings or root canal. Pt has history of afib with stroke (CHADS2VASc score of 6 - HTN, CHF, DM, CAD, stroke) and would be at high risk to discontinue anticoagulation.

## 2017-08-08 NOTE — Telephone Encounter (Signed)
   Primary Cardiologist: Will Jorja LoaMartin Camnitz, MD  Chart reviewed as part of pre-operative protocol coverage. Given past medical history of sleep apnea, atrial fib, ICD due to hypertrophic cardiomyopathy and septal myectomy and time since last visit 07/20/17, based on ACC/AHA guidelines, Kyle Peterson would be at acceptable risk for the planned procedure without further cardiovascular testing.   We do no recommend stopping the xarelto for his procedures.  I will route this recommendation to the requesting party via Epic fax function and remove from pre-op pool.  Please call with questions.  Nada BoozerLaura Ingold, NP 08/08/2017, 5:10 PM

## 2017-08-08 NOTE — Telephone Encounter (Signed)
Please check with dentist as to what the pt needs.  If for cleaning or needs teeth pulled. Thank you.

## 2017-08-08 NOTE — Telephone Encounter (Signed)
Pharm please address xarelto.  Do we stop for deep cleaning?

## 2017-08-08 NOTE — Telephone Encounter (Signed)
Called [facility] and spoke with Rachael who verified pt needs deep cleaning mainly due to gum disease. Rachael states that pt will also needs some fillings and root canals. Rachael is going to fax over to our office a consent form on how pt should hold Xarelto. She was thankful for the call.

## 2017-08-11 ENCOUNTER — Other Ambulatory Visit: Payer: Self-pay | Admitting: Cardiology

## 2017-08-11 DIAGNOSIS — I480 Paroxysmal atrial fibrillation: Secondary | ICD-10-CM

## 2017-08-11 NOTE — Telephone Encounter (Signed)
Age 52 years Wt 131.5kg  07/20/2017 Saw Dr Mayford Knifeurner on 07/20/2017 07/28/2017 SrCr 1.10 Hgb 16.6 HCT 49.7 CrCl 147.77 Refill done for Xarelto 20 mg daily as requested

## 2017-08-14 ENCOUNTER — Telehealth: Payer: Self-pay | Admitting: *Deleted

## 2017-08-14 NOTE — Telephone Encounter (Signed)
Spoke with pt and he states he did take last dose of Xarelto on Saturday  This nurse called [facility]  [place] and spoke to  Sterlinghristy the pharmacist and she states can fill Xarelto 20 mg daily for 30 days as he is out of this medication but will have to call and speak with [facility] as refill was sent in to them  and when she does this she will call the pt to let him know can pick up Xarelto  This nurse called and gave pt the above information  with verbalization of understanding

## 2017-09-18 ENCOUNTER — Ambulatory Visit (INDEPENDENT_AMBULATORY_CARE_PROVIDER_SITE_OTHER): Payer: Medicare HMO | Admitting: *Deleted

## 2017-09-18 DIAGNOSIS — I422 Other hypertrophic cardiomyopathy: Secondary | ICD-10-CM | POA: Diagnosis not present

## 2017-09-18 NOTE — Progress Notes (Signed)
Remote ICD transmission.   

## 2017-09-25 DIAGNOSIS — G4733 Obstructive sleep apnea (adult) (pediatric): Secondary | ICD-10-CM | POA: Diagnosis not present

## 2017-09-29 ENCOUNTER — Other Ambulatory Visit: Payer: Self-pay

## 2017-09-29 ENCOUNTER — Other Ambulatory Visit: Payer: Self-pay | Admitting: Cardiology

## 2017-10-13 ENCOUNTER — Other Ambulatory Visit: Payer: Self-pay

## 2017-10-16 ENCOUNTER — Other Ambulatory Visit: Payer: Medicare HMO | Admitting: *Deleted

## 2017-10-16 DIAGNOSIS — E785 Hyperlipidemia, unspecified: Secondary | ICD-10-CM | POA: Diagnosis not present

## 2017-10-16 LAB — LIPID PANEL
Chol/HDL Ratio: 3.3 ratio (ref 0.0–5.0)
Cholesterol, Total: 116 mg/dL (ref 100–199)
HDL: 35 mg/dL — AB (ref >39)
LDL Calculated: 58 mg/dL (ref 0–99)
Triglycerides: 116 mg/dL (ref 0–149)
VLDL CHOLESTEROL CAL: 23 mg/dL (ref 5–40)

## 2017-10-16 LAB — ALT: ALT: 18 IU/L (ref 0–44)

## 2017-10-23 LAB — CUP PACEART REMOTE DEVICE CHECK
Date Time Interrogation Session: 20190923130228
Implantable Lead Model: 292
Implantable Lead Serial Number: 358834
Implantable Pulse Generator Implant Date: 20160218
MDC IDC LEAD IMPLANT DT: 20160218
MDC IDC LEAD LOCATION: 753860
MDC IDC PG SERIAL: 202913

## 2017-10-25 ENCOUNTER — Other Ambulatory Visit: Payer: Self-pay | Admitting: Family Medicine

## 2017-12-13 ENCOUNTER — Ambulatory Visit (INDEPENDENT_AMBULATORY_CARE_PROVIDER_SITE_OTHER): Payer: Medicare HMO | Admitting: Orthopaedic Surgery

## 2017-12-13 ENCOUNTER — Ambulatory Visit (INDEPENDENT_AMBULATORY_CARE_PROVIDER_SITE_OTHER): Payer: Medicare HMO

## 2017-12-13 ENCOUNTER — Encounter (INDEPENDENT_AMBULATORY_CARE_PROVIDER_SITE_OTHER): Payer: Self-pay | Admitting: Orthopaedic Surgery

## 2017-12-13 VITALS — BP 153/101 | HR 63 | Ht 68.0 in | Wt 285.0 lb

## 2017-12-13 DIAGNOSIS — M545 Low back pain, unspecified: Secondary | ICD-10-CM

## 2017-12-13 DIAGNOSIS — M76821 Posterior tibial tendinitis, right leg: Secondary | ICD-10-CM

## 2017-12-13 DIAGNOSIS — G8929 Other chronic pain: Secondary | ICD-10-CM

## 2017-12-13 DIAGNOSIS — Z6841 Body Mass Index (BMI) 40.0 and over, adult: Secondary | ICD-10-CM

## 2017-12-13 DIAGNOSIS — M25571 Pain in right ankle and joints of right foot: Secondary | ICD-10-CM

## 2017-12-13 NOTE — Progress Notes (Signed)
Office Visit Note   Patient: Kyle Peterson           Date of Birth: 09/24/1965           MRN: [id number] Visit Date: 12/13/2017              Requested by: Carney Livinghambliss, Marshall L, MD [address] [place], [place] [address] PCP: Carney Livinghambliss, Marshall L, MD   Assessment & Plan: Visit Diagnoses:  1. Chronic bilateral low back pain, unspecified whether sciatica present   2. Pain in right ankle and joints of right foot   3. Class 3 severe obesity due to excess calories with serious comorbidity and body mass index (BMI) of 40.0 to 44.9 in adult (HCC)   4. Posterior tibial tendonitis, right     Plan: Longitudinal arch support for Kyle Peterson posterior tibial tendinitis right foot.  We will set him up for some physical therapy for Kyle Peterson chronic low back pain.  We discussed weight loss with Kyle Peterson problem with morbid obesity and back and foot symptoms and comorbidities coronary artery disease, hyperlipidemia hypertension and cardiomyopathy.  He will return in 2 months.  Follow-Up Instructions: Return in about 2 months (around 02/12/2018).   Orders:  Orders Placed This Encounter  Procedures  . XR Ankle Complete Right  . XR Lumbar Spine 2-3 Views  . Ambulatory referral to Physical Therapy   No orders of the defined types were placed in this encounter.     Procedures: No procedures performed   Clinical Data: No additional findings.   Subjective: Chief Complaint  Patient presents with  . Lower Back - Pain  . Right Ankle - Pain    HPI 52 year old male returns with problems with intermittent chronic low back pain.  Symptoms come and go at times he states he can hardly walk other times he is doing better.  He states Kyle Peterson dates back to 20 years ago when he is carrying shingles up proves.  He has had right ankle pain for couple months getting worse difficulty walking and limping and states this is making Kyle Peterson back worse.  No numbness or tingling in Kyle Peterson foot.  Pain is on the medial aspect of Kyle Peterson  foot.  He has bilateral pes planus. Review of Systems positive for morbid obesity, coronary artery disease chronic low back pain, diabetes, claudication, PTSD.  Patient is disabled not working.  History of trigger finger otherwise negative is a pertains HPI 14 point systems updated.   Objective: Vital Signs: BP (!) 153/101   Pulse 63   Ht 5\' 8"  (1.727 m)   Wt 285 lb (129.3 kg)   BMI 43.33 kg/m   Physical Exam  Constitutional: He is oriented to person, place, and time. He appears well-developed and well-nourished.  HENT:  Head: Normocephalic and atraumatic.  Eyes: Pupils are equal, round, and reactive to light. EOM are normal.  Neck: No tracheal deviation present. No thyromegaly present.  Cardiovascular: Normal rate.  Pulmonary/Chest: Effort normal. He has no wheezes.  Abdominal: Soft. Bowel sounds are normal.  Neurological: He is alert and oriented to person, place, and time.  Skin: Skin is warm and dry. Capillary refill takes less than 2 seconds.  Psychiatric: He has a normal mood and affect. Kyle Peterson behavior is normal. Judgment and thought content normal.    Ortho Exam patient has tenderness of posterior tibial tendon right medial foot none on the left bilateral pes planus.  Negative straight leg raising.  No pain with logroll the hips.  Knees  reach full extension slight venous discoloration without active ulcers no plantar foot lesions.  No tenderness over the lumbar spine.  Slight decreased sensation over the anterior thighs right and left.  No quad weakness or hip flexion weakness.  Specialty Comments:  No specialty comments available.  Imaging: Xr Ankle Complete Right  Result Date: 12/13/2017 AP lateral mortise x-ray right ankle obtained and reviewed.  This shows normal bony architecture no acute changes.  No arthritic changes. Impression: Normal right ankle x-rays.  Xr Lumbar Spine 2-3 Views  Result Date: 12/13/2017 AP lateral lumbar spine x-rays are obtained and reviewed.   This shows normal hip joints and sacroiliac joint.  There is some facet arthropathy at L4-5 L5-S1.  No listhesis.  Slight narrowing L4-5 L5-S1 disc space. Impression: Normal lumbar x-rays with some facet arthritis worse at L4-5 and L5-S1.    PMFS History: Patient Active Problem List   Diagnosis Date Noted  . Chronic bilateral low back pain 12/13/2017  . Posterior tibial tendonitis, right 12/13/2017  . Class 3 severe obesity due to excess calories with serious comorbidity and body mass index (BMI) of 40.0 to 44.9 in adult (HCC) 12/13/2017  . Chest mass 05/30/2017  . S/P left rotator cuff repair 10/25/2016  . Severe obesity (BMI >= 40) (HCC) 09/27/2016  . Dilated aortic root (HCC) 07/20/2016  . Claudication (HCC) 07/20/2016  . Heart murmur 07/20/2016  . Trigger finger, left middle finger 06/21/2016  . Diabetes (HCC) 05/24/2016  . Depression, major, recurrent, moderate (HCC) 11/05/2015  . Chronic post-traumatic stress disorder (PTSD) 11/05/2015    Class: Chronic  . OSA (obstructive sleep apnea)   . Persistent atrial fibrillation 08/19/2015  . CAD (coronary artery disease), native coronary artery 08/19/2015  . Shoulder pain 06/11/2015  . Hyperlipidemia 06/10/2015  . Tobacco use disorder 05/28/2015  . Essential hypertension 05/27/2015  . Bipolar disorder (HCC) 05/27/2015  . Hypertrophic cardiomyopathy (HCC) 05/27/2015   Past Medical History:  Diagnosis Date  . AICD (automatic cardioverter/defibrillator) present    bostan scien  . Anginal pain (HCC)    ?heart pain   per dr Estill Dooms  . Anxiety   . Arthritis    ddd  . CAD (coronary artery disease), native coronary artery 08/19/2015  . CHF (congestive heart failure) (HCC)   . Depression   . Diabetes mellitus without complication (HCC)    no meds  new dx  . Heart murmur   . High cholesterol   . Hypertension   . Myocardial infarction (HCC)   . OSA (obstructive sleep apnea)    Severe with AHI 80/hr- new bipap  . PAF (paroxysmal  atrial fibrillation) (HCC) 08/19/2015  . PTSD (post-traumatic stress disorder)   . Stroke (HCC) 2016  . Substance abuse (HCC)     Family History  Problem Relation Age of Onset  . Diabetes Mother   . Hypertension Mother   . Diabetes Father   . Hypertension Father   . Heart disease Father   . Alcohol abuse Father   . Alcohol abuse Brother   . Drug abuse Brother     Past Surgical History:  Procedure Laterality Date  . CARDIAC CATHETERIZATION     01/13/14 [facility]: LM NL, mild narrowing of proximal/mid LAD, mid LCX, proximal RCA. 30% ostial OM1. 40% distal RCA. Medical tx.   . CARPAL TUNNEL RELEASE Left 11/02/2015  . CERVICAL DISCECTOMY    . MYOMECTOMY    . SHOULDER ARTHROSCOPY WITH ROTATOR CUFF REPAIR Left 09/09/2016   Procedure: LEFT  SHOULDER ARTHROSCOPY WITH MINI OPEN ROTATOR CUFF REPAIR;  Surgeon: Eldred Manges, MD;  Location[facility] OR;  Service: Orthopedics;  Laterality: Left;  . TONSILLECTOMY     Social History   Occupational History  . Not on file  Tobacco Use  . Smoking status: Current Every Day Smoker    Packs/day: 0.50    Years: 40.00    Pack years: 20.00    Types: Cigarettes  . Smokeless tobacco: Never Used  . Tobacco comment: Trying to cut back   Substance and Sexual Activity  . Alcohol use: No    Alcohol/week: 0.0 standard drinks  . Drug use: Yes    Frequency: 3.0 times per week    Types: Marijuana    Comment: last 07/26/16  . Sexual activity: Not Currently

## 2017-12-18 ENCOUNTER — Ambulatory Visit (INDEPENDENT_AMBULATORY_CARE_PROVIDER_SITE_OTHER): Payer: Medicare HMO

## 2017-12-18 DIAGNOSIS — I422 Other hypertrophic cardiomyopathy: Secondary | ICD-10-CM | POA: Diagnosis not present

## 2017-12-18 NOTE — Progress Notes (Signed)
Remote ICD transmission.   

## 2017-12-19 ENCOUNTER — Encounter: Payer: Self-pay | Admitting: Physical Therapy

## 2017-12-19 ENCOUNTER — Other Ambulatory Visit: Payer: Self-pay

## 2017-12-19 ENCOUNTER — Ambulatory Visit: Payer: Medicare HMO | Attending: Orthopaedic Surgery | Admitting: Physical Therapy

## 2017-12-19 DIAGNOSIS — R2689 Other abnormalities of gait and mobility: Secondary | ICD-10-CM | POA: Diagnosis not present

## 2017-12-19 DIAGNOSIS — G8929 Other chronic pain: Secondary | ICD-10-CM | POA: Diagnosis not present

## 2017-12-19 DIAGNOSIS — M62830 Muscle spasm of back: Secondary | ICD-10-CM | POA: Insufficient documentation

## 2017-12-19 DIAGNOSIS — R293 Abnormal posture: Secondary | ICD-10-CM | POA: Diagnosis not present

## 2017-12-19 DIAGNOSIS — M545 Low back pain, unspecified: Secondary | ICD-10-CM
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
## 2017-12-21 ENCOUNTER — Encounter: Payer: Self-pay | Admitting: Cardiology

## 2017-12-26 ENCOUNTER — Telehealth: Payer: Self-pay | Admitting: Family Medicine

## 2017-12-26 NOTE — Telephone Encounter (Signed)
Spoke with pt about scheduling a COL through [facility] given overdue status and gave pt their contact information; recommended to pt they contact them soon to schedule the appt for the day that works best for them. -CH

## 2018-01-02 ENCOUNTER — Encounter: Payer: Self-pay | Admitting: Physical Therapy

## 2018-01-02 ENCOUNTER — Ambulatory Visit: Payer: Medicare HMO | Attending: Orthopaedic Surgery | Admitting: Physical Therapy

## 2018-01-02 DIAGNOSIS — G8929 Other chronic pain: Secondary | ICD-10-CM | POA: Diagnosis not present

## 2018-01-02 DIAGNOSIS — R293 Abnormal posture: Secondary | ICD-10-CM | POA: Diagnosis not present

## 2018-01-02 DIAGNOSIS — M545 Low back pain, unspecified: Secondary | ICD-10-CM

## 2018-01-02 DIAGNOSIS — M62830 Muscle spasm of back: Secondary | ICD-10-CM | POA: Diagnosis not present

## 2018-01-02 DIAGNOSIS — R2689 Other abnormalities of gait and mobility: Secondary | ICD-10-CM | POA: Diagnosis not present

## 2018-01-02 DIAGNOSIS — G4733 Obstructive sleep apnea (adult) (pediatric): Secondary | ICD-10-CM | POA: Diagnosis not present

## 2018-01-02 NOTE — Patient Instructions (Signed)
Seated Hamstring Stretch sets: 3 hold: 30sec daily: 1-2 weekly: 7   Exercise image step 1   Exercise image step 2 Setup  Begin sitting upright with one leg straight forward and your heel resting on the ground. Movement  Bend your trunk forward, hinging at your hips until you feel a stretch in the back of your leg. Hold this position.  Tip  Make sure to keep your knee straight during the stretch and do not let your back arch or slump. Standing Paraspinals Mobilization with Small Ball on Wall reps: 10 sets: 3 hold: 30sec daily: 1  weekly: 7      Exercise image step 1   Exercise image step 2   Exercise image step 3 Setup  Disclaimer: This program provides exercises related to your condition that you can perform at home. As there is a risk of injury with any activity, use caution when performing exercises. If you experience any pain or discomfort, discontinue the exercises and contact your health care provider.  Login URL: [email or web address] Access Code: [email or web address] . Date printed: 01/02/2018 Page 2  Begin in a standing upright position in front of a wall, holding a small, firm ball. Place the ball between your low back, and the wall, slightly to the side of your spine. Movement  Slowly roll your back side to side, then up and down on the ball until you feel a stretch or muscle release. Hold briefly on any tight spots, then continue rolling. Tip  Make sure to use just enough pressure that you feel a stretch, but no pain. Seated Pelvic Tilt reps: 10 sets: 3 daily: 1 weekly: 7   Exercise image step 1   Exercise image step 2 Setup  Begin sitting upright in a chair with your hands on your hips.  Movement  Gently tilt your pelvis backward, then return to a neutral position, and tilt it forward. Repeat, monitoring the movement with your hands. Tip  Make sure to keep your upper back relaxed during the exercise, and focus the movement just on your pelvis.

## 2018-01-02 NOTE — Therapy (Signed)
[facility] [place], [place], [address] Phone: [phone]   Fax:  [phone]  Physical Therapy Treatment  Patient Details  Name: Kyle Peterson MRN: [id number] Date of Birth: 1965-06-04 Referring Provider (PT): Rodell Perna MD   Encounter Date: 01/02/2018  PT End of Session - 01/02/18 1219    Visit Number  2    Number of Visits  13    Date for PT Re-Evaluation  02/13/18    Authorization Type  MCD (reassess/ submit at 4th visit)    PT Start Time  1103    PT Stop Time  1148    PT Time Calculation (min)  45 min    Activity Tolerance  Patient tolerated treatment well    Behavior During Therapy  [facility] for tasks assessed/performed       Past Medical History:  Diagnosis Date  . AICD (automatic cardioverter/defibrillator) present    bostan scien  . Anginal pain ([place])    ?heart pain   per dr Ileana Ladd  . Anxiety   . Arthritis    ddd  . CAD (coronary artery disease), native coronary artery 08/19/2015  . CHF (congestive heart failure) ([place])   . Depression   . Diabetes mellitus without complication ([place])    no meds  new dx  . Heart murmur   . High cholesterol   . Hypertension   . Myocardial infarction ([place])   . OSA (obstructive sleep apnea)    Severe with AHI 80/hr- new bipap  . PAF (paroxysmal atrial fibrillation) ([place]) 08/19/2015  . PTSD (post-traumatic stress disorder)   . Stroke ([place]) 2016  . Substance abuse [facility])     Past Surgical History:  Procedure Laterality Date  . CARDIAC CATHETERIZATION     01/13/14 [facility]: LM NL, mild narrowing of proximal/mid LAD, mid LCX, proximal RCA. 30% ostial OM1. 40% distal RCA. Medical tx.   . CARPAL TUNNEL RELEASE Left 11/02/2015  . CERVICAL DISCECTOMY    . MYOMECTOMY    . SHOULDER ARTHROSCOPY WITH ROTATOR CUFF REPAIR Left 09/09/2016   Procedure: LEFT SHOULDER ARTHROSCOPY WITH MINI OPEN ROTATOR CUFF REPAIR;  Surgeon: Marybelle Killings, MD;  Location: [place];  Service:  Orthopedics;  Laterality: Left;  . TONSILLECTOMY      There were no vitals filed for this visit.  Subjective Assessment - 01/02/18 1108    Subjective  When really hurting pain is dibilitating and even has to use cane to get out of bed and walk around at times. Not able to do all of the exercises from eval.     How long can you sit comfortably?  45 min    How long can you stand comfortably?  45 min     How long can you walk comfortably?  45 min    Diagnostic tests  x-ray 12/13/2017 Normal lumbar x-rays with some facet arthritis worse at L4-5 and L5-S1.    Patient Stated Goals  to decrease pain, to get back to walking / exercise    Currently in Pain?  Yes    Pain Score  5     Pain Location  Back    Pain Orientation  Lower    Pain Descriptors / Indicators  Aching;Tingling;Sore    Pain Type  Chronic pain    Pain Onset  More than a month ago    Pain Frequency  Constant    Aggravating Factors   prolonged sitting/ standoing/ walking/ getting outof bed  Pain Relieving Factors  medication PRN     Effect of Pain on Daily Activities  limited mobility                        [facility] Adult PT Treatment/Exercise - 01/02/18 0001      Exercises   Exercises  Lumbar      Lumbar Exercises: Stretches   Active Hamstring Stretch Limitations  reviewed seated 2x20 sec hold.       Lumbar Exercises: Standing   Other Standing Lumbar Exercises  tennis ball trigger point release       Lumbar Exercises: Seated   Other Seated Lumbar Exercises  posterior pelvic tilt 2x10       Manual Therapy   Manual Therapy  Joint mobilization;Soft tissue mobilization;Muscle Energy Technique;Manual Traction    Joint Mobilization  Posterior hip mobilization to improve hip flexion     Soft tissue mobilization  in sidelying to lumbar paraspianls and quadratus     Manual Traction  LAD 3x30sec hold bilateral     Muscle Energy Technique  hip flexor MET                PT Short Term Goals - 12/19/17  1147      PT SHORT TERM GOAL #1   Title  pt to be I with initial HEP    Baseline   no previous HEP    Time  3    Period  Weeks    Status  New    Target Date  01/09/18      PT SHORT TERM GOAL #2   Title  pt to verbalize and demo proper posture and lifting mechanics to reduce and prevent low back pain     Baseline  no knowledge of proper posture    Time  3    Period  Weeks    Status  New    Target Date  01/09/18      PT SHORT TERM GOAL #3   Title  pt to demonstrate reduce low back muscle spasm to promote trunk mobility and reduce pain to </= 4/10     Baseline  5/10 pain today with trunk mobility     Time  3    Period  Weeks    Status  New    Target Date  01/09/18        PT Long Term Goals - 12/19/17 1149      PT LONG TERM GOAL #1   Title  increase trunk flexion to >/= 70 degrees and bil sidebending to>/= 20 degrees with </= 2/10 pain for funtional mobility required for ADLs    Baseline  flexion 40 degrees r sidebending  , L sidebending     Time  6    Period  Weeks    Status  New    Target Date  02/13/18      PT LONG TERM GOAL #2   Title  pt to be abel to sit/ stand and walk for >/= 60 min for personal goal of returning to exercise reporting </= 1/10 pain    Baseline  able to sit, stand and walk 45 min with pain increasing to 6-7/10     Time  6    Period  Weeks    Status  New    Target Date  02/13/18      PT LONG TERM GOAL #3   Title  increase ODI by </= 10% to demo  improvement in function     Baseline  26% disability    Time  6    Period  Weeks    Status  New    Target Date  02/13/18      PT LONG TERM GOAL #4   Title  pt to be I with all HEP given as of last visit to maintain and progress current level of function     Baseline  no previous HEP    Time  6    Period  Weeks    Status  New    Target Date  02/13/18      PT LONG TERM GOAL #5   Title  -    Baseline  -            Plan - 01/02/18 1220    Clinical Impression Statement  Patient tolerated  treatment well. He had some pain with supine poistioning depsiteusing a wedge. He had significant trigger points in his parapsinals. He had had pain with posterior pelvic tilt lying down and with the hamstring stretch with his HEP. He was given both streytches sitting up and did much better. The patient had difficulty generating much of a muslce contraction with his MET. He may need to do that when his pain levels decrease. Therapy will continue to progress as tolerated     Clinical Presentation  Evolving    Clinical Decision Making  Moderate    Rehab Potential  Good    PT Frequency  2x / week    PT Duration  6 weeks    PT Treatment/Interventions  ADLs/Self Care Home Management;Cryotherapy;Electrical Stimulation;Iontophoresis 56m/ml Dexamethasone;Moist Heat;Traction;Ultrasound;Neuromuscular re-education;Therapeutic activities;Therapeutic exercise;Manual techniques;Taping;Dry needling;Passive range of motion;Patient/family education;Spinal Manipulations;Gait training;Stair training    PT Next Visit Plan  review/ update HEP, potential SIJ involvement (posteriorly) hamstring stretching/ SLR and MET techniques, potential disc involvement, trial repeated extension in standing (doesn't tolerate prone well), modalities PRN; assess tolerance to manual therapy and new exercises. Adavance core strengthening as tolerated.     PT Home Exercise Plan  lower trunk rotation, hamstring stretching, SLR, posterior pelvic tilt    Consulted and Agree with Plan of Care  Patient       Patient will benefit from skilled therapeutic intervention in order to improve the following deficits and impairments:  Obesity, Pain, Increased fascial restricitons, Increased muscle spasms, Abnormal gait, Decreased endurance, Decreased activity tolerance, Decreased balance, Decreased range of motion, Postural dysfunction, Improper body mechanics  Visit Diagnosis: Chronic bilateral low back pain, unspecified whether sciatica  present  Abnormal posture  Other abnormalities of gait and mobility  Muscle spasm of back     Problem List Patient Active Problem List   Diagnosis Date Noted  . Chronic bilateral low back pain 12/13/2017  . Posterior tibial tendonitis, right 12/13/2017  . Class 3 severe obesity due to excess calories with serious comorbidity and body mass index (BMI) of 40.0 to 44.9 in adult ([place] 12/13/2017  . Chest mass 05/30/2017  . S/P left rotator cuff repair 10/25/2016  . Severe obesity (BMI >= 40) ([place] 09/27/2016  . Dilated aortic root ([place] 07/20/2016  . Claudication ([place] 07/20/2016  . Heart murmur 07/20/2016  . Trigger finger, left middle finger 06/21/2016  . Diabetes ([place] 05/24/2016  . Depression, major, recurrent, moderate ([place] 11/05/2015  . Chronic post-traumatic stress disorder (PTSD) 11/05/2015    Class: Chronic  . OSA (obstructive sleep apnea)   . Persistent atrial fibrillation 08/19/2015  . CAD (coronary artery disease), native  coronary artery 08/19/2015  . Shoulder pain 06/11/2015  . Hyperlipidemia 06/10/2015  . Tobacco use disorder 05/28/2015  . Essential hypertension 05/27/2015  . Bipolar disorder ([place]) 05/27/2015  . Hypertrophic cardiomyopathy ([place]) 05/27/2015    Carney Living  PT DPT  01/02/2018, 2:24 PM   Einar Crow SPT  01/02/2018   During this treatment session, the therapist was present, participating in and directing the treatment.   [place], [place], [address] Phone: [phone]   Fax:  [phone]  Name: Kyle Peterson MRN: [id number] Date of Birth: [birth year]

## 2018-01-08 ENCOUNTER — Encounter: Payer: Self-pay | Admitting: Physical Therapy

## 2018-01-08 ENCOUNTER — Ambulatory Visit: Payer: Medicare HMO | Admitting: Physical Therapy

## 2018-01-08 DIAGNOSIS — R293 Abnormal posture: Secondary | ICD-10-CM

## 2018-01-08 DIAGNOSIS — G8929 Other chronic pain: Secondary | ICD-10-CM | POA: Diagnosis not present

## 2018-01-08 DIAGNOSIS — R2689 Other abnormalities of gait and mobility: Secondary | ICD-10-CM | POA: Diagnosis not present

## 2018-01-08 DIAGNOSIS — M545 Low back pain, unspecified: Secondary | ICD-10-CM

## 2018-01-08 DIAGNOSIS — M62830 Muscle spasm of back: Secondary | ICD-10-CM

## 2018-01-08 NOTE — Therapy (Signed)
[place], [place], [address] Phone: [phone]   Fax:  [phone]  Physical Therapy Treatment  Patient Details  Name: Kyle Peterson MRN: [id number] Date of Birth: 1965-03-23 Referring Provider (PT): Rodell Perna MD   Encounter Date: 01/08/2018  PT End of Session - 01/08/18 1329    Visit Number  3    Number of Visits  13    Date for PT Re-Evaluation  02/13/18    Authorization Type  MCR with MCD secondary    PT Start Time  1330    PT Stop Time  1414    PT Time Calculation (min)  44 min    Activity Tolerance  Patient tolerated treatment well    Behavior During Therapy  [facility] for tasks assessed/performed       Past Medical History:  Diagnosis Date  . AICD (automatic cardioverter/defibrillator) present    bostan scien  . Anginal pain ([place])    ?heart pain   per dr Ileana Ladd  . Anxiety   . Arthritis    ddd  . CAD (coronary artery disease), native coronary artery 08/19/2015  . CHF (congestive heart failure) ([place])   . Depression   . Diabetes mellitus without complication ([place])    no meds  new dx  . Heart murmur   . High cholesterol   . Hypertension   . Myocardial infarction ([place])   . OSA (obstructive sleep apnea)    Severe with AHI 80/hr- new bipap  . PAF (paroxysmal atrial fibrillation) ([place]) 08/19/2015  . PTSD (post-traumatic stress disorder)   . Stroke ([place]) 2016  . Substance abuse [facility])     Past Surgical History:  Procedure Laterality Date  . CARDIAC CATHETERIZATION     01/13/14 [facility]: LM NL, mild narrowing of proximal/mid LAD, mid LCX, proximal RCA. 30% ostial OM1. 40% distal RCA. Medical tx.   . CARPAL TUNNEL RELEASE Left 11/02/2015  . CERVICAL DISCECTOMY    . MYOMECTOMY    . SHOULDER ARTHROSCOPY WITH ROTATOR CUFF REPAIR Left 09/09/2016   Procedure: LEFT SHOULDER ARTHROSCOPY WITH MINI OPEN ROTATOR CUFF REPAIR;  Surgeon: Marybelle Killings, MD;  Location: [place];  Service: Orthopedics;   Laterality: Left;  . TONSILLECTOMY      There were no vitals filed for this visit.  Subjective Assessment - 01/08/18 1334    Subjective  "I am doing alittle better in the back today, oddly my ankle is more sore"     Currently in Pain?  Yes    Pain Score  3     Pain Location  Back    Pain Orientation  Lower    Pain Descriptors / Indicators  Aching;Sore                       [facility] Adult PT Treatment/Exercise - 01/08/18 0001      Self-Care   Self-Care  Posture    Posture  proper posture and lifting mecahnics with handout       Exercises   Exercises  Lumbar      Lumbar Exercises: Seated   Other Seated Lumbar Exercises  posterior pelvic tilt 2x10, horizongla lift/ chop with red theraband, and palloff press 2 x 10      Manual Therapy   Manual therapy comments  MTPR along bil lumbar paraspinals    Soft tissue mobilization  IASTM along bil lumbar paraspinals  PT Education - 01/08/18 1430    Education Details  proper posture and lifting mechanics, benefits of core strengthening.     Person(s) Educated  Patient    Methods  Explanation;Verbal cues;Handout    Comprehension  Verbalized understanding;Verbal cues required       PT Short Term Goals - 12/19/17 1147      PT SHORT TERM GOAL #1   Title  pt to be I with initial HEP    Baseline   no previous HEP    Time  3    Period  Weeks    Status  New    Target Date  01/09/18      PT SHORT TERM GOAL #2   Title  pt to verbalize and demo proper posture and lifting mechanics to reduce and prevent low back pain     Baseline  no knowledge of proper posture    Time  3    Period  Weeks    Status  New    Target Date  01/09/18      PT SHORT TERM GOAL #3   Title  pt to demonstrate reduce low back muscle spasm to promote trunk mobility and reduce pain to </= 4/10     Baseline  5/10 pain today with trunk mobility     Time  3    Period  Weeks    Status  New    Target Date  01/09/18        PT Long  Term Goals - 12/19/17 1149      PT LONG TERM GOAL #1   Title  increase trunk flexion to >/= 70 degrees and bil sidebending to>/= 20 degrees with </= 2/10 pain for funtional mobility required for ADLs    Baseline  flexion 40 degrees r sidebending  , L sidebending     Time  6    Period  Weeks    Status  New    Target Date  02/13/18      PT LONG TERM GOAL #2   Title  pt to be abel to sit/ stand and walk for >/= 60 min for personal goal of returning to exercise reporting </= 1/10 pain    Baseline  able to sit, stand and walk 45 min with pain increasing to 6-7/10     Time  6    Period  Weeks    Status  New    Target Date  02/13/18      PT LONG TERM GOAL #3   Title  increase ODI by </= 10% to demo improvement in function     Baseline  26% disability    Time  6    Period  Weeks    Status  New    Target Date  02/13/18      PT LONG TERM GOAL #4   Title  pt to be I with all HEP given as of last visit to maintain and progress current level of function     Baseline  no previous HEP    Time  6    Period  Weeks    Status  New    Target Date  02/13/18      PT LONG TERM GOAL #5   Title  -    Baseline  -            Plan - 01/08/18 1428    Clinical Impression Statement  discussed DN which he reported he would rather do  more reading on it. continued STW along bil lumbar paraspinals. time spent going over posture and lifting mechanics to and provided handout. worked on core strengthening. end of session he reported decreased pain with walking/ standing.     PT Treatment/Interventions  ADLs/Self Care Home Management;Cryotherapy;Electrical Stimulation;Iontophoresis '4mg'$ /ml Dexamethasone;Moist Heat;Traction;Ultrasound;Neuromuscular re-education;Therapeutic activities;Therapeutic exercise;Manual techniques;Taping;Dry needling;Passive range of motion;Patient/family education;Spinal Manipulations;Gait training;Stair training    PT Next Visit Plan  review/ update HEP, potential SIJ involvement  (posteriorly) hamstring stretching/ SLR and MET techniques, potential disc involvement, trial repeated extension in standing (doesn't tolerate prone well), modalities PRN; assess tolerance to manual therapy and new exercises. Adavance core strengthening as tolerated.     PT Home Exercise Plan  lower trunk rotation, hamstring stretching, SLR, posterior pelvic tilt    Consulted and Agree with Plan of Care  Patient       Patient will benefit from skilled therapeutic intervention in order to improve the following deficits and impairments:  Obesity, Pain, Increased fascial restricitons, Increased muscle spasms, Abnormal gait, Decreased endurance, Decreased activity tolerance, Decreased balance, Decreased range of motion, Postural dysfunction, Improper body mechanics  Visit Diagnosis: Chronic bilateral low back pain, unspecified whether sciatica present  Other abnormalities of gait and mobility  Abnormal posture  Muscle spasm of back     Problem List Patient Active Problem List   Diagnosis Date Noted  . Chronic bilateral low back pain 12/13/2017  . Posterior tibial tendonitis, right 12/13/2017  . Class 3 severe obesity due to excess calories with serious comorbidity and body mass index (BMI) of 40.0 to 44.9 in adult ([place]) 12/13/2017  . Chest mass 05/30/2017  . S/P left rotator cuff repair 10/25/2016  . Severe obesity (BMI >= 40) ([place]) 09/27/2016  . Dilated aortic root ([place]) 07/20/2016  . Claudication ([place]) 07/20/2016  . Heart murmur 07/20/2016  . Trigger finger, left middle finger 06/21/2016  . Diabetes ([place]) 05/24/2016  . Depression, major, recurrent, moderate ([place]) 11/05/2015  . Chronic post-traumatic stress disorder (PTSD) 11/05/2015    Class: Chronic  . OSA (obstructive sleep apnea)   . Persistent atrial fibrillation 08/19/2015  . CAD (coronary artery disease), native coronary artery 08/19/2015  . Shoulder pain 06/11/2015  . Hyperlipidemia 06/10/2015  . Tobacco use disorder  05/28/2015  . Essential hypertension 05/27/2015  . Bipolar disorder ([place]) 05/27/2015  . Hypertrophic cardiomyopathy ([place]) 05/27/2015   Starr Lake PT, DPT, LAT, ATC  01/08/18  2:34 PM      [facility] [address] [place], [place], [address] Phone: [phone]   Fax:  [phone]  Name: Kyle Peterson MRN: [id number] Date of Birth: May 18, 1965

## 2018-01-08 NOTE — Patient Instructions (Signed)

## 2018-01-15 ENCOUNTER — Ambulatory Visit: Payer: Self-pay | Admitting: Cardiology

## 2018-01-15 ENCOUNTER — Other Ambulatory Visit: Payer: Self-pay | Admitting: Cardiology

## 2018-01-15 ENCOUNTER — Telehealth: Payer: Self-pay | Admitting: Physical Therapy

## 2018-01-15 ENCOUNTER — Ambulatory Visit: Payer: Medicare HMO | Admitting: Physical Therapy

## 2018-01-15 ENCOUNTER — Other Ambulatory Visit: Payer: Self-pay | Admitting: Family Medicine

## 2018-01-15 DIAGNOSIS — I480 Paroxysmal atrial fibrillation: Secondary | ICD-10-CM

## 2018-01-15 NOTE — Telephone Encounter (Signed)
Pt is a [age] yr male who saw Dr. Mayford Knifeurner on 07/20/17. Weight on that visit was 131.5Kg. SCr on 07/28/17 was 1.10. CrCl is 15945mL/min. Will refill Xarelto 20mg  QD.

## 2018-01-15 NOTE — Telephone Encounter (Signed)
Called patient about missing today's visit, he stated he just forgot about it today but states he plans to attend his next scheduled appointment. Reviewed the next schedule appointment day / time and that if he is unable to attend to call and we can work to cancel or reschedule it for him.

## 2018-01-25 ENCOUNTER — Ambulatory Visit: Payer: Medicare HMO | Admitting: Physical Therapy

## 2018-01-25 ENCOUNTER — Encounter: Payer: Self-pay | Admitting: Physical Therapy

## 2018-01-25 DIAGNOSIS — G8929 Other chronic pain: Secondary | ICD-10-CM

## 2018-01-25 DIAGNOSIS — R293 Abnormal posture: Secondary | ICD-10-CM

## 2018-01-25 DIAGNOSIS — R2689 Other abnormalities of gait and mobility: Secondary | ICD-10-CM

## 2018-01-25 DIAGNOSIS — M62830 Muscle spasm of back: Secondary | ICD-10-CM | POA: Diagnosis not present

## 2018-01-25 DIAGNOSIS — M545 Low back pain, unspecified: Secondary | ICD-10-CM

## 2018-01-25 NOTE — Therapy (Addendum)
[place], [place], [address] Phone: [phone]   Fax:  [phone]  Physical Therapy Treatment  Patient Details  Name: Kyle Peterson MRN: [id number] Date of Birth: 1965/12/03 Referring Provider (PT): Rodell Perna MD   Encounter Date: 01/25/2018  PT End of Session - 01/25/18 0854    Visit Number  4    Number of Visits  11    Date for PT Re-Evaluation  02/13/18    Authorization Type  MCR with MCD secondary    PT Start Time  0842    PT Stop Time  0922    PT Time Calculation (min)  40 min    Activity Tolerance  Patient tolerated treatment well    Behavior During Therapy  [facility] for tasks assessed/performed       Past Medical History:  Diagnosis Date  . AICD (automatic cardioverter/defibrillator) present    bostan scien  . Anginal pain ([place])    ?heart pain   per dr Ileana Ladd  . Anxiety   . Arthritis    ddd  . CAD (coronary artery disease), native coronary artery 08/19/2015  . CHF (congestive heart failure) ([place])   . Depression   . Diabetes mellitus without complication ([place])    no meds  new dx  . Heart murmur   . High cholesterol   . Hypertension   . Myocardial infarction ([place])   . OSA (obstructive sleep apnea)    Severe with AHI 80/hr- new bipap  . PAF (paroxysmal atrial fibrillation) ([place]) 08/19/2015  . PTSD (post-traumatic stress disorder)   . Stroke ([place]) 2016  . Substance abuse [facility])     Past Surgical History:  Procedure Laterality Date  . CARDIAC CATHETERIZATION     01/13/14 [facility]: LM NL, mild narrowing of proximal/mid LAD, mid LCX, proximal RCA. 30% ostial OM1. 40% distal RCA. Medical tx.   . CARPAL TUNNEL RELEASE Left 11/02/2015  . CERVICAL DISCECTOMY    . MYOMECTOMY    . SHOULDER ARTHROSCOPY WITH ROTATOR CUFF REPAIR Left 09/09/2016   Procedure: LEFT SHOULDER ARTHROSCOPY WITH MINI OPEN ROTATOR CUFF REPAIR;  Surgeon: Marybelle Killings, MD;  Location: [place];  Service: Orthopedics;   Laterality: Left;  . TONSILLECTOMY      There were no vitals filed for this visit.  Subjective Assessment - 01/25/18 0905    Subjective  Patient reports no signidfcant improvement. He had to leave chirstmas dinner yesterday because his back was hurting him so bad. He has been doing his exercises but cntinues to feel like his back is the same.     How long can you sit comfortably?  45 min    How long can you stand comfortably?  45 min     How long can you walk comfortably?  45 min    Diagnostic tests  x-ray 12/13/2017 Normal lumbar x-rays with some facet arthritis worse at L4-5 and L5-S1.    Patient Stated Goals  to decrease pain, to get back to walking / exercise    Currently in Pain?  Yes    Pain Score  4     Pain Location  Back    Pain Orientation  Right;Left;Lower    Pain Descriptors / Indicators  Aching;Sore    Pain Type  Chronic pain    Pain Onset  More than a month ago    Pain Frequency  Constant    Aggravating Factors   prolinged sitting,m standing/ walking  Pain Relieving Factors  medicationPRN     Effect of Pain on Daily Activities  limited mobility          [facility] PT Assessment - 01/25/18 0001      Strength   Overall Strength Comments  pain with flexion     Right Hip Flexion  4+/5    Right Hip Extension  4+/5    Right Hip ABduction  4+/5    Right Hip ADduction  4+/5    Left Hip Flexion  4+/5    Left Hip ABduction  4+/5    Left Hip ADduction  4+/5                   [facility] Adult PT Treatment/Exercise - 01/25/18 0001      Self-Care   Self-Care  ADL's;Other Self-Care Comments    Other Self-Care Comments   Significant time spent educating the patient on the benefits of stretching and the improtance of core strengthening. Therapy reviewed patients HEP with him. Therapy also discussed advancingbands whenabel.       Lumbar Exercises: Stretches   Active Hamstring Stretch Limitations  seated 3x20 sec each side     Piriformis Stretch Limitations  seated  3x20 sec hold       Lumbar Exercises: Aerobic   Nustep  x5 min       Lumbar Exercises: Standing   Row Limitations  2x10 with cuing for breathing     Shoulder Extension Limitations  2x10 blue with cuing for breathing     Other Standing Lumbar Exercises  chop with cuing for breathing    Other Standing Lumbar Exercises  pallof press 2x10 with cuing for breathing              PT Education - 01/25/18 0903    Education Details  reviewed final HEP     Person(s) Educated  Patient    Methods  Explanation;Tactile cues;Verbal cues;Demonstration    Comprehension  Returned demonstration;Verbal cues required;Verbalized understanding;Tactile cues required       PT Short Term Goals - 01/25/18 0913      PT SHORT TERM GOAL #1   Title  pt to be I with initial HEP    Baseline  perfroming HEP     Time  3    Period  Weeks    Status  Achieved      PT SHORT TERM GOAL #2   Title  pt to verbalize and demo proper posture and lifting mechanics to reduce and prevent low back pain     Baseline  has the knowledge of good posture for lifting. Has not been doing a lot of lifting lately.     Time  3    Period  Weeks    Status  Achieved      PT SHORT TERM GOAL #3   Title  pt to demonstrate reduce low back muscle spasm to promote trunk mobility and reduce pain to </= 4/10     Baseline  4/10 pain today with trunk mobility but can reach a 10/10     Time  3    Period  Weeks    Status  On-going        PT Long Term Goals - 01/25/18 3491      PT LONG TERM GOAL #1   Title  increase trunk flexion to >/= 70 degrees and bil sidebending to>/= 20 degrees with </= 2/10 pain for funtional mobility required for ADLs  Baseline  flexion 40 degrees r sidebending  , L sidebending     Time  6    Period  Weeks    Status  On-going      PT LONG TERM GOAL #2   Title  pt to be abel to sit/ stand and walk for >/= 60 min for personal goal of returning to exercise reporting </= 1/10 pain    Baseline  Pain still  increases     Time  6    Period  Weeks    Status  On-going            Plan - 01/25/18 0911    Clinical Impression Statement  The patient will call his MD for a follow up appointmnet, H eis concerend that his back is not improving. Therapy advised his that strength building takes time. he will keep his appointment but go back ot the MD for further follow up. Therapy added standing core stability exercises today and reviewed how to perfrom his HEP at home. He is conerend that there is somehting pressing on his nerves. He is also concerned that he has too much curve in his lumbar spine. Per his x-ray he only has mild disc degeneration. He will continue with PT while he goes back to MD.   Clinical Presentation  Evolving    Clinical Decision Making  Moderate    Rehab Potential  Good    PT Frequency  2x / week    PT Duration  6 weeks    PT Treatment/Interventions  ADLs/Self Care Home Management;Cryotherapy;Electrical Stimulation;Iontophoresis 63m/ml Dexamethasone;Moist Heat;Traction;Ultrasound;Neuromuscular re-education;Therapeutic activities;Therapeutic exercise;Manual techniques;Taping;Dry needling;Passive range of motion;Patient/family education;Spinal Manipulations;Gait training;Stair training    PT Next Visit Plan  review/ update HEP, potential SIJ involvement (posteriorly) hamstring stretching/ SLR and MET techniques, potential disc involvement, trial repeated extension in standing (doesn't tolerate prone well), modalities PRN; assess tolerance to manual therapy and new exercises. Adavance core strengthening as tolerated.     PT Home Exercise Plan  lower trunk rotation, hamstring stretching, SLR, posterior pelvic tilt    Consulted and Agree with Plan of Care  Patient       Patient will benefit from skilled therapeutic intervention in order to improve the following deficits and impairments:  Obesity, Pain, Increased fascial restricitons, Increased muscle spasms, Abnormal gait, Decreased  endurance, Decreased activity tolerance, Decreased balance, Decreased range of motion, Postural dysfunction, Improper body mechanics  Visit Diagnosis: Chronic bilateral low back pain, unspecified whether sciatica present  Other abnormalities of gait and mobility  Abnormal posture  Muscle spasm of back     Problem List Patient Active Problem List   Diagnosis Date Noted  . Chronic bilateral low back pain 12/13/2017  . Posterior tibial tendonitis, right 12/13/2017  . Class 3 severe obesity due to excess calories with serious comorbidity and body mass index (BMI) of 40.0 to 44.9 in adult ([place] 12/13/2017  . Chest mass 05/30/2017  . S/P left rotator cuff repair 10/25/2016  . Severe obesity (BMI >= 40) ([place] 09/27/2016  . Dilated aortic root ([place] 07/20/2016  . Claudication ([place] 07/20/2016  . Heart murmur 07/20/2016  . Trigger finger, left middle finger 06/21/2016  . Diabetes ([place] 05/24/2016  . Depression, major, recurrent, moderate ([place] 11/05/2015  . Chronic post-traumatic stress disorder (PTSD) 11/05/2015    Class: Chronic  . OSA (obstructive sleep apnea)   . Persistent atrial fibrillation 08/19/2015  . CAD (coronary artery disease), native coronary artery 08/19/2015  . Shoulder pain 06/11/2015  .  Hyperlipidemia 06/10/2015  . Tobacco use disorder 05/28/2015  . Essential hypertension 05/27/2015  . Bipolar disorder ([place]) 05/27/2015  . Hypertrophic cardiomyopathy ([place]) 05/27/2015    Carney Living  PT DPT  01/25/2018, 1:59 PM  [facility] [address] [place], [place], [address] Phone: [phone]   Fax:  [phone]  Name: Tyrelle Raczka MRN: [id number] Date of Birth: 28-Oct-1965

## 2018-01-30 ENCOUNTER — Encounter: Payer: Self-pay | Admitting: Physical Therapy

## 2018-01-30 ENCOUNTER — Ambulatory Visit: Payer: Medicare HMO | Admitting: Physical Therapy

## 2018-01-30 DIAGNOSIS — M545 Low back pain, unspecified: Secondary | ICD-10-CM

## 2018-01-30 DIAGNOSIS — G8929 Other chronic pain: Secondary | ICD-10-CM | POA: Diagnosis not present

## 2018-01-30 DIAGNOSIS — R2689 Other abnormalities of gait and mobility: Secondary | ICD-10-CM

## 2018-01-30 DIAGNOSIS — M62830 Muscle spasm of back: Secondary | ICD-10-CM

## 2018-01-30 DIAGNOSIS — R293 Abnormal posture: Secondary | ICD-10-CM | POA: Diagnosis not present

## 2018-01-30 NOTE — Therapy (Signed)
[place], [place], [address] Phone: [phone]   Fax:  [phone]  Physical Therapy Treatment  Patient Details  Name: Kyle Peterson MRN: [id number] Date of Birth: Nov 29, 1965 Referring Provider (PT): Rodell Perna MD   Encounter Date: 01/30/2018  PT End of Session - 01/30/18 1012    Visit Number  5    Number of Visits  11    Authorization Type  MCR with MCD secondary    PT Start Time  0931    PT Stop Time  1018    PT Time Calculation (min)  47 min    Activity Tolerance  Patient tolerated treatment well    Behavior During Therapy  [facility] for tasks assessed/performed       Past Medical History:  Diagnosis Date  . AICD (automatic cardioverter/defibrillator) present    bostan scien  . Anginal pain ([place])    ?heart pain   per dr Ileana Ladd  . Anxiety   . Arthritis    ddd  . CAD (coronary artery disease), native coronary artery 08/19/2015  . CHF (congestive heart failure) ([place])   . Depression   . Diabetes mellitus without complication ([place])    no meds  new dx  . Heart murmur   . High cholesterol   . Hypertension   . Myocardial infarction ([place])   . OSA (obstructive sleep apnea)    Severe with AHI 80/hr- new bipap  . PAF (paroxysmal atrial fibrillation) ([place]) 08/19/2015  . PTSD (post-traumatic stress disorder)   . Stroke ([place]) 2016  . Substance abuse [facility])     Past Surgical History:  Procedure Laterality Date  . CARDIAC CATHETERIZATION     01/13/14 [facility]: LM NL, mild narrowing of proximal/mid LAD, mid LCX, proximal RCA. 30% ostial OM1. 40% distal RCA. Medical tx.   . CARPAL TUNNEL RELEASE Left 11/02/2015  . CERVICAL DISCECTOMY    . MYOMECTOMY    . SHOULDER ARTHROSCOPY WITH ROTATOR CUFF REPAIR Left 09/09/2016   Procedure: LEFT SHOULDER ARTHROSCOPY WITH MINI OPEN ROTATOR CUFF REPAIR;  Surgeon: Marybelle Killings, MD;  Location: [place];  Service: Orthopedics;  Laterality: Left;  . TONSILLECTOMY       There were no vitals filed for this visit.  Subjective Assessment - 01/30/18 0935    Subjective  "I was talking with last session, the exercises help with the muscles reducing the tightness. I          [facility] PT Assessment - 01/30/18 0001      Assessment   Medical Diagnosis  low back pain     Referring Provider (PT)  Rodell Perna MD                   [facility] Adult PT Treatment/Exercise - 01/30/18 0001      Lumbar Exercises: Stretches   Active Hamstring Stretch Limitations  seated 2x30 sec each side     Lower Trunk Rotation  --   1 x 10 holding end range x 2 seconds     Lumbar Exercises: Standing   Other Standing Lumbar Exercises  reapeated trunk extension 2 x 20 (reported inital centralization but then reported peripherlization so halted activity)      Lumbar Exercises: Seated   Other Seated Lumbar Exercises  posterior pelvic tilt 2x10, horizongla lift/ chop with red theraband, and palloff press 2 x 10      Lumbar Exercises: Supine   Bent Knee Raise  10 reps;2 seconds  alternating LE holding    Dead Bug  5 reps;5 seconds    Dead Bug Limitations  with sustained posterior pelvic tilt      Modalities   Modalities  Traction      Traction   Type of Traction  Lumbar    Min (lbs)  130    Max (lbs)  140    Hold Time  60    Rest Time  20    Time  10               PT Short Term Goals - 01/25/18 0913      PT SHORT TERM GOAL #1   Title  pt to be I with initial HEP    Baseline  perfroming HEP     Time  3    Period  Weeks    Status  Achieved      PT SHORT TERM GOAL #2   Title  pt to verbalize and demo proper posture and lifting mechanics to reduce and prevent low back pain     Baseline  has the knowledge of good posture for lifting. Has not been doing a lot of lifting lately.     Time  3    Period  Weeks    Status  Achieved      PT SHORT TERM GOAL #3   Title  pt to demonstrate reduce low back muscle spasm to promote trunk mobility and reduce  pain to </= 4/10     Baseline  4/10 pain today with trunk mobility but can reach a 10/10     Time  3    Period  Weeks    Status  On-going        PT Long Term Goals - 01/25/18 2951      PT LONG TERM GOAL #1   Title  increase trunk flexion to >/= 70 degrees and bil sidebending to>/= 20 degrees with </= 2/10 pain for funtional mobility required for ADLs    Baseline  flexion 40 degrees r sidebending  , L sidebending     Time  6    Period  Weeks    Status  On-going      PT LONG TERM GOAL #2   Title  pt to be abel to sit/ stand and walk for >/= 60 min for personal goal of returning to exercise reporting </= 1/10 pain    Baseline  Pain still increases     Time  6    Period  Weeks    Status  On-going            Plan - 01/30/18 1012    Clinical Impression Statement  pt reports continued pain inthe low back with intermittent referral down the legs. trialed repeated extension which he noted inital centralization which was followed with peripherlization, halted extensions and trialed mechanical traciton which he noted tightness in the back but improvement with referral down the LE's.     PT Treatment/Interventions  ADLs/Self Care Home Management;Cryotherapy;Electrical Stimulation;Iontophoresis '4mg'$ /ml Dexamethasone;Moist Heat;Traction;Ultrasound;Neuromuscular re-education;Therapeutic activities;Therapeutic exercise;Manual techniques;Taping;Dry needling;Passive range of motion;Patient/family education;Spinal Manipulations;Gait training;Stair training    PT Next Visit Plan  review/ update HEP, potential SIJ involvement (posteriorly) hamstring stretching/ SLR and MET techniques, potential disc involvement, trial repeated extension in standing (doesn't tolerate prone well), modalities PRN; assess tolerance to manual therapy and new exercises. Adavance core strengthening as tolerated.     PT Home Exercise Plan  lower trunk rotation, hamstring stretching, SLR, posterior  pelvic tilt    Consulted and  Agree with Plan of Care  Patient       Patient will benefit from skilled therapeutic intervention in order to improve the following deficits and impairments:  Obesity, Pain, Increased fascial restricitons, Increased muscle spasms, Abnormal gait, Decreased endurance, Decreased activity tolerance, Decreased balance, Decreased range of motion, Postural dysfunction, Improper body mechanics  Visit Diagnosis: Chronic bilateral low back pain, unspecified whether sciatica present  Other abnormalities of gait and mobility  Abnormal posture  Muscle spasm of back     Problem List Patient Active Problem List   Diagnosis Date Noted  . Chronic bilateral low back pain 12/13/2017  . Posterior tibial tendonitis, right 12/13/2017  . Class 3 severe obesity due to excess calories with serious comorbidity and body mass index (BMI) of 40.0 to 44.9 in adult ([place]) 12/13/2017  . Chest mass 05/30/2017  . S/P left rotator cuff repair 10/25/2016  . Severe obesity (BMI >= 40) ([place]) 09/27/2016  . Dilated aortic root ([place]) 07/20/2016  . Claudication ([place]) 07/20/2016  . Heart murmur 07/20/2016  . Trigger finger, left middle finger 06/21/2016  . Diabetes ([place]) 05/24/2016  . Depression, major, recurrent, moderate ([place]) 11/05/2015  . Chronic post-traumatic stress disorder (PTSD) 11/05/2015    Class: Chronic  . OSA (obstructive sleep apnea)   . Persistent atrial fibrillation 08/19/2015  . CAD (coronary artery disease), native coronary artery 08/19/2015  . Shoulder pain 06/11/2015  . Hyperlipidemia 06/10/2015  . Tobacco use disorder 05/28/2015  . Essential hypertension 05/27/2015  . Bipolar disorder ([place]) 05/27/2015  . Hypertrophic cardiomyopathy ([place]) 05/27/2015   Starr Lake PT, DPT, LAT, ATC  01/30/18  10:15 AM      [facility] [address] [place], [place], [address] Phone: [phone]   Fax:  [phone]  Name: Kyle Peterson MRN:  [id number] Date of Birth: 09/23/1965

## 2018-02-05 NOTE — Progress Notes (Signed)
Cardiology Office Note:    Date:  02/06/2018   ID:  Kyle ArnJon Haymer, DOB Jul 27, 1965, MRN [id number]  PCP:  Carney Livinghambliss, Marshall L, MD  Cardiologist:  Regan LemmingWill Martin Camnitz, MD    Referring MD: Carney Livinghambliss, Marshall L, *   Chief Complaint  Patient presents with  . Sleep Apnea  . Coronary Artery Disease  . Hypertension  . Atrial Fibrillation  . Hyperlipidemia    History of Present Illness:    Kyle Peterson is a 53 y.o. male with a hx of severe OSA with an AHI of 80.8/hr and underwent CPAP titration but due to ongoing events could not be titrated on CPAP and ultimately was placed on BiPAP at 20/16cm H2O.  He also has a history ofnonobstructiveCAD, PAF, HTN, HOCM s/p septal myectomy s/p AICD, hyperlipidemia, polysubstance abuse with heroin/cocaine and ETOH for 25 years but has been clean for over 10 years.  He is here today for followup and is doing well.  He denies any chest pain or pressure, SOB, DOE, PND, orthopnea, LE edema, dizziness, palpitations or syncope. He is compliant with his meds and is tolerating meds with no SE.  He is doing well with his PAP device and thinks that he has gotten used to it.  He tolerates the mask and feels the pressure is adequate.  Since going on PAP he feels rested in the am and has no significant daytime sleepiness.  He denies any significant mouth or nasal dryness or nasal congestion.  He does not think that he snores.    Past Medical History:  Diagnosis Date  . AICD (automatic cardioverter/defibrillator) present    bostan scien  . Anginal pain (HCC)    ?heart pain   per dr Estill Doomscamntiz  . Anxiety   . Arthritis    ddd  . CAD (coronary artery disease), native coronary artery 08/19/2015  . CHF (congestive heart failure) (HCC)   . Depression   . Diabetes mellitus without complication (HCC)    no meds  new dx  . Heart murmur   . High cholesterol   . Hypertension   . Myocardial infarction (HCC)   . OSA (obstructive sleep apnea)    Severe with AHI 80/hr- new bipap    . PAF (paroxysmal atrial fibrillation) (HCC) 08/19/2015  . PTSD (post-traumatic stress disorder)   . Stroke (HCC) 2016  . Substance abuse [facility](HCC)     Past Surgical History:  Procedure Laterality Date  . CARDIAC CATHETERIZATION     01/13/14 [facility]: LM NL, mild narrowing of proximal/mid LAD, mid LCX, proximal RCA. 30% ostial OM1. 40% distal RCA. Medical tx.   . CARPAL TUNNEL RELEASE Left 11/02/2015  . CERVICAL DISCECTOMY    . MYOMECTOMY    . SHOULDER ARTHROSCOPY WITH ROTATOR CUFF REPAIR Left 09/09/2016   Procedure: LEFT SHOULDER ARTHROSCOPY WITH MINI OPEN ROTATOR CUFF REPAIR;  Surgeon: Eldred MangesYates, Mark C, MD;  Location: [facility] OR;  Service: Orthopedics;  Laterality: Left;  . TONSILLECTOMY      Current Medications: Current Meds  Medication Sig  . atorvastatin (LIPITOR) 40 MG tablet Take 1 tablet (40 mg total) by mouth daily.  Marland Kitchen. diltiazem (CARDIZEM CD) 240 MG 24 hr capsule TAKE 1 CAPSULE EVERY DAY  . furosemide (LASIX) 20 MG tablet TAKE 1 TABLET EVERY DAY  . losartan (COZAAR) 50 MG tablet TAKE 1 TABLET EVERY DAY  . metoprolol succinate (TOPROL-XL) 50 MG 24 hr tablet TAKE 1 TABLET  DAILY WITH OR IMMEDIATELY FOLLOWING A MEAL.  . orphenadrine (NORFLEX)  100 MG tablet Take 100 mg by mouth as needed for muscle spasms.  Marland Kitchen oxyCODONE-acetaminophen (PERCOCET) 10-325 MG tablet Take 1 tablet by mouth every 4 (four) hours as needed for pain.  Marland Kitchen XARELTO 20 MG TABS tablet TAKE 1 TABLET EVERY DAY WITH SUPPER     Allergies:   Ace inhibitors   Social History   Socioeconomic History  . Marital status: Divorced    Spouse name: Not on file  . Number of children: Not on file  . Years of education: Not on file  . Highest education level: Not on file  Occupational History  . Not on file  Social Needs  . Financial resource strain: Not on file  . Food insecurity:    Worry: Not on file    Inability: Not on file  . Transportation needs:    Medical: Not on file    Non-medical: Not on file  Tobacco  Use  . Smoking status: Current Every Day Smoker    Packs/day: 0.50    Years: 40.00    Pack years: 20.00    Types: Cigarettes  . Smokeless tobacco: Never Used  . Tobacco comment: Trying to cut back   Substance and Sexual Activity  . Alcohol use: No    Alcohol/week: 0.0 standard drinks  . Drug use: Yes    Frequency: 3.0 times per week    Types: Marijuana    Comment: last 07/26/16  . Sexual activity: Not Currently  Lifestyle  . Physical activity:    Days per week: Not on file    Minutes per session: Not on file  . Stress: Not on file  Relationships  . Social connections:    Talks on phone: Not on file    Gets together: Not on file    Attends religious service: Not on file    Active member of club or organization: Not on file    Attends meetings of clubs or organizations: Not on file    Relationship status: Not on file  Other Topics Concern  . Not on file  Social History Narrative   Recently moved to [facility]   Father is in [place].   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  ROS:   Please see the history of present illness.    ROS  All other systems reviewed and negative.   EKGs/Labs/Other Studies Reviewed:    The following studies were reviewed today: PAP download  EKG:  EKG is not ordered today.    Recent Labs: 07/28/2017: BUN 15; Creatinine, Ser 1.10; Hemoglobin 16.6; Platelets 225; Potassium 4.1; Sodium 143 10/16/2017: ALT 18   Recent Lipid Panel    Component Value Date/Time   CHOL 116 10/16/2017 0732   TRIG 116 10/16/2017 0732   HDL 35 (L) 10/16/2017 0732   CHOLHDL 3.3 10/16/2017 0732   CHOLHDL 5.6 (H) 06/10/2015 0924   VLDL 40 (H) 06/10/2015 0924   LDLCALC 58 10/16/2017 0732    Physical Exam:     VS:  BP (!) 160/94   Pulse 66   Ht 5\' 8"  (1.727 m)   Wt 292 lb (132.5 kg)   SpO2 98%   BMI 44.40 kg/m     Wt Readings from Last  3 Encounters:  02/06/18 292 lb (132.5 kg)  12/13/17 285 lb (129.3 kg)  07/20/17 289 lb 12.8 oz (131.5 kg)     GEN:  Well nourished, well developed in no acute distress HEENT: Normal NECK: No JVD; No carotid bruits LYMPHATICS: No lymphadenopathy CARDIAC: RRR, no murmurs, rubs, gallops RESPIRATORY:  Clear to auscultation without rales, wheezing or rhonchi  ABDOMEN: Soft, non-tender, non-distended MUSCULOSKELETAL:  No edema; No deformity  SKIN: Warm and dry NEUROLOGIC:  Alert and oriented x 3 PSYCHIATRIC:  Normal affect   ASSESSMENT:    1. Coronary artery disease involving native coronary artery of native heart without angina pectoris   2. Persistent atrial fibrillation   3. Hypertrophic cardiomyopathy (HCC)   4. Essential hypertension   5. Dilated aortic root (HCC)   6. OSA (obstructive sleep apnea)   7. Pure hypercholesterolemia    PLAN:    In order of problems listed above:  1.  [facility] - [facility] showed LM NL, mild narrowing of proximal/mid LAD, mid LCX, proximal RCA. 30% ostial OM1. 40% distal RCA  He denies any anginal sx.  He will continue on BB and statin.  He is not on ASA due to DOAC.  2.  Persistent atrial Fibrillation - He is maintaining NSR on exam today.  He will continue on Xarelto 20mg  daily, Toprol XL 50mg  daily and Cardizem CD 240mg  daily.  He denies any bleeding problems.  His Hbg was 16.6 in June 2019 and creatinine 1.1.    3.  HOCM - s/p septal myomectomy.  4.  HTN - his BP is elevated on exam today.  He will continue on Cardizem CD 240mg  daily, Toprol XL 50mg  daily and increase  Losartan to 100mg  daily.  I will check a BMET in 1 week.  5.  Dilated aortic root - Aortic root was 37mm by echo 2018. I will repeat echo to reassess.  6.  OSA - the patient is tolerating PAP therapy well without any  problems. The PAP download was reviewed today and showed an AHI of 1.8/hr on 20/16 cm H2O with 97% compliance in using more than 4 hours nightly.  The patient has been using and benefiting from PAP use and will continue to benefit from therapy.   7.  Hyperlipidemia - LDL goal is < 70.  His LDL was 58 on 10/16/2017.  He will continue on atorvastatin 40mg  daily.     Medication Adjustments/Labs and Tests Ordered: Current medicines are reviewed at length with the patient today.  Concerns regarding medicines are outlined above.  No orders of the defined types were placed in this encounter.  No orders of the defined types were placed in this encounter.   Signed, Armanda Magicraci Quantavis Obryant, MD  02/06/2018 9:16 AM    [facility]

## 2018-02-06 ENCOUNTER — Ambulatory Visit (INDEPENDENT_AMBULATORY_CARE_PROVIDER_SITE_OTHER): Payer: Medicare HMO | Admitting: Cardiology

## 2018-02-06 ENCOUNTER — Ambulatory Visit: Payer: Medicare HMO | Admitting: Physical Therapy

## 2018-02-06 ENCOUNTER — Encounter: Payer: Self-pay | Admitting: Cardiology

## 2018-02-06 VITALS — BP 160/94 | HR 66 | Ht 68.0 in | Wt 292.0 lb

## 2018-02-06 DIAGNOSIS — I422 Other hypertrophic cardiomyopathy: Secondary | ICD-10-CM

## 2018-02-06 DIAGNOSIS — E7800 Pure hypercholesterolemia, unspecified: Secondary | ICD-10-CM

## 2018-02-06 DIAGNOSIS — I4819 Other persistent atrial fibrillation: Secondary | ICD-10-CM | POA: Diagnosis not present

## 2018-02-06 DIAGNOSIS — I77810 Thoracic aortic ectasia: Secondary | ICD-10-CM | POA: Diagnosis not present

## 2018-02-06 DIAGNOSIS — G4733 Obstructive sleep apnea (adult) (pediatric): Secondary | ICD-10-CM | POA: Diagnosis not present

## 2018-02-06 DIAGNOSIS — I2510 Atherosclerotic heart disease of native coronary artery without angina pectoris: Secondary | ICD-10-CM

## 2018-02-06 DIAGNOSIS — I10 Essential (primary) hypertension: Secondary | ICD-10-CM | POA: Diagnosis not present

## 2018-02-06 MED ORDER — LOSARTAN POTASSIUM 100 MG PO TABS: 100.0000 mg | ORAL_TABLET | Freq: Every day | ORAL | 3 refills | Status: DC

## 2018-02-06 NOTE — Patient Instructions (Signed)
Medication Instructions:  Increase: Losartan 100 mg, daily  If you need a refill on your cardiac medications before your next appointment, please call your pharmacy.   Lab work: Future: 1/15: BMET  If you have labs (blood work) drawn today and your tests are completely normal, you will receive your results only by: Marland Kitchen MyChart Message (if you have MyChart) OR . A paper copy in the mail If you have any lab test that is abnormal or we need to change your treatment, we will call you to review the results.  Testing/Procedures: Your physician has requested that you have an echocardiogram. Echocardiography is a painless test that uses sound waves to create images of your heart. It provides your doctor with information about the size and shape of your heart and how well your heart's chambers and valves are working. This procedure takes approximately one hour. There are no restrictions for this procedure.  Follow-Up: At [facility], you and your health needs are our priority.  As part of our continuing mission to provide you with exceptional heart care, we have created designated Provider Care Teams.  These Care Teams include your primary Cardiologist (physician) and Advanced Practice Providers (APPs -  Physician Assistants and Nurse Practitioners) who all work together to provide you with the care you need, when you need it. You will need a follow up appointment in 6 months.  Please call our office 2 months in advance to schedule this appointment.  You may see Dr. Mayford Knife or one of the following Advanced Practice Providers on your designated Care Team:   [place], PA-C Ronie Spies, PA-C . Jacolyn Reedy, PA-C  Check Blood pressure for 1 week, 2 hours after taking blood pressure medication.

## 2018-02-12 ENCOUNTER — Encounter: Payer: Self-pay | Admitting: Physical Therapy

## 2018-02-12 ENCOUNTER — Ambulatory Visit: Payer: Medicare HMO | Attending: Orthopaedic Surgery | Admitting: Physical Therapy

## 2018-02-12 VITALS — BP 142/94 | HR 75

## 2018-02-12 DIAGNOSIS — R2689 Other abnormalities of gait and mobility: Secondary | ICD-10-CM | POA: Diagnosis not present

## 2018-02-12 DIAGNOSIS — M62830 Muscle spasm of back: Secondary | ICD-10-CM | POA: Diagnosis not present

## 2018-02-12 DIAGNOSIS — R293 Abnormal posture: Secondary | ICD-10-CM | POA: Diagnosis not present

## 2018-02-12 DIAGNOSIS — M545 Low back pain, unspecified: Secondary | ICD-10-CM

## 2018-02-12 DIAGNOSIS — G8929 Other chronic pain: Secondary | ICD-10-CM | POA: Insufficient documentation

## 2018-02-12 NOTE — Therapy (Signed)
[place], [place], [address] Phone: [phone]   Fax:  [phone]  Physical Therapy Treatment / Re-certification   Patient Details  Name: Kyle Peterson MRN: [id number] Date of Birth: 11-Apr-1965 Referring Provider (PT): Rodell Perna MD   Encounter Date: 02/12/2018  PT End of Session - 02/12/18 0920    Visit Number  6    Number of Visits  12    Date for PT Re-Evaluation  03/26/18    Authorization Type  MCR with MCD secondary    PT Start Time  0920    PT Stop Time  1008    PT Time Calculation (min)  48 min    Activity Tolerance  Patient tolerated treatment well    Behavior During Therapy  [facility] for tasks assessed/performed       Past Medical History:  Diagnosis Date  . AICD (automatic cardioverter/defibrillator) present    bostan scien  . Anginal pain ([place])    ?heart pain   per dr Ileana Ladd  . Anxiety   . Arthritis    ddd  . CAD (coronary artery disease), native coronary artery 08/19/2015  . CHF (congestive heart failure) ([place])   . Depression   . Diabetes mellitus without complication ([place])    no meds  new dx  . Heart murmur   . High cholesterol   . Hypertension   . Myocardial infarction ([place])   . OSA (obstructive sleep apnea)    Severe with AHI 80/hr- new bipap  . PAF (paroxysmal atrial fibrillation) ([place]) 08/19/2015  . PTSD (post-traumatic stress disorder)   . Stroke ([place]) 2016  . [facility]     Past Surgical History:  Procedure Laterality Date  . CARDIAC CATHETERIZATION     [facility]/13/[facility]: LM NL, mild narrowing of proximal/mid LAD, mid LCX, proximal RCA. 30% ostial OM1. 40% distal RCA. Medical tx.   . CARPAL TUNNEL RELEASE Left 11/02/2015  . CERVICAL DISCECTOMY    . MYOMECTOMY    . SHOULDER ARTHROSCOPY WITH ROTATOR CUFF REPAIR Left 09/09/2016   Procedure: LEFT SHOULDER ARTHROSCOPY WITH MINI OPEN ROTATOR CUFF REPAIR;  Surgeon: Marybelle Killings, MD;  Location: [place];  Service:  Orthopedics;  Laterality: Left;  . TONSILLECTOMY      Vitals:   02/12/18 0918  BP: (!) 142/94  Pulse: 75    Subjective Assessment - 02/12/18 0918    Subjective  "I feel bad all the time, my blood pressure feels like its off since they jacked up my Losartan. The back is okay today"     Currently in Pain?  Yes    Pain Score  3     Pain Location  Back    Pain Orientation  Right;Left;Lower    Pain Descriptors / Indicators  Aching;Sore    Pain Type  Chronic pain    Pain Onset  More than a month ago    Pain Frequency  Intermittent    Aggravating Factors   prolonged sitting, standing    Pain Relieving Factors  medication, stretching, hot shower    Effect of Pain on Daily Activities  limited mobility          [facility] PT Assessment - 02/12/18 0001      AROM   Lumbar Flexion  70    Lumbar Extension  40    Lumbar - Right Side Bend  24    Lumbar - Left Side Bend  30  Strength   Right Hip Flexion  5/5    Right Hip Extension  4+/5    Right Hip ABduction  4+/5    Right Hip ADduction  5/5    Left Hip Flexion  5/5    Left Hip ABduction  4+/5    Left Hip ADduction  5/5    Right Knee Flexion  5/5    Right Knee Extension  5/5    Left Knee Flexion  5/5    Left Knee Extension  5/5                   [facility] Adult PT Treatment/Exercise - 02/12/18 0001      Lumbar Exercises: Standing   Other Standing Lumbar Exercises  hip abduction / extension holding onto green physioball  on table.     Other Standing Lumbar Exercises  pushing down bil UE 2 x 10 forward, 2 x 10 to the R/L       Lumbar Exercises: Seated   Other Seated Lumbar Exercises  sitting on physioabll marching 2 x 20 , 1 x 10 with contralateral UE flexion       Lumbar Exercises: Supine   Bent Knee Raise  10 reps;2 seconds      Manual Therapy   Manual therapy comments  MTPR along bil lumbar paraspinals   with pt in sidelying            PT Education - 02/12/18 1011    Education Details  emphasis on  pain education regarding tissues heal and that all backs have a curve which is normal.     Person(s) Educated  Patient    Methods  Explanation;Verbal cues    Comprehension  Verbalized understanding       PT Short Term Goals - 02/12/18 1006      PT SHORT TERM GOAL #1   Title  pt to be I with initial HEP    Time  3    Period  Weeks    Status  Achieved      PT SHORT TERM GOAL #2   Title  pt to verbalize and demo proper posture and lifting mechanics to reduce and prevent low back pain     Time  3    Period  Weeks    Status  Achieved      PT SHORT TERM GOAL #3   Title  pt to demonstrate reduce low back muscle spasm to promote trunk mobility and reduce pain to </= 4/10     Time  3    Period  Weeks    Status  Achieved        PT Long Term Goals - 02/12/18 1005      PT LONG TERM GOAL #1   Title  increase trunk flexion to >/= 70 degrees and bil sidebending to>/= 20 degrees with </= 2/10 pain for funtional mobility required for ADLs    Baseline  met ROM goals but continued pain at 4/10    Time  6    Period  Weeks    Status  Partially Met      PT LONG TERM GOAL #2   Title  pt to be abel to sit/ stand and walk for >/= 60 min for personal goal of returning to exercise reporting </= 1/10 pain    Baseline  limited walking/ standing due to pain     Time  6    Period  Weeks    Status  On-going      PT LONG TERM GOAL #3   Title  increase ODI by </= 10% to demo improvement in function     Time  6    Period  Weeks    Status  Unable to assess      PT LONG TERM GOAL #4   Title  pt to be I with all HEP given as of last visit to maintain and progress current level of function     Baseline  independent with current HEp and progressing as tolerated    Time  6    Period  Weeks            Plan - 02/12/18 1007    Clinical Impression Statement  pt reports continued pain in the low back ranging from 2-4/10 depending on activity. He is improving trunk mobility and hip strength. He is  making progress with LTG's appropriately. Discussed DN which he wanted to try it next visit. continued focus on hip / core strengthening in standing which he performed well reporting his back felt better. plan to continue treatment 1 x a week for the next 6 weeks working on core strength, maximize stability and maximize overall function    PT Frequency  1x / week    PT Duration  6 weeks    PT Treatment/Interventions  ADLs/Self Care Home Management;Cryotherapy;Electrical Stimulation;Iontophoresis 54m/ml Dexamethasone;Moist Heat;Traction;Ultrasound;Neuromuscular re-education;Therapeutic activities;Therapeutic exercise;Manual techniques;Taping;Dry needling;Passive range of motion;Patient/family education;Spinal Manipulations;Gait training;Stair training    PT Next Visit Plan  DN on the next session ,SLR and MET techniques, potential disc involvement, modalities PRN; assess tolerance to manual therapy and new exercises. Adavance core strengthening as tolerated.     PT Home Exercise Plan  lower trunk rotation, hamstring stretching, SLR, posterior pelvic tilt    Consulted and Agree with Plan of Care  Patient       Patient will benefit from skilled therapeutic intervention in order to improve the following deficits and impairments:  Obesity, Pain, Increased fascial restricitons, Increased muscle spasms, Abnormal gait, Decreased endurance, Decreased activity tolerance, Decreased balance, Decreased range of motion, Postural dysfunction, Improper body mechanics  Visit Diagnosis: Chronic bilateral low back pain, unspecified whether sciatica present  Other abnormalities of gait and mobility  Abnormal posture  Muscle spasm of back     Problem List Patient Active Problem List   Diagnosis Date Noted  . Chronic bilateral low back pain 12/13/2017  . Posterior tibial tendonitis, right 12/13/2017  . Class 3 severe obesity due to excess calories with serious comorbidity and body mass index (BMI) of 40.0 to  44.9 in adult ([place] 12/13/2017  . Chest mass 05/30/2017  . S/P left rotator cuff repair 10/25/2016  . Severe obesity (BMI >= 40) ([place] 09/27/2016  . Dilated aortic root ([place] 07/20/2016  . Claudication ([place] 07/20/2016  . Heart murmur 07/20/2016  . Trigger finger, left middle finger 06/21/2016  . Diabetes ([place] 05/24/2016  . Depression, major, recurrent, moderate ([place] 11/05/2015  . Chronic post-traumatic stress disorder (PTSD) 11/05/2015    Class: Chronic  . OSA (obstructive sleep apnea)   . Persistent atrial fibrillation 08/19/2015  . CAD (coronary artery disease), native coronary artery 08/19/2015  . Shoulder pain 06/11/2015  . Hyperlipidemia 06/10/2015  . Tobacco use disorder 05/28/2015  . Essential hypertension 05/27/2015  . Bipolar disorder ([place] 05/27/2015  . Hypertrophic cardiomyopathy ([place] 05/27/2015   KStarr LakePT, DPT, LAT, ATC  02/12/18  10:14 AM      [place]  [place], [place], [address] Phone: [phone]   Fax:  [phone]  Name: Kyle Peterson MRN: [id number] Date of Birth: 1965/06/06

## 2018-02-13 LAB — CUP PACEART REMOTE DEVICE CHECK
Battery Remaining Longevity: 144 mo
Battery Remaining Percentage: 100 %
Brady Statistic RV Percent Paced: 0 %
Date Time Interrogation Session: 20191118073000
HighPow Impedance: 67 Ohm
Implantable Lead Implant Date: 20160218
Implantable Lead Location: 753860
Implantable Lead Model: 292
Implantable Lead Serial Number: 358834
Lead Channel Impedance Value: 739 Ohm
Lead Channel Pacing Threshold Amplitude: 0.7 V
Lead Channel Pacing Threshold Pulse Width: 0.5 ms
Lead Channel Setting Sensing Sensitivity: 0.3 mV
MDC IDC PG IMPLANT DT: 20160218
MDC IDC SET LEADCHNL RV PACING AMPLITUDE: 2 V
MDC IDC SET LEADCHNL RV PACING PULSEWIDTH: 0.5 ms
Pulse Gen Serial Number: 202913

## 2018-02-14 ENCOUNTER — Other Ambulatory Visit: Payer: Medicaid Other

## 2018-02-14 ENCOUNTER — Other Ambulatory Visit (HOSPITAL_COMMUNITY): Payer: Medicaid Other

## 2018-02-20 ENCOUNTER — Other Ambulatory Visit: Payer: Self-pay

## 2018-02-21 ENCOUNTER — Other Ambulatory Visit: Payer: Self-pay

## 2018-02-21 ENCOUNTER — Other Ambulatory Visit: Payer: Medicaid Other | Admitting: *Deleted

## 2018-02-21 ENCOUNTER — Ambulatory Visit (HOSPITAL_COMMUNITY): Payer: Medicare HMO | Attending: Cardiology

## 2018-02-21 DIAGNOSIS — I77810 Thoracic aortic ectasia: Secondary | ICD-10-CM

## 2018-02-21 DIAGNOSIS — I4819 Other persistent atrial fibrillation: Secondary | ICD-10-CM | POA: Diagnosis not present

## 2018-02-21 DIAGNOSIS — I2510 Atherosclerotic heart disease of native coronary artery without angina pectoris: Secondary | ICD-10-CM | POA: Diagnosis not present

## 2018-02-22 LAB — BASIC METABOLIC PANEL
BUN/Creatinine Ratio: 9 (ref 9–20)
BUN: 10 mg/dL (ref 6–24)
CO2: 24 mmol/L (ref 20–29)
Calcium: 9.3 mg/dL (ref 8.7–10.2)
Chloride: 99 mmol/L (ref 96–106)
Creatinine, Ser: 1.16 mg/dL (ref 0.76–1.27)
GFR calc non Af Amer: 73 mL/min/{1.73_m2} (ref >59)
GFR, EST AFRICAN AMERICAN: 84 mL/min/{1.73_m2} (ref >59)
Glucose: 135 mg/dL — ABNORMAL HIGH (ref 65–99)
Potassium: 4.5 mmol/L (ref 3.5–5.2)
Sodium: 141 mmol/L (ref 134–144)

## 2018-02-23 ENCOUNTER — Ambulatory Visit: Payer: Medicare HMO | Admitting: Physical Therapy

## 2018-02-23 ENCOUNTER — Other Ambulatory Visit: Payer: Self-pay | Admitting: *Deleted

## 2018-02-23 DIAGNOSIS — R2689 Other abnormalities of gait and mobility: Secondary | ICD-10-CM

## 2018-02-23 DIAGNOSIS — R293 Abnormal posture: Secondary | ICD-10-CM

## 2018-02-23 DIAGNOSIS — M62830 Muscle spasm of back: Secondary | ICD-10-CM | POA: Diagnosis not present

## 2018-02-23 DIAGNOSIS — M545 Low back pain, unspecified: Secondary | ICD-10-CM

## 2018-02-23 DIAGNOSIS — G8929 Other chronic pain: Secondary | ICD-10-CM

## 2018-02-23 NOTE — Patient Outreach (Signed)
[facility]) Care Management  02/23/2018  Indalecio Vajda 1965-03-31 [id number]   Telephone Screen  Referral Date:  02/20/2018 Referral Source:  [employer] Diabetic Reason for Referral:  [facility] Diabetic Eye Examination Insurance:  [facility] Medicare & Medicaid   Outreach Attempt:  Successful telephone outreach to patient for assistance arranging Diabetes Eye Examination and telephone screening.  HIPAA verified with patient.  Patient reporting he is unsure of his last eye examination and whether or not if an diabetic exam was completed at this time.  States he had his last examination at the [facility] [place].  RN Health Coach contacted [facility] at [phone] and spoke with staff at Dr. Francesca Oman (Optometrist) who states patient's last eye examination was 09/22/2016.  Offered to schedule new eye exam for patient, patient wishes to schedule himself.  RN Health Coach reviewed [facility] services with patient.  Patient declines screening and services at this time.  Does verbally agree for follow up call to confirm eye examination scheduling and results.  Offered [facility] Brochure and patient receptive to mailing and agrees to contact [facility] in the future if he feels he will benefit from services.  Plan: RN Health Coach will send patient [facility] Brochure/Pamphlet. RN Health Coach will send patient Diabetic Eye Exam Flyer RN Health Coach will contact patient in the month of February to confirm eye exam has been arranged and attended.  Rhae Lerner RN [facility] Care Management  RN Health Coach [phone] [email or web address]

## 2018-02-23 NOTE — Therapy (Signed)
[place], [place], [address] Phone: [phone]   Fax:  [phone]  Physical Therapy Treatment  Patient Details  Name: Kyle Peterson MRN: [id number] Date of Birth: 01/02/1966 Referring Provider (PT): Rodell Perna MD   Encounter Date: 02/23/2018  PT End of Session - 02/23/18 1054    Visit Number  7    Number of Visits  12    Date for PT Re-Evaluation  03/26/18    Authorization Type  MCR with MCD secondary    PT Start Time  0930    PT Stop Time  1014    PT Time Calculation (min)  44 min    Activity Tolerance  Patient tolerated treatment well    Behavior During Therapy  [facility] for tasks assessed/performed       Past Medical History:  Diagnosis Date  . AICD (automatic cardioverter/defibrillator) present    bostan scien  . Anginal pain ([place])    ?heart pain   per dr Ileana Ladd  . Anxiety   . Arthritis    ddd  . CAD (coronary artery disease), native coronary artery 08/19/2015  . CHF (congestive heart failure) ([place])   . Depression   . Diabetes mellitus without complication ([place])    no meds  new dx  . Heart murmur   . High cholesterol   . Hypertension   . Myocardial infarction ([place])   . OSA (obstructive sleep apnea)    Severe with AHI 80/hr- new bipap  . PAF (paroxysmal atrial fibrillation) ([place]) 08/19/2015  . PTSD (post-traumatic stress disorder)   . Stroke ([place]) 2016  . Substance abuse [facility])     Past Surgical History:  Procedure Laterality Date  . CARDIAC CATHETERIZATION     01/13/14 [facility]: LM NL, mild narrowing of proximal/mid LAD, mid LCX, proximal RCA. 30% ostial OM1. 40% distal RCA. Medical tx.   . CARPAL TUNNEL RELEASE Left 11/02/2015  . CERVICAL DISCECTOMY    . MYOMECTOMY    . SHOULDER ARTHROSCOPY WITH ROTATOR CUFF REPAIR Left 09/09/2016   Procedure: LEFT SHOULDER ARTHROSCOPY WITH MINI OPEN ROTATOR CUFF REPAIR;  Surgeon: Marybelle Killings, MD;  Location: [place];  Service: Orthopedics;   Laterality: Left;  . TONSILLECTOMY      There were no vitals filed for this visit.  Subjective Assessment - 02/23/18 1052    Subjective  Patiewnt feels sore today. He feels lik his back is about the same. He feels like he would like to try the needling but he is a little nervous.     How long can you sit comfortably?  45 min    How long can you stand comfortably?  45 min     How long can you walk comfortably?  45 min    Diagnostic tests  x-ray 12/13/2017 Normal lumbar x-rays with some facet arthritis worse at L4-5 and L5-S1.    Patient Stated Goals  to decrease pain, to get back to walking / exercise    Currently in Pain?  Yes    Pain Score  3     Pain Location  Back    Pain Orientation  Right;Left;Lower    Pain Descriptors / Indicators  Aching    Pain Type  Chronic pain    Pain Onset  More than a month ago    Pain Frequency  Constant    Aggravating Factors   prolonged sitting, standing     Pain Relieving Factors  medication, stretching  [facility] Adult PT Treatment/Exercise - 02/23/18 0001      Lumbar Exercises: Standing   Row  20 reps;Theraband    Theraband Level (Row)  Level 4 (Blue)    Shoulder Extension  20 reps;Theraband    Theraband Level (Shoulder Extension)  Level 4 (Blue)    Other Standing Lumbar Exercises  bicpes curls x20  5 lb reviewed how all exercises can be turned into core exercises.       Manual Therapy   Manual therapy comments  MTPR along bil lumbar paraspinals; skilled palpation of trigger points    with pt in sidelying   Soft tissue mobilization  IATYM in sidelying to bilateral paraspinals     Manual Traction  LAD 3x30sec hold bilateral        Trigger Point Dry Needling - 02/23/18 1142    Consent Given?  Yes    Muscles Treated Upper Body  Longissimus    Longissimus Response  Twitch response elicited   right L1 L2 L3 L5           PT Education - 02/23/18 1054    Education Details  benefits and risk of TPDN;      Person(s) Educated  Patient    Methods  Explanation;Demonstration;Tactile cues;Verbal cues    Comprehension  Verbalized understanding;Returned demonstration;Verbal cues required;Tactile cues required;Need further instruction       PT Short Term Goals - 02/12/18 1006      PT SHORT TERM GOAL #1   Title  pt to be I with initial HEP    Time  3    Period  Weeks    Status  Achieved      PT SHORT TERM GOAL #2   Title  pt to verbalize and demo proper posture and lifting mechanics to reduce and prevent low back pain     Time  3    Period  Weeks    Status  Achieved      PT SHORT TERM GOAL #3   Title  pt to demonstrate reduce low back muscle spasm to promote trunk mobility and reduce pain to </= 4/10     Time  3    Period  Weeks    Status  Achieved        PT Long Term Goals - 02/12/18 1005      PT LONG TERM GOAL #1   Title  increase trunk flexion to >/= 70 degrees and bil sidebending to>/= 20 degrees with </= 2/10 pain for funtional mobility required for ADLs    Baseline  met ROM goals but continued pain at 4/10    Time  6    Period  Weeks    Status  Partially Met      PT LONG TERM GOAL #2   Title  pt to be abel to sit/ stand and walk for >/= 60 min for personal goal of returning to exercise reporting </= 1/10 pain    Baseline  limited walking/ standing due to pain     Time  6    Period  Weeks    Status  On-going      PT LONG TERM GOAL #3   Title  increase ODI by </= 10% to demo improvement in function     Time  6    Period  Weeks    Status  Unable to assess      PT LONG TERM GOAL #4   Title  pt to be I with all  HEP given as of last visit to maintain and progress current level of function     Baseline  independent with current HEp and progressing as tolerated    Time  6    Period  Weeks            Plan - 02/23/18 1055    Clinical Impression Statement  Patient did notfeel much of a twitch resposne with the neddling. Tith his L1 paraspinal he felt a small twith.  Therapy reviewed how to reduce post needle soreness if he has it. He seems to be moving better and have better movement of his hips as well. Therapy advised the patient that he needs to be patient and continue to work on stretches and exercises.     Clinical Presentation  Evolving    Clinical Decision Making  Moderate    Rehab Potential  Good    PT Frequency  1x / week    PT Duration  6 weeks    PT Treatment/Interventions  ADLs/Self Care Home Management;Cryotherapy;Electrical Stimulation;Iontophoresis 26m/ml Dexamethasone;Moist Heat;Traction;Ultrasound;Neuromuscular re-education;Therapeutic activities;Therapeutic exercise;Manual techniques;Taping;Dry needling;Passive range of motion;Patient/family education;Spinal Manipulations;Gait training;Stair training    PT Next Visit Plan  DN on the next session ,SLR and MET techniques, potential disc involvement, modalities PRN; assess tolerance to manual therapy and new exercises. Adavance core strengthening as tolerated.     PT Home Exercise Plan  lower trunk rotation, hamstring stretching, SLR, posterior pelvic tilt    Consulted and Agree with Plan of Care  Patient       Patient will benefit from skilled therapeutic intervention in order to improve the following deficits and impairments:  Obesity, Pain, Increased fascial restricitons, Increased muscle spasms, Abnormal gait, Decreased endurance, Decreased activity tolerance, Decreased balance, Decreased range of motion, Postural dysfunction, Improper body mechanics  Visit Diagnosis: Chronic bilateral low back pain, unspecified whether sciatica present  Other abnormalities of gait and mobility  Abnormal posture  Muscle spasm of back     Problem List Patient Active Problem List   Diagnosis Date Noted  . Chronic bilateral low back pain 12/13/2017  . Posterior tibial tendonitis, right 12/13/2017  . Class 3 severe obesity due to excess calories with serious comorbidity and body mass index (BMI) of  40.0 to 44.9 in adult ([place] 12/13/2017  . Chest mass 05/30/2017  . S/P left rotator cuff repair 10/25/2016  . Severe obesity (BMI >= 40) ([place] 09/27/2016  . Dilated aortic root ([place] 07/20/2016  . Claudication ([place] 07/20/2016  . Heart murmur 07/20/2016  . Trigger finger, left middle finger 06/21/2016  . Diabetes ([place] 05/24/2016  . Depression, major, recurrent, moderate ([place] 11/05/2015  . Chronic post-traumatic stress disorder (PTSD) 11/05/2015    Class: Chronic  . OSA (obstructive sleep apnea)   . Persistent atrial fibrillation 08/19/2015  . CAD (coronary artery disease), native coronary artery 08/19/2015  . Shoulder pain 06/11/2015  . Hyperlipidemia 06/10/2015  . Tobacco use disorder 05/28/2015  . Essential hypertension 05/27/2015  . Bipolar disorder ([place] 05/27/2015  . Hypertrophic cardiomyopathy ([place] 05/27/2015    DCarney LivingPT DPT  02/23/2018, 11:45 AM  [facility] [address]Phone: [phone]  Fax:  [phone] Name: Kyle GehringMRN: [id number] of Birth: 6Apr 10, 1967

## 2018-02-26 DIAGNOSIS — H5213 Myopia, bilateral: Secondary | ICD-10-CM | POA: Diagnosis not present

## 2018-02-26 LAB — HM DIABETES EYE EXAM

## 2018-02-28 LAB — HM DIABETES EYE EXAM

## 2018-03-01 ENCOUNTER — Ambulatory Visit: Payer: Medicare HMO | Admitting: Physical Therapy

## 2018-03-07 ENCOUNTER — Other Ambulatory Visit: Payer: Self-pay | Admitting: *Deleted

## 2018-03-07 NOTE — Patient Outreach (Signed)
[facility]) Care Management  03/07/2018  Donovin Lamacchia 12/28/1965 [id number]   Diabetic Eye Examination Assistance  Referral Date:  02/20/2018 Referral Source:  EMMI [employer] Diabetic Reason for Referral:  [facility] Diabetic Eye Examination Insurance:  [facility] Medicare & Medicaid   Outreach Attempt:  Successful telephone outreach to patient.  HIPAA verified with patient.  Patient confirms he attended eye examination at [facility].  States he went sometime last week, unsure of date or day.  Verbalizes he was told exam was normal except for some areas of bilateral pupils and he was referred to a Opthalmologist.  Patient is unsure of the Opthalmologist he was referred to or the practice name, but does report an appointment scheduled for 03/22/2018.  States he has the paperwork, not feeling well enough to find it at this moment.  RN Health Coach made outreach to Dr. Talmage Coin office at [facility] [phone]) and spoke with Graylin Shiver whom verifies patient did have eye exam completed last week.  Per Graylin Shiver exam was negative for retinopathy.  RN Health Coach requested exam be faxed to primary care providers office, Dr. Deirdre Priest [phone]).  Graylin Shiver stated report would be faxed.  RN Health Coach reviewed and discussed [facility] services with patient and again patient declines services at this time.  Encouraged patient to contact [facility] in the future if he desires services.  Plan: RN Health Coach requested Dr. Talmage Coin office at [facility] to fax eye exam results to patient's primary care provider. RN Health Coach will close case based on patient declining [facility] services at this time and patient has attended his initial eye exam appointment.  Rhae Lerner RN [facility] Care Management  RN Health Coach [phone] [email or web address]

## 2018-03-08 ENCOUNTER — Ambulatory Visit: Payer: Medicare HMO | Admitting: Physical Therapy

## 2018-03-16 ENCOUNTER — Ambulatory Visit: Payer: Medicare HMO | Attending: Orthopaedic Surgery | Admitting: Physical Therapy

## 2018-03-16 ENCOUNTER — Encounter: Payer: Self-pay | Admitting: Physical Therapy

## 2018-03-16 DIAGNOSIS — M545 Low back pain, unspecified: Secondary | ICD-10-CM

## 2018-03-16 DIAGNOSIS — R2689 Other abnormalities of gait and mobility: Secondary | ICD-10-CM | POA: Diagnosis not present

## 2018-03-16 DIAGNOSIS — G8929 Other chronic pain: Secondary | ICD-10-CM | POA: Insufficient documentation

## 2018-03-16 DIAGNOSIS — M62830 Muscle spasm of back: Secondary | ICD-10-CM | POA: Insufficient documentation

## 2018-03-16 DIAGNOSIS — R293 Abnormal posture: Secondary | ICD-10-CM | POA: Diagnosis not present

## 2018-03-16 NOTE — Therapy (Signed)
[place], [place], [address] Phone: [phone]   Fax:  [phone]  Physical Therapy Treatment/Discharge  Patient Details  Name: Kyle Peterson MRN: [id number] Date of Birth: 1965/12/27 Referring Provider (PT): Rodell Perna MD   Encounter Date: 03/16/2018  PT End of Session - 03/16/18 1006    Visit Number  8    Number of Visits  12    Date for PT Re-Evaluation  03/26/18    Authorization Type  MCR with MCD secondary    PT Start Time  0845    PT Stop Time  0916   shortened session 2nd to discharge and feeling comofrtable with activity    PT Time Calculation (min)  31 min    Activity Tolerance  Patient tolerated treatment well    Behavior During Therapy  [facility] for tasks assessed/performed       Past Medical History:  Diagnosis Date  . AICD (automatic cardioverter/defibrillator) present    bostan scien  . Anginal pain ([place])    ?heart pain   per dr Ileana Ladd  . Anxiety   . Arthritis    ddd  . CAD (coronary artery disease), native coronary artery 08/19/2015  . CHF (congestive heart failure) ([place])   . Depression   . Diabetes mellitus without complication ([place])    no meds  new dx  . Heart murmur   . High cholesterol   . Hypertension   . Myocardial infarction ([place])   . OSA (obstructive sleep apnea)    Severe with AHI 80/hr- new bipap  . PAF (paroxysmal atrial fibrillation) ([place]) 08/19/2015  . PTSD (post-traumatic stress disorder)   . Stroke ([place]) 2016  . Substance abuse [facility])     Past Surgical History:  Procedure Laterality Date  . CARDIAC CATHETERIZATION     [facility]: LM NL, mild narrowing of proximal/mid LAD, mid LCX, proximal RCA. 30% ostial OM1. 40% distal RCA. Medical tx.   . CARPAL TUNNEL RELEASE Left 11/02/2015  . CERVICAL DISCECTOMY    . MYOMECTOMY    . SHOULDER ARTHROSCOPY WITH ROTATOR CUFF REPAIR Left 09/09/2016   Procedure: LEFT SHOULDER ARTHROSCOPY WITH MINI OPEN ROTATOR CUFF  REPAIR;  Surgeon: Marybelle Killings, MD;  Location: [place];  Service: Orthopedics;  Laterality: Left;  . TONSILLECTOMY      There were no vitals filed for this visit.  Subjective Assessment - 03/16/18 0851    Subjective  Patient feels like for the most part he is improving. he continues to have pain. He is   (Pended)     How long can you sit comfortably?  45 min  (Pended)     How long can you stand comfortably?  45 min   (Pended)     How long can you walk comfortably?  45 min  (Pended)     Diagnostic tests  x-ray 12/13/2017 Normal lumbar x-rays with some facet arthritis worse at L4-5 and L5-S1.  (Pended)     Patient Stated Goals  to decrease pain, to get back to walking / exercise  (Pended)         Pain level 4/5  Lower back No radiating pain  Pain with activity  Better with stretching                 [facility] Adult PT Treatment/Exercise - 03/16/18 0001      Self-Care   Other Self-Care Comments   reviewed the use of thera-cane and where to buy;  reviewed core strengthening with home exercises. Verbally reviewed home exercise program. Reviewed progression of exercises; reviewed proper squat and lifting technique.              PT Education - 03/16/18 1006    Education Details  reviewed final HEP and activity progression    Person(s) Educated  Patient    Methods  Explanation;Demonstration;Verbal cues;Tactile cues    Comprehension  Verbalized understanding;Returned demonstration;Verbal cues required;Tactile cues required       PT Short Term Goals - 03/16/18 1010      PT SHORT TERM GOAL #1   Title  pt to be I with initial HEP    Baseline  perfroming HEP     Time  3    Period  Weeks    Status  Achieved    Target Date  01/09/18      PT SHORT TERM GOAL #2   Title  pt to verbalize and demo proper posture and lifting mechanics to reduce and prevent low back pain     Baseline  has the knowledge of good posture for lifting. Has not been doing a lot of lifting lately.      Time  3    Period  Weeks    Status  Achieved      PT SHORT TERM GOAL #3   Title  pt to demonstrate reduce low back muscle spasm to promote trunk mobility and reduce pain to </= 4/10     Baseline  4/10 pain today with trunk mobility but can reach a 10/10     Time  3    Period  Weeks    Status  Achieved    Target Date  01/09/18        PT Long Term Goals - 03/16/18 1011      PT LONG TERM GOAL #1   Title  increase trunk flexion to >/= 70 degrees and bil sidebending to>/= 20 degrees with </= 2/10 pain for funtional mobility required for ADLs    Baseline  per visual inspection imporved motion     Time  6    Period  Weeks    Status  Achieved      PT LONG TERM GOAL #2   Title  pt to be abel to sit/ stand and walk for >/= 60 min for personal goal of returning to exercise reporting </= 1/10 pain    Baseline  is approaching the point were he can walk 60 minutes depending on the day.     Time  6    Period  Weeks    Status  Achieved      PT LONG TERM GOAL #3   Title  increase ODI by </= 10% to demo improvement in function     Baseline  26% disability    Time  6    Period  Weeks    Status  Unable to assess      PT LONG TERM GOAL #4   Title  pt to be I with all HEP given as of last visit to maintain and progress current level of function     Baseline  independent with current HEp and progressing as tolerated    Time  6    Period  Weeks    Status  Achieved            Plan - 03/16/18 1007    Clinical Impression Statement  Patient has mad progress. His pain level is down and  he is working on losing weight. He has a routine with his exercises. He feels comfortable with his HEP. Therapy reviewed use of the thera-cane and also reviewed progressionof working out. See below for goal specific progress. D/C to HEP.     Clinical Presentation  Evolving    Clinical Decision Making  Moderate    Rehab Potential  Good    PT Frequency  1x / week    PT Duration  6 weeks    PT  Treatment/Interventions  ADLs/Self Care Home Management;Cryotherapy;Electrical Stimulation;Iontophoresis 55m/ml Dexamethasone;Moist Heat;Traction;Ultrasound;Neuromuscular re-education;Therapeutic activities;Therapeutic exercise;Manual techniques;Taping;Dry needling;Passive range of motion;Patient/family education;Spinal Manipulations;Gait training;Stair training    PT Next Visit Plan  d?c to hep     PT Home Exercise Plan  lower trunk rotation, hamstring stretching, SLR, posterior pelvic tilt    Consulted and Agree with Plan of Care  Patient       Patient will benefit from skilled therapeutic intervention in order to improve the following deficits and impairments:  Obesity, Pain, Increased fascial restricitons, Increased muscle spasms, Abnormal gait, Decreased endurance, Decreased activity tolerance, Decreased balance, Decreased range of motion, Postural dysfunction, Improper body mechanics  Visit Diagnosis: Chronic bilateral low back pain, unspecified whether sciatica present  Other abnormalities of gait and mobility  Abnormal posture  Muscle spasm of back     Problem List Patient Active Problem List   Diagnosis Date Noted  . Chronic bilateral low back pain 12/13/2017  . Posterior tibial tendonitis, right 12/13/2017  . Class 3 severe obesity due to excess calories with serious comorbidity and body mass index (BMI) of 40.0 to 44.9 in adult ([place] 12/13/2017  . Chest mass 05/30/2017  . S/P left rotator cuff repair 10/25/2016  . Severe obesity (BMI >= 40) (HC[place] 09/27/2016  . Dilated aortic root ([place] 07/20/2016  . Claudication ([place] 07/20/2016  . Heart murmur 07/20/2016  . Trigger finger, left middle finger 06/21/2016  . Diabetes ([place] 05/24/2016  . Depression, major, recurrent, moderate ([place] 11/05/2015  . Chronic post-traumatic stress disorder (PTSD) 11/05/2015    Class: Chronic  . OSA (obstructive sleep apnea)   . Persistent atrial fibrillation 08/19/2015  . CAD (coronary  artery disease), native coronary artery 08/19/2015  . Shoulder pain 06/11/2015  . Hyperlipidemia 06/10/2015  . Tobacco use disorder 05/28/2015  . Essential hypertension 05/27/2015  . Bipolar disorder (H[place] 05/27/2015  . Hypertrophic cardiomyopathy (HB[place] 05/27/2015   PHYSICAL THERAPY DISCHARGE SUMMARY  Visits from Start of Care: 8  Current functional level related to goals / functional outcomes: Improved back pain    Remaining deficits: Still has back pain with activity    Education / Equipment: HEP   Plan: Patient agrees to discharge.  Patient goals were not met. Patient is being discharged due to meeting the stated rehab goals.  ?????       DCarney LivingPT DPT  03/16/2018, 10:14 AM  [facility] [address]Phone: [phone]  Fax:  [phone] Name: JDaxtyn RottenbergMRN: [id number] of Birth: [birth year]

## 2018-03-19 ENCOUNTER — Ambulatory Visit (INDEPENDENT_AMBULATORY_CARE_PROVIDER_SITE_OTHER): Payer: Medicare HMO

## 2018-03-19 DIAGNOSIS — I422 Other hypertrophic cardiomyopathy: Secondary | ICD-10-CM | POA: Diagnosis not present

## 2018-03-19 LAB — CUP PACEART REMOTE DEVICE CHECK
Battery Remaining Percentage: 100 %
Brady Statistic RV Percent Paced: 0 %
Date Time Interrogation Session: 20200217073100
HighPow Impedance: 69 Ohm
Implantable Lead Implant Date: 20160218
Implantable Lead Location: 753860
Implantable Lead Model: 292
Implantable Lead Serial Number: 358834
Implantable Pulse Generator Implant Date: 20160218
Lead Channel Impedance Value: 844 Ohm
Lead Channel Pacing Threshold Amplitude: 0.7 V
Lead Channel Pacing Threshold Pulse Width: 0.5 ms
Lead Channel Setting Pacing Amplitude: 2 V
Lead Channel Setting Pacing Pulse Width: 0.5 ms
Lead Channel Setting Sensing Sensitivity: 0.3 mV
MDC IDC MSMT BATTERY REMAINING LONGEVITY: 144 mo
Pulse Gen Serial Number: 202913

## 2018-03-21 ENCOUNTER — Other Ambulatory Visit: Payer: Self-pay | Admitting: Family Medicine

## 2018-03-22 DIAGNOSIS — H11443 Conjunctival cysts, bilateral: Secondary | ICD-10-CM | POA: Diagnosis not present

## 2018-03-27 NOTE — Progress Notes (Signed)
Remote ICD transmission.   

## 2018-04-09 DIAGNOSIS — G4733 Obstructive sleep apnea (adult) (pediatric): Secondary | ICD-10-CM | POA: Diagnosis not present

## 2018-04-12 ENCOUNTER — Encounter: Payer: Self-pay | Admitting: Family Medicine

## 2018-04-18 ENCOUNTER — Ambulatory Visit: Payer: Medicaid Other

## 2018-05-22 ENCOUNTER — Other Ambulatory Visit: Payer: Self-pay | Admitting: Family Medicine

## 2018-05-22 ENCOUNTER — Other Ambulatory Visit: Payer: Self-pay | Admitting: Cardiology

## 2018-06-18 ENCOUNTER — Ambulatory Visit (INDEPENDENT_AMBULATORY_CARE_PROVIDER_SITE_OTHER): Payer: Medicare HMO | Admitting: *Deleted

## 2018-06-18 ENCOUNTER — Other Ambulatory Visit: Payer: Self-pay

## 2018-06-18 DIAGNOSIS — I422 Other hypertrophic cardiomyopathy: Secondary | ICD-10-CM

## 2018-06-19 LAB — CUP PACEART REMOTE DEVICE CHECK
Battery Remaining Longevity: 144 mo
Battery Remaining Percentage: 100 %
Brady Statistic RV Percent Paced: 0 %
Date Time Interrogation Session: 20200518063100
HighPow Impedance: 70 Ohm
Implantable Lead Implant Date: 20160218
Implantable Lead Location: 753860
Implantable Lead Model: 292
Implantable Lead Serial Number: 358834
Implantable Pulse Generator Implant Date: 20160218
Lead Channel Impedance Value: 821 Ohm
Lead Channel Pacing Threshold Amplitude: 0.7 V
Lead Channel Pacing Threshold Pulse Width: 0.5 ms
Lead Channel Setting Pacing Amplitude: 2 V
Lead Channel Setting Pacing Pulse Width: 0.5 ms
Lead Channel Setting Sensing Sensitivity: 0.3 mV
Pulse Gen Serial Number: 202913

## 2018-06-26 ENCOUNTER — Encounter: Payer: Self-pay | Admitting: Cardiology

## 2018-06-26 NOTE — Progress Notes (Signed)
Remote ICD transmission.   

## 2018-07-09 DIAGNOSIS — G4733 Obstructive sleep apnea (adult) (pediatric): Secondary | ICD-10-CM | POA: Diagnosis not present

## 2018-07-25 DIAGNOSIS — Z20828 Contact with and (suspected) exposure to other viral communicable diseases: Secondary | ICD-10-CM | POA: Diagnosis not present

## 2018-08-07 ENCOUNTER — Telehealth: Payer: Self-pay | Admitting: Cardiology

## 2018-08-07 NOTE — Progress Notes (Signed)
Virtual Visit via Video Note   This visit type was conducted due to national recommendations for restrictions regarding the COVID-19 Pandemic (e.g. social distancing) in an effort to limit this patient's exposure and mitigate transmission in our community.  Due to his co-morbid illnesses, this patient is at least at moderate risk for complications without adequate follow up.  This format is felt to be most appropriate for this patient at this time.  All issues noted in this document were discussed and addressed.  A limited physical exam was performed with this format.  Please refer to the patient's chart for his consent to telehealth for [facility].   Evaluation Performed:  Follow-up visit  This visit type was conducted due to national recommendations for restrictions regarding the COVID-19 Pandemic (e.g. social distancing).  This format is felt to be most appropriate for this patient at this time.  All issues noted in this document were discussed and addressed.  No physical exam was performed (except for noted visual exam findings with Video Visits).  Please refer to the patient's chart (MyChart message for video visits and phone note for telephone visits) for the patient's consent to telehealth for [facility].  Date:  08/08/2018   ID:  Kyle Peterson, DOB 1965/04/10, MRN [id number]  Patient Location:  Home  Provider location:   [place]  PCP:  Carney Livinghambliss, Marshall L, MD  Cardiologist:  Will Jorja LoaMartin Camnitz, MD  Sleep medicine:  Armanda Magicraci Emile Ringgenberg, MD  Chief Complaint:  OSA, CAD, HTN, afib  History of Present Illness:    Kyle Peterson is a 53 y.o. male who presents via audio/video conferencing for a telehealth visit today.    Kyle Peterson is a 53 y.o. male with a hx of severe OSA with an AHI of 80.8/hr and underwent CPAP titration but due to ongoing events could not be titrated on CPAP and ultimately was placed on BiPAP at 20/16cm H2O. He also has a history ofnonobstructiveCAD,PAF,HTN, HOCM s/p  septal myectomy s/p AICD, hyperlipidemia, polysubstance abuse with heroin/cocaine and ETOH for 25 years but has been clean for over 10 years.  He is here today for followup and is doing well.  He denies any chest pain or pressure,  PND, orthopnea, LE edema, dizziness, palpitations or syncope. He has chronic DOE that is stable and related to smoking.  He would like to quit smoking but does not want to use medication to do it.  He is compliant with his meds and is tolerating meds with no SE.  He is doing well with his CPAP device and thinks that He has gotten used to it.  He tolerates the mask and feels the pressure is adequate.  Since going on CPAP He feels rested in the am and has no significant daytime sleepiness.  He has problems with mouth dryness with his full face mask.  He has not been using the humidifier.  He does not think that he snores.    The patient does not have symptoms concerning for COVID-19 infection (fever, chills, cough, or new shortness of breath).   Prior CV studies:   The following studies were reviewed today:  PAP compliance download  Past Medical History:  Diagnosis Date   AICD (automatic cardioverter/defibrillator) present    bostan scien   Anginal pain (HCC)    ?heart pain   per dr Estill Doomscamntiz   Anxiety    Arthritis    ddd   CAD (coronary artery disease), native coronary artery 08/19/2015   CHF (congestive heart  failure) ([place])    Depression    Diabetes mellitus without complication ([place])    no meds  new dx   Heart murmur    High cholesterol    Hypertension    Myocardial infarction (HCC)    OSA (obstructive sleep apnea)    Severe with AHI 80/hr- new bipap   PAF (paroxysmal atrial fibrillation) ([place]) 08/19/2015   PTSD (post-traumatic stress disorder)    Stroke ([place]) 2016   Substance abuse [facility])    Past Surgical History:  Procedure Laterality Date   CARDIAC CATHETERIZATION     01/13/[facility]: LM NL, mild narrowing of proximal/mid  LAD, mid LCX, proximal RCA. 30% ostial OM1. 40% distal RCA. Medical tx.    CARPAL TUNNEL RELEASE Left 11/02/2015   CERVICAL DISCECTOMY     MYOMECTOMY     SHOULDER ARTHROSCOPY WITH ROTATOR CUFF REPAIR Left 09/09/2016   Procedure: LEFT SHOULDER ARTHROSCOPY WITH MINI OPEN ROTATOR CUFF REPAIR;  Surgeon: Marybelle Killings, MD;  Location: [place];  Service: Orthopedics;  Laterality: Left;   TONSILLECTOMY       Current Meds  Medication Sig   atorvastatin (LIPITOR) 40 MG tablet TAKE 1 TABLET (40 MG TOTAL) BY MOUTH DAILY.   diltiazem (CARDIZEM CD) 240 MG 24 hr capsule TAKE 1 CAPSULE EVERY DAY   furosemide (LASIX) 20 MG tablet TAKE 1 TABLET EVERY DAY   losartan (COZAAR) 100 MG tablet Take 1 tablet (100 mg total) by mouth daily.   metoprolol succinate (TOPROL-XL) 50 MG 24 hr tablet TAKE 1 TABLET  DAILY WITH OR IMMEDIATELY FOLLOWING A MEAL.   orphenadrine (NORFLEX) 100 MG tablet Take 100 mg by mouth as needed for muscle spasms.   XARELTO 20 MG TABS tablet TAKE 1 TABLET EVERY DAY WITH SUPPER     Allergies:   Ace inhibitors   Social History   Tobacco Use   Smoking status: Current Every Day Smoker    Packs/day: 0.50    Years: 40.00    Pack years: 20.00    Types: Cigarettes   Smokeless tobacco: Never Used   Tobacco comment: Trying to cut back   Substance Use Topics   Alcohol use: No    Alcohol/week: 0.0 standard drinks   Drug use: Yes    Frequency: 3.0 times per week    Types: Marijuana    Comment: last 07/26/16     Family Hx: The patient's family history includes Alcohol abuse in his brother and father; Diabetes in his father and mother; Drug abuse in his brother; Heart disease in his father; Hypertension in his father and mother.  ROS:   Please see the history of present illness.     All other systems reviewed and are negative.   Labs/Other Tests and Data Reviewed:    Recent Labs: 10/16/2017: ALT 18 02/21/2018: BUN 10; Creatinine, Ser 1.16; Potassium 4.5; Sodium 141    Recent Lipid Panel Lab Results  Component Value Date/Time   CHOL 116 10/16/2017 07:32 AM   TRIG 116 10/16/2017 07:32 AM   HDL 35 (L) 10/16/2017 07:32 AM   CHOLHDL 3.3 10/16/2017 07:32 AM   CHOLHDL 5.6 (H) 06/10/2015 09:24 AM   LDLCALC 58 10/16/2017 07:32 AM    Wt Readings from Last 3 Encounters:  08/08/18 276 lb 9.6 oz (125.5 kg)  02/06/18 292 lb (132.5 kg)  12/13/17 285 lb (129.3 kg)     Objective:    Vital Signs:  Ht 5\' 8"  (1.727 m)    Wt 276 lb  9.6 oz (125.5 kg)    BMI 42.06 kg/m    CONSTITUTIONAL:  Well nourished, well developed male in no acute distress.  EYES: anicteric MOUTH: oral mucosa is pink RESPIRATORY: Normal respiratory effort, symmetric expansion CARDIOVASCULAR: No peripheral edema SKIN: No rash, lesions or ulcers MUSCULOSKELETAL: no digital cyanosis NEURO: Cranial Nerves II-XII grossly intact, moves all extremities PSYCH: Intact judgement and insight.  A&O x 3, Mood/affect appropriate   ASSESSMENT & PLAN:    1.  ASCAD - [facility] LM NL, mild narrowing of proximal/mid LAD, mid LCX, proximal RCA. 30% ostial OM1. 40% distal RCA   -no anginal sx since his last OV with me -continue BB and statin -no ASA due to DOAC -he continues to smoke and wants to quit but does not want to try nicotine gum or meds.    2.  Persistent atrial Fibrillation  -he thinks he is in NSR with no breakthrough of palpitations  -no bleeding problems on DOAC -his creatinine was stable at 1.16 on 02/21/2018. -he will continue on Cardizem CD 240mg  daily, Toprol XL 50mg  daily and Xarelto 20mg  daiy and I have refilled these today.   3.  HOCM - s/p septal myomectomy.  4.  HTN  -He has not been checking his BP at home -continue Torol XL 50mg  daily, Cardizem CD 240mg  daily, and Losartan 100mg  daily.   5.  Dilated aortic root  - Aortic root was 35mm by echo 2020. - I will repeat echo to reassess.  6.  OSA - the patient is tolerating PAP therapy well  without any problems. The PAP download was reviewed today and showed an AHI of 1.8/hr on 20/16 cm H2O with 97% compliance in using more than 4 hours nightly.  The patient has been using and benefiting from PAP use and will continue to benefit from therapy. I have encouraged him to use the humidifier to prevent dry mouth.    7.  Hyperlipidemia - LDL goal is < 70.  His LDL was 58 on 10/16/2017.  He will continue on atorvastatin 40mg  daily.  Check FLP and ALT in 10/2018  8.  Morbid obesity - he is limited with exercise due to back problems  COVID-19 Education: The signs and symptoms of COVID-19 were discussed with the patient and how to seek care for testing (follow up with PCP or arrange E-visit).  The importance of social distancing was discussed today.  Patient Risk:   After full review of this patient's clinical status, I feel that they are at least moderate risk at this time.  Time:   Today, I have spent 20 minutes directly with the patient on Video discussing medical problems including OSA, AFib, HTN, lipids, CAD.  We also reviewed the symptoms of COVID 19 and the ways to protect against contracting the virus with telehealth technology.  I spent an additional 5 minutes reviewing patient's chart including 2D echo, PAP compliance download and labs.  Medication Adjustments/Labs and Tests Ordered: Current medicines are reviewed at length with the patient today.  Concerns regarding medicines are outlined above.  Tests Ordered: No orders of the defined types were placed in this encounter.  Medication Changes: No orders of the defined types were placed in this encounter.   Disposition:  Follow up in 6 month(s)  Signed, Armanda Magicraci Brok Stocking, MD  08/08/2018 9:16 AM    [facility]

## 2018-08-07 NOTE — Telephone Encounter (Signed)
New message   Pt scheduled for f/u visit. Pt does not have video access, will do phone call. Pt phone number is listed in appt notes.       Virtual Visit Pre-Appointment Phone Call  "(Name), I am calling you today to discuss your upcoming appointment. We are currently trying to limit exposure to the virus that causes COVID-19 by seeing patients at home rather than in the office."  1. "What is the BEST phone number to call the day of the visit?" - include this in appointment notes  2. Do you have or have access to (through a family member/friend) a smartphone with video capability that we can use for your visit?" a. If yes - list this number in appt notes as cell (if different from BEST phone #) and list the appointment type as a VIDEO visit in appointment notes b. If no - list the appointment type as a PHONE visit in appointment notes  3. Confirm consent - "In the setting of the current Covid19 crisis, you are scheduled for a (phone or video) visit with your provider on (date) at (time).  Just as we do with many in-office visits, in order for you to participate in this visit, we must obtain consent.  If you'd like, I can send this to your mychart (if signed up) or email for you to review.  Otherwise, I can obtain your verbal consent now.  All virtual visits are billed to your insurance company just like a normal visit would be.  By agreeing to a virtual visit, we'd like you to understand that the technology does not allow for your provider to perform an examination, and thus may limit your provider's ability to fully assess your condition. If your provider identifies any concerns that need to be evaluated in person, we will make arrangements to do so.  Finally, though the technology is pretty good, we cannot assure that it will always work on either your or our end, and in the setting of a video visit, we may have to convert it to a phone-only visit.  In either situation, we cannot ensure that  we have a secure connection.  Are you willing to proceed?" STAFF: Did the patient verbally acknowledge consent to telehealth visit? Document YES/NO here: YES  4. Advise patient to be prepared - "Two hours prior to your appointment, go ahead and check your blood pressure, pulse, oxygen saturation, and your weight (if you have the equipment to check those) and write them all down. When your visit starts, your provider will ask you for this information. If you have an Apple Watch or Kardia device, please plan to have heart rate information ready on the day of your appointment. Please have a pen and paper handy nearby the day of the visit as well."  5. Give patient instructions for MyChart download to smartphone OR Doximity/Doxy.me as below if video visit (depending on what platform provider is using)  6. Inform patient they will receive a phone call 15 minutes prior to their appointment time (may be from unknown caller ID) so they should be prepared to answer    TELEPHONE CALL NOTE  Phelix Fudala has been deemed a candidate for a follow-up tele-health visit to limit community exposure during the Covid-19 pandemic. I spoke with the patient via phone to ensure availability of phone/video source, confirm preferred email & phone number, and discuss instructions and expectations.  I reminded Kyle Peterson to be prepared with any vital sign and/or  heart rhythm information that could potentially be obtained via home monitoring, at the time of his visit. I reminded Kyle Peterson to expect a phone call prior to his visit.  Kyle Peterson 08/07/2018 1:37 PM   INSTRUCTIONS FOR DOWNLOADING THE MYCHART APP TO SMARTPHONE  - The patient must first make sure to have activated MyChart and know their login information - If Apple, go to [employer] Store and type in MyChart in the search bar and download the app. If Android, ask patient to go to [employer] Play Store and type in CustarMyChart in the search bar and download the app. The app is  free but as with any other app downloads, their phone may require them to verify saved payment information or Apple/Android password.  - The patient will need to then log into the app with their MyChart username and password, and select [facility] as their healthcare provider to link the account. When it is time for your visit, go to the MyChart app, find appointments, and click Begin Video Visit. Be sure to Select Allow for your device to access the Microphone and Camera for your visit. You will then be connected, and your provider will be with you shortly.  **If they have any issues connecting, or need assistance please contact MyChart service desk [phone])**  **If using a computer, in order to ensure the best quality for their visit they will need to use either of the following Internet Browsers: [employer] Edge, or Google Chrome**  IF USING DOXIMITY or DOXY.ME - The patient will receive a link just prior to their visit by text.     FULL LENGTH CONSENT FOR TELE-HEALTH VISIT   I hereby voluntarily request, consent and authorize [facility] and its employed or contracted physicians, physician assistants, nurse practitioners or other licensed health care professionals (the Practitioner), to provide me with telemedicine health care services (the Services") as deemed necessary by the treating Practitioner. I acknowledge and consent to receive the Services by the Practitioner via telemedicine. I understand that the telemedicine visit will involve communicating with the Practitioner through live audiovisual communication technology and the disclosure of certain medical information by electronic transmission. I acknowledge that I have been given the opportunity to request an in-person assessment or other available alternative prior to the telemedicine visit and am voluntarily participating in the telemedicine visit.  I understand that I have the right to withhold or withdraw my  consent to the use of telemedicine in the course of my care at any time, without affecting my right to future care or treatment, and that the Practitioner or I may terminate the telemedicine visit at any time. I understand that I have the right to inspect all information obtained and/or recorded in the course of the telemedicine visit and may receive copies of available information for a reasonable fee.  I understand that some of the potential risks of receiving the Services via telemedicine include:   Delay or interruption in medical evaluation due to technological equipment failure or disruption;  Information transmitted may not be sufficient (e.g. poor resolution of images) to allow for appropriate medical decision making by the Practitioner; and/or   In rare instances, security protocols could fail, causing a breach of personal health information.  Furthermore, I acknowledge that it is my responsibility to provide information about my medical history, conditions and care that is complete and accurate to the best of my ability. I acknowledge that Practitioner's advice, recommendations, and/or decision may be based on  factors not within their control, such as incomplete or inaccurate data provided by me or distortions of diagnostic images or specimens that may result from electronic transmissions. I understand that the practice of medicine is not an exact science and that Practitioner makes no warranties or guarantees regarding treatment outcomes. I acknowledge that I will receive a copy of this consent concurrently upon execution via email to the email address I last provided but may also request a printed copy by calling the office of [facility].    I understand that my insurance will be billed for this visit.   I have read or had this consent read to me.  I understand the contents of this consent, which adequately explains the benefits and risks of the Services being provided via telemedicine.    I have been provided ample opportunity to ask questions regarding this consent and the Services and have had my questions answered to my satisfaction.  I give my informed consent for the services to be provided through the use of telemedicine in my medical care  By participating in this telemedicine visit I agree to the above.

## 2018-08-08 ENCOUNTER — Telehealth: Payer: Self-pay

## 2018-08-08 ENCOUNTER — Telehealth (INDEPENDENT_AMBULATORY_CARE_PROVIDER_SITE_OTHER): Payer: Medicare HMO | Admitting: Cardiology

## 2018-08-08 ENCOUNTER — Encounter: Payer: Self-pay | Admitting: Cardiology

## 2018-08-08 ENCOUNTER — Other Ambulatory Visit: Payer: Self-pay

## 2018-08-08 VITALS — Ht 68.0 in | Wt 276.6 lb

## 2018-08-08 DIAGNOSIS — E7800 Pure hypercholesterolemia, unspecified: Secondary | ICD-10-CM

## 2018-08-08 DIAGNOSIS — I4819 Other persistent atrial fibrillation: Secondary | ICD-10-CM

## 2018-08-08 DIAGNOSIS — I10 Essential (primary) hypertension: Secondary | ICD-10-CM

## 2018-08-08 DIAGNOSIS — I2510 Atherosclerotic heart disease of native coronary artery without angina pectoris: Secondary | ICD-10-CM | POA: Diagnosis not present

## 2018-08-08 DIAGNOSIS — Z6841 Body Mass Index (BMI) 40.0 and over, adult: Secondary | ICD-10-CM | POA: Diagnosis not present

## 2018-08-08 DIAGNOSIS — G4733 Obstructive sleep apnea (adult) (pediatric): Secondary | ICD-10-CM | POA: Diagnosis not present

## 2018-08-08 DIAGNOSIS — I77810 Thoracic aortic ectasia: Secondary | ICD-10-CM | POA: Diagnosis not present

## 2018-08-08 DIAGNOSIS — I480 Paroxysmal atrial fibrillation: Secondary | ICD-10-CM | POA: Diagnosis not present

## 2018-08-08 IMAGING — CR DG SHOULDER 2+V*L*
3 series · 3 of 3 positions shown · non-contrast
Comparison: 07/07/2015

CLINICAL DATA: Chronic pain, worsening tonight. Unable to move
shoulder.

EXAM:
LEFT SHOULDER - 2+ VIEW

[shoulder grashey]
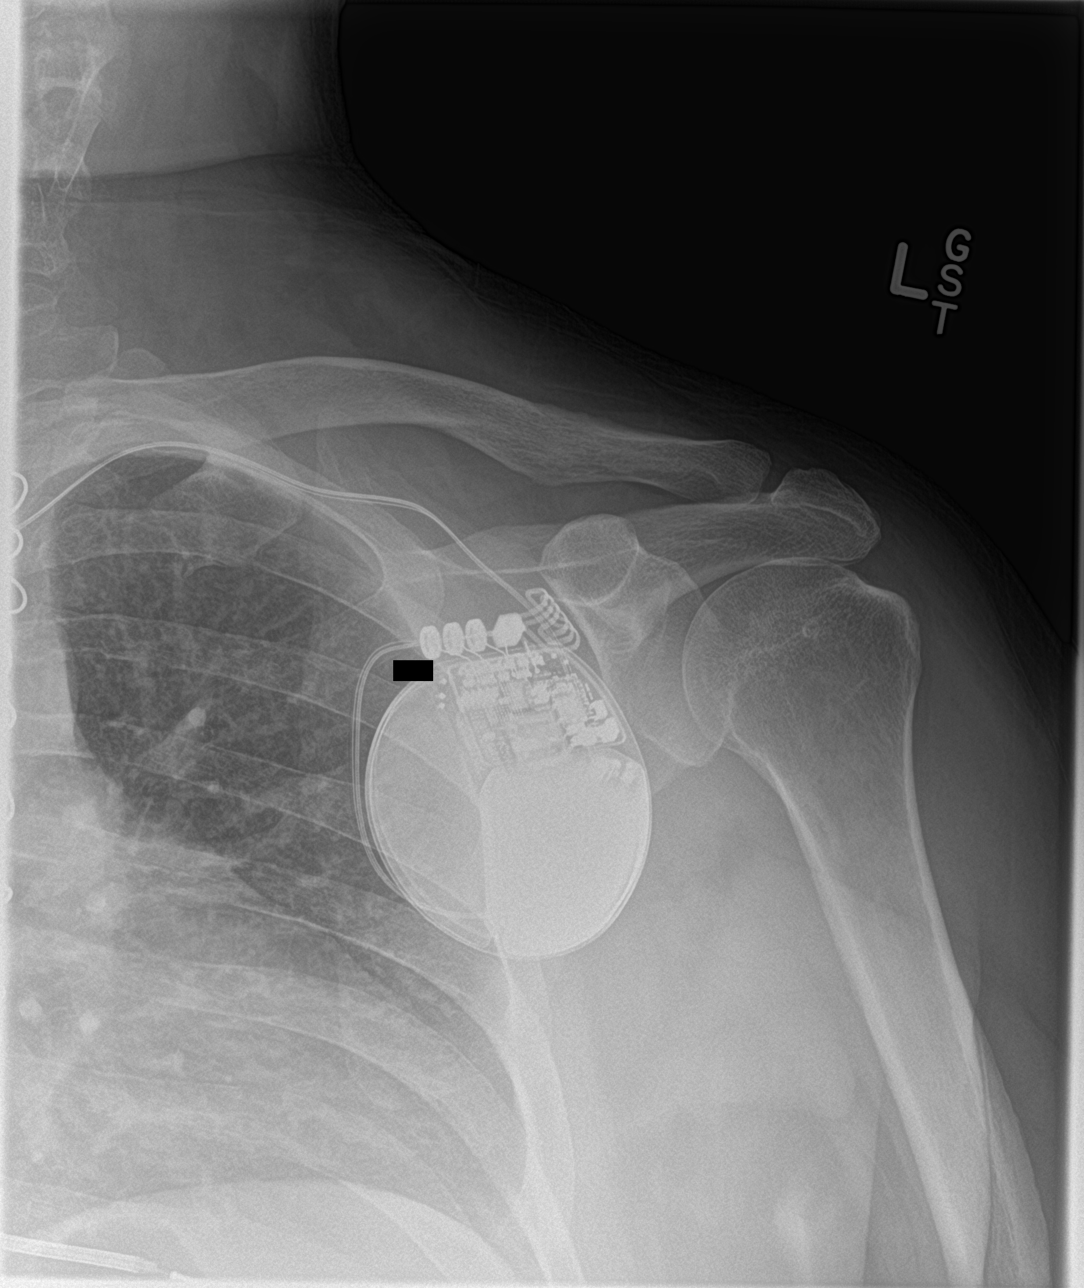

[shoulder y view (1 of 2)]
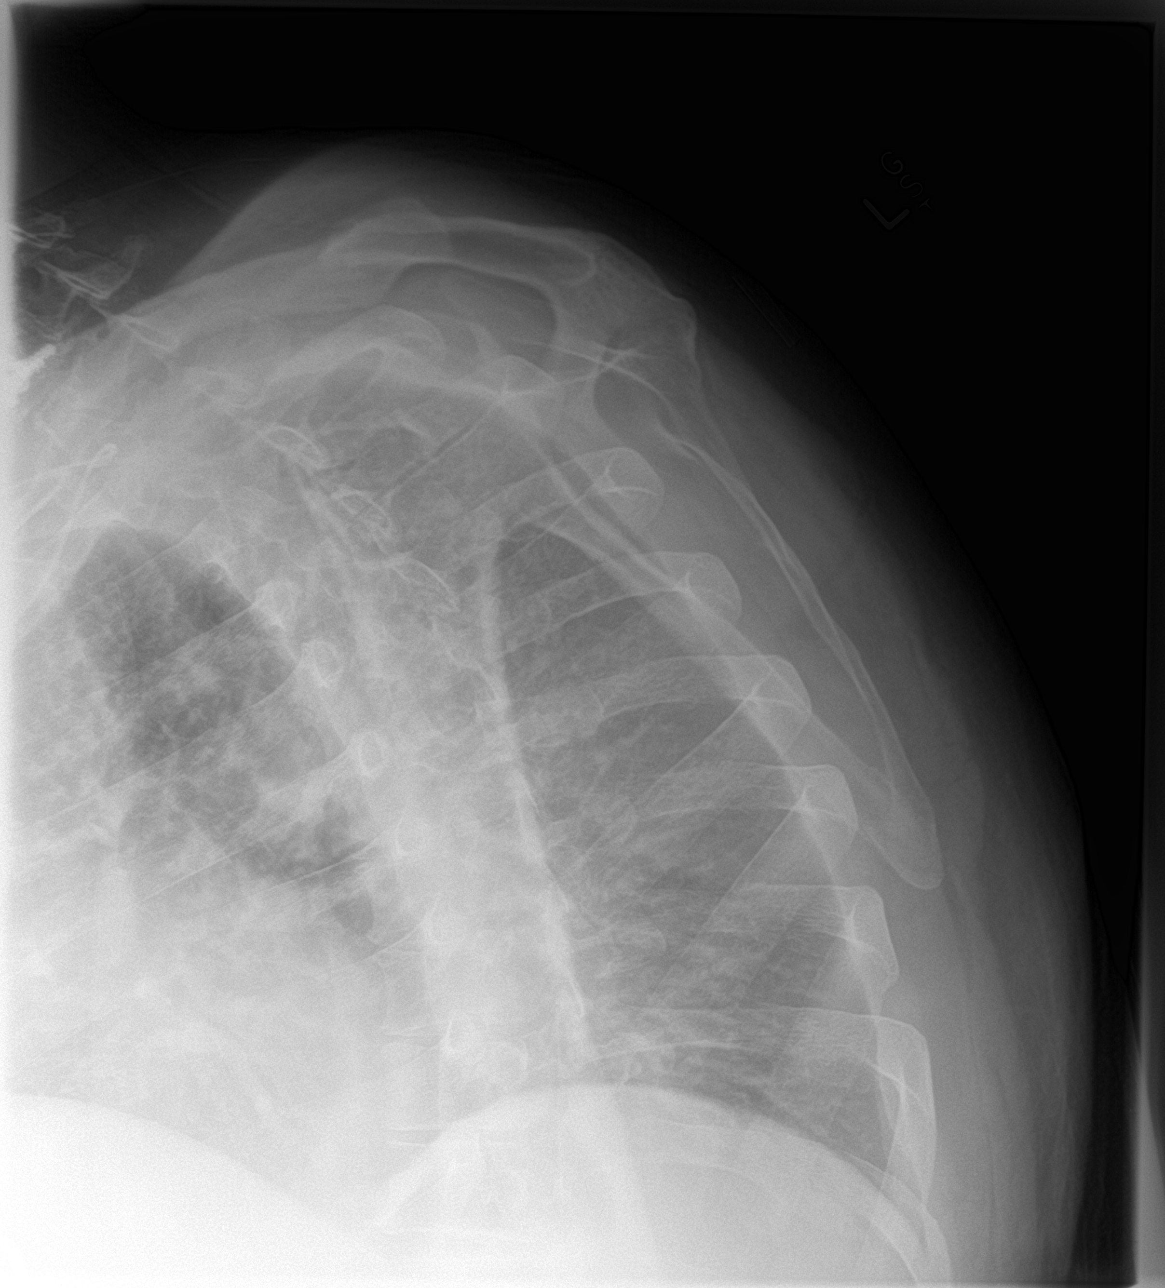

[shoulder y view (2 of 2)]
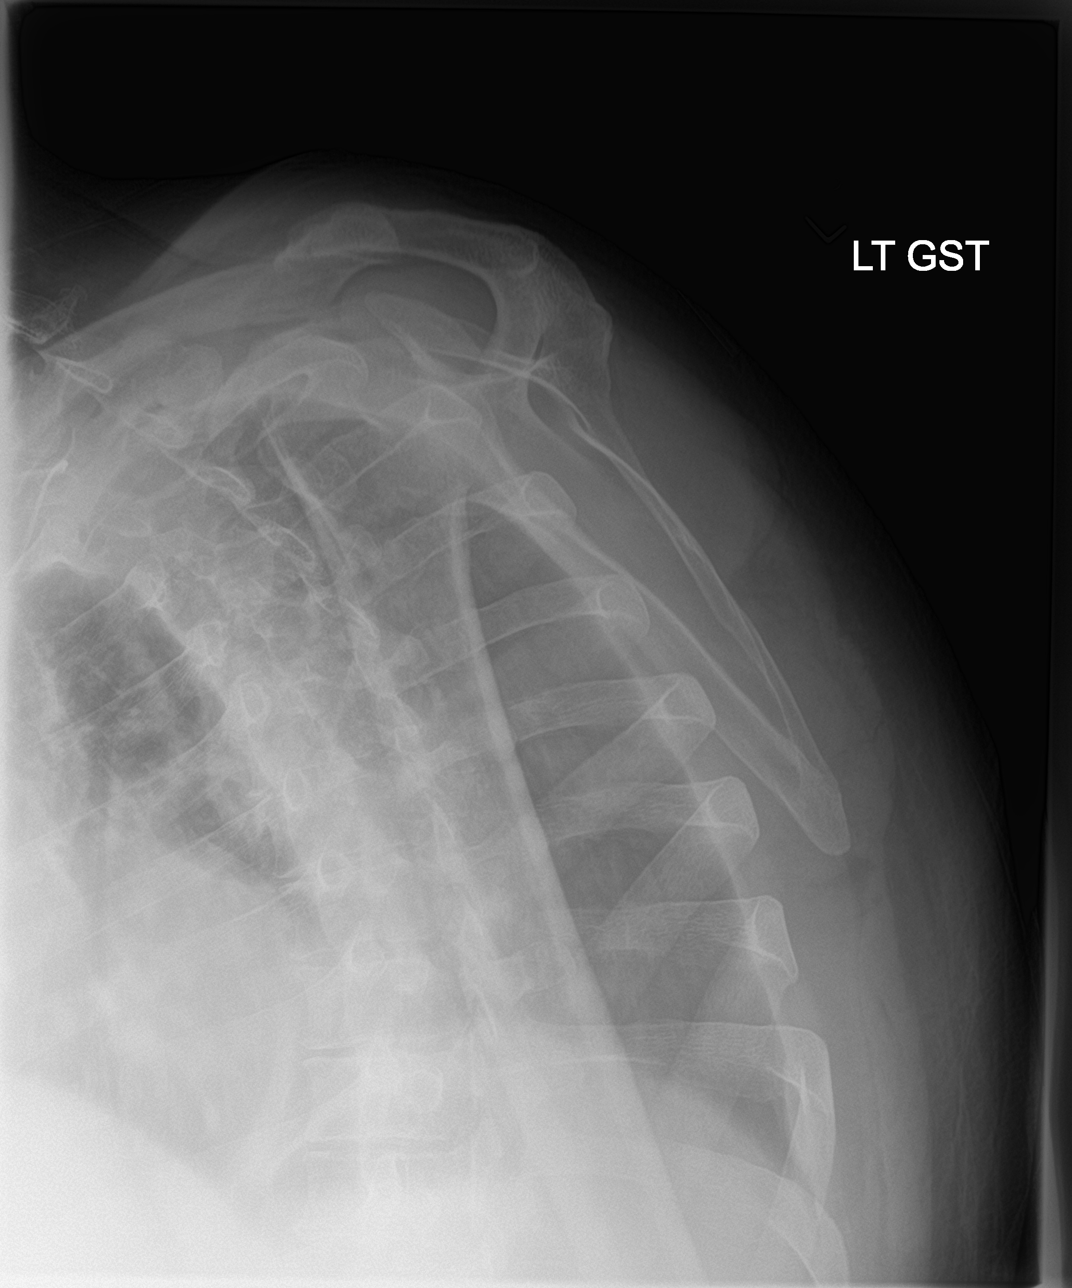

[3 of 3 positions shown; findings below may reference images not displayed]

FINDINGS: Limited positioning due to patient's shoulder immobility. A pacer
also overlaps the scapula on the AP projection.

There is no fracture deformity, dislocation, soft tissue
calcification, or degenerative changes.
IMPRESSION: Negative.

## 2018-08-08 MED ORDER — LOSARTAN POTASSIUM 100 MG PO TABS: 100.0000 mg | ORAL_TABLET | Freq: Every day | ORAL | 3 refills | Status: DC

## 2018-08-08 MED ORDER — RIVAROXABAN 20 MG PO TABS: ORAL_TABLET | ORAL | 3 refills | Status: DC

## 2018-08-08 MED ORDER — ATORVASTATIN CALCIUM 40 MG PO TABS: 40.0000 mg | ORAL_TABLET | Freq: Every day | ORAL | 2 refills | Status: DC

## 2018-08-08 MED ORDER — DILTIAZEM HCL ER COATED BEADS 240 MG PO CP24: 240.0000 mg | ORAL_CAPSULE | Freq: Every day | ORAL | 3 refills | Status: DC

## 2018-08-08 MED ORDER — METOPROLOL SUCCINATE ER 50 MG PO TB24: ORAL_TABLET | ORAL | 3 refills | Status: DC

## 2018-08-08 NOTE — Patient Instructions (Signed)
Medication Instructions:  Your physician recommends that you continue on your current medications as directed. Please refer to the Current Medication list given to you today.  If you need a refill on your cardiac medications before your next appointment, please call your pharmacy.   Lab work: Fasting Labs: Lipid and Liver on 08/21/18  If you have labs (blood work) drawn today and your tests are completely normal, you will receive your results only by: Marland Kitchen MyChart Message (if you have MyChart) OR . A paper copy in the mail If you have any lab test that is abnormal or we need to change your treatment, we will call you to review the results.  Testing/Procedures: Your physician has requested that you have an echocardiogram on 08/21/18 at 7:35 am. Echocardiography is a painless test that uses sound waves to create images of your heart. It provides your doctor with information about the size and shape of your heart and how well your heart's chambers and valves are working. This procedure takes approximately one hour. There are no restrictions for this procedure.  Follow-Up: At [facility], you and your health needs are our priority.  As part of our continuing mission to provide you with exceptional heart care, we have created designated Provider Care Teams.  These Care Teams include your primary Cardiologist (physician) and Advanced Practice Providers (APPs -  Physician Assistants and Nurse Practitioners) who all work together to provide you with the care you need, when you need it. You will need a follow up appointment in 6 months.  Please call our office 2 months in advance to schedule this appointment.  You may see Dr. Radford Pax or one of the following Advanced Practice Providers on your designated Care Team:   [place], PA-C Melina Copa, PA-C . Ermalinda Barrios, PA-C

## 2018-08-08 NOTE — Telephone Encounter (Signed)
During virtual visit with pt today he said he would like for it to be noted that he will not come into the office for a visit until covid-19 has been resolved. He would prefer video visits with his providers.

## 2018-08-21 ENCOUNTER — Other Ambulatory Visit: Payer: Medicaid Other

## 2018-08-21 ENCOUNTER — Encounter: Payer: Self-pay | Admitting: Cardiology

## 2018-08-21 ENCOUNTER — Other Ambulatory Visit: Payer: Self-pay

## 2018-08-21 ENCOUNTER — Ambulatory Visit (HOSPITAL_COMMUNITY): Payer: Medicare HMO | Attending: Cardiology

## 2018-08-21 DIAGNOSIS — I4891 Unspecified atrial fibrillation: Secondary | ICD-10-CM | POA: Diagnosis not present

## 2018-08-21 DIAGNOSIS — I340 Nonrheumatic mitral (valve) insufficiency: Secondary | ICD-10-CM | POA: Diagnosis not present

## 2018-08-21 DIAGNOSIS — E785 Hyperlipidemia, unspecified: Secondary | ICD-10-CM | POA: Insufficient documentation

## 2018-08-21 DIAGNOSIS — I77810 Thoracic aortic ectasia: Secondary | ICD-10-CM

## 2018-08-21 DIAGNOSIS — Z8249 Family history of ischemic heart disease and other diseases of the circulatory system: Secondary | ICD-10-CM | POA: Diagnosis not present

## 2018-08-21 DIAGNOSIS — G4730 Sleep apnea, unspecified: Secondary | ICD-10-CM | POA: Diagnosis not present

## 2018-08-21 DIAGNOSIS — F17200 Nicotine dependence, unspecified, uncomplicated: Secondary | ICD-10-CM | POA: Diagnosis not present

## 2018-08-21 DIAGNOSIS — I252 Old myocardial infarction: Secondary | ICD-10-CM | POA: Insufficient documentation

## 2018-08-21 DIAGNOSIS — I712 Thoracic aortic aneurysm, without rupture: Secondary | ICD-10-CM | POA: Insufficient documentation

## 2018-08-21 DIAGNOSIS — E7800 Pure hypercholesterolemia, unspecified: Secondary | ICD-10-CM | POA: Diagnosis not present

## 2018-08-21 DIAGNOSIS — E669 Obesity, unspecified: Secondary | ICD-10-CM | POA: Insufficient documentation

## 2018-08-21 DIAGNOSIS — I110 Hypertensive heart disease with heart failure: Secondary | ICD-10-CM | POA: Insufficient documentation

## 2018-08-21 DIAGNOSIS — I509 Heart failure, unspecified: Secondary | ICD-10-CM | POA: Insufficient documentation

## 2018-08-21 DIAGNOSIS — E119 Type 2 diabetes mellitus without complications: Secondary | ICD-10-CM | POA: Diagnosis not present

## 2018-08-21 DIAGNOSIS — I7121 Aneurysm of the ascending aorta, without rupture: Secondary | ICD-10-CM | POA: Insufficient documentation

## 2018-08-21 LAB — HEPATIC FUNCTION PANEL
ALT: 19 IU/L (ref 0–44)
AST: 17 IU/L (ref 0–40)
Albumin: 4.6 g/dL (ref 3.8–4.9)
Alkaline Phosphatase: 97 IU/L (ref 39–117)
Bilirubin Total: 0.4 mg/dL (ref 0.0–1.2)
Bilirubin, Direct: 0.12 mg/dL (ref 0.00–0.40)
Total Protein: 7 g/dL (ref 6.0–8.5)

## 2018-08-21 LAB — LIPID PANEL
Chol/HDL Ratio: 3 ratio (ref 0.0–5.0)
Cholesterol, Total: 125 mg/dL (ref 100–199)
HDL: 42 mg/dL (ref >39)
LDL Calculated: 66 mg/dL (ref 0–99)
Triglycerides: 87 mg/dL (ref 0–149)
VLDL Cholesterol Cal: 17 mg/dL (ref 5–40)

## 2018-09-18 ENCOUNTER — Ambulatory Visit (INDEPENDENT_AMBULATORY_CARE_PROVIDER_SITE_OTHER): Payer: Medicare HMO | Admitting: *Deleted

## 2018-09-18 DIAGNOSIS — I422 Other hypertrophic cardiomyopathy: Secondary | ICD-10-CM

## 2018-09-19 ENCOUNTER — Telehealth: Payer: Self-pay

## 2018-09-19 LAB — CUP PACEART REMOTE DEVICE CHECK
Battery Remaining Longevity: 144 mo
Battery Remaining Percentage: 100 %
Brady Statistic RV Percent Paced: 0 %
Date Time Interrogation Session: 20200819101011
HighPow Impedance: 71 Ohm
Implantable Lead Implant Date: 20160218
Implantable Lead Location: 753860
Implantable Lead Model: 292
Implantable Lead Serial Number: 358834
Implantable Pulse Generator Implant Date: 20160218
Lead Channel Impedance Value: 803 Ohm
Lead Channel Pacing Threshold Amplitude: 0.7 V
Lead Channel Pacing Threshold Pulse Width: 0.5 ms
Lead Channel Setting Pacing Amplitude: 2 V
Lead Channel Setting Pacing Pulse Width: 0.5 ms
Lead Channel Setting Sensing Sensitivity: 0.3 mV
Pulse Gen Serial Number: 202913

## 2018-09-19 NOTE — Telephone Encounter (Signed)
Pt was returning someone call stating his transmission was not received. I helped the pt send a manual transmission.

## 2018-09-26 ENCOUNTER — Encounter: Payer: Self-pay | Admitting: Cardiology

## 2018-09-26 NOTE — Progress Notes (Signed)
Remote ICD transmission.   

## 2018-10-09 DIAGNOSIS — Z23 Encounter for immunization: Secondary | ICD-10-CM | POA: Diagnosis not present

## 2018-10-10 DIAGNOSIS — G4733 Obstructive sleep apnea (adult) (pediatric): Secondary | ICD-10-CM | POA: Diagnosis not present

## 2018-10-12 ENCOUNTER — Other Ambulatory Visit: Payer: Self-pay | Admitting: Cardiology

## 2018-10-12 MED ORDER — LOSARTAN POTASSIUM 100 MG PO TABS: 100.0000 mg | ORAL_TABLET | Freq: Every day | ORAL | 3 refills | Status: DC

## 2018-10-12 NOTE — Telephone Encounter (Signed)
Pt's medication was sent to pt's pharmacy as requested. Confirmation received.  °

## 2018-10-23 ENCOUNTER — Telehealth: Payer: Self-pay | Admitting: Cardiology

## 2018-10-23 NOTE — Telephone Encounter (Signed)
New Message:    Pt says he wants to talk to Dr Radford Pax,  I told him the nurse would call him. He said he had an episode this morning where he could not get out of bed. He sit up and fall back in bed. No dizziness or other symptoms, just could not get out of bed.

## 2018-10-23 NOTE — Telephone Encounter (Signed)
I spoke to the patient who called because he struggled to get out of bed this morning.  He sat up and fell right over twice.  He could not figure out what was happening.  He tried a third time and was able to get up.    He is concerned because he has discussed this in the past with Dr Radford Pax.  I recommended that he reach out to his PCP and f/u from there.  He verbalized understanding.

## 2018-10-24 ENCOUNTER — Ambulatory Visit (INDEPENDENT_AMBULATORY_CARE_PROVIDER_SITE_OTHER): Payer: Medicare Other | Admitting: Family Medicine

## 2018-10-24 ENCOUNTER — Other Ambulatory Visit: Payer: Self-pay

## 2018-10-24 VITALS — BP 130/70 | HR 73 | Wt 280.8 lb

## 2018-10-24 DIAGNOSIS — R29818 Other symptoms and signs involving the nervous system: Secondary | ICD-10-CM | POA: Diagnosis not present

## 2018-10-24 DIAGNOSIS — R42 Dizziness and giddiness: Secondary | ICD-10-CM | POA: Diagnosis not present

## 2018-10-24 DIAGNOSIS — F17200 Nicotine dependence, unspecified, uncomplicated: Secondary | ICD-10-CM | POA: Diagnosis not present

## 2018-10-24 DIAGNOSIS — R2681 Unsteadiness on feet: Secondary | ICD-10-CM | POA: Diagnosis not present

## 2018-10-24 NOTE — Patient Instructions (Signed)
Great meeting you today!  Your symptoms are a little concerning to me.  Especially your vision problems and your balance instability.  I think getting an MRI of your brain is a good neck step.  I will also get some lab work to check out a couple things that can cause similar symptoms.  These include syphilis screen, thyroid panel, B12, and a folate level.  I will give a call with these results.  Solik you are interested in stopping smoking.  I do think once we get all this straightened out you come back and see me or Dr. Erin Hearing to help you with this problem.

## 2018-10-24 NOTE — Progress Notes (Signed)
HPI 53 year old male who presents for unsteadiness on his feet.  Patient says symptoms started acutely on 9/22.  He states that when he tried to sit up in bed he was unable to multiple attempts.  States that he did get up he was very unsteady on his feet.  This did eventually resolve but states that he has had a couple of these episodes on top of the initial one.  The patient also endorses 3 or 4 months of "kaleidoscope vision".  He states that this vision abnormalities been present since that time and he occasionally has instances where he just "cannot see".  He does not have any other known neurologic issues.  He states that he has had a significant amount of memory difficulty the last several months as well.  States that he used to be "very sharp" and now is having trouble writing songs.  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  He endorses using several illicit substances including IV heroin. Of note the patient had an ct head back in 2018 which did not show any abnormality.   CC: Unsteadiness on feet   ROS:   Review of Systems See HPI for ROS.   CC, SH/smoking status, and VS noted  Objective: BP 130/70   Pulse 73   Wt 280 lb 12.8 oz (127.4 kg)   SpO2 98%   BMI 42.70 kg/m  Gen: 53 year old Caucasian male, no acute distress, very pleasant HEENT: EOMI, PERRLA.  No mucosal ulcerations. CV: Regular rate and rhythm, no M/R/G Resp: Lungs clear to auscultation bilaterally, no accessory muscle use Neuro: alert and oriented x3. Has visual deficits to lateral and medial extremes of vision. + romberg. No gait abnormality noted.   Assessment and plan:  Unsteadiness on feet Patient with unsteadiness on his feet for one day. More concerning is this apparent visual deficit, apparent memory and cognitive limitations by his report, and +romberg sign suggesting dorsal column pathology. Will get RPR, TSH, folate, and B12 to better evaluate. The  patient will need a brain MRI to better evaluate. He has previous hardware in place from a cervical spinal surgery as well as a defibrillator. Faxed patient's information cards to radiology department to see if he is eligible for this. If unable to get MRI, will get CT angio head and neck. - mri brain if possible - ct angio head and neck if not - tsh, b12, folate, rpr draw  Tobacco use disorder Patient endorses that he is ready to quit smoking. He states that he was able to stop IV heroin but has had a much harder time with cigarettes. Encouraged patient to follow up either with myself or his PCP for smoking cessation.   Orders Placed This Encounter  Procedures  . MR Brain Wo Contrast    Standing Status:   Future    Standing Expiration Date:   12/24/2019    Order Specific Question:   What is the patient's sedation requirement?    Answer:   No Sedation    Order Specific Question:   Does the patient have a pacemaker or implanted devices?    Answer:   No    Order Specific Question:   Preferred imaging location?    Answer:   [facility] (table limit-550lbs)    Order Specific Question:   Radiology Contrast Protocol - do NOT remove file path    Answer:   [email or web address]  . TSH  .  RPR  . Vitamin B12  . Folate    No orders of the defined types were placed in this encounter.   Myrene Buddy MD PGY-3 Family Medicine Resident  10/25/2018 8:23 AM

## 2018-10-25 ENCOUNTER — Encounter: Payer: Self-pay | Admitting: Family Medicine

## 2018-10-25 DIAGNOSIS — R2681 Unsteadiness on feet: Secondary | ICD-10-CM | POA: Insufficient documentation

## 2018-10-25 LAB — FOLATE: Folate: 12.5 ng/mL (ref >3.0)

## 2018-10-25 LAB — RPR: RPR Ser Ql: NONREACTIVE

## 2018-10-25 LAB — TSH: TSH: 1.4 u[IU]/mL (ref 0.450–4.500)

## 2018-10-25 LAB — VITAMIN B12: Vitamin B-12: 360 pg/mL (ref 232–1245)

## 2018-10-25 NOTE — Assessment & Plan Note (Signed)
Patient with unsteadiness on his feet for one day. More concerning is this apparent visual deficit, apparent memory and cognitive limitations by his report, and +romberg sign suggesting dorsal column pathology. Will get RPR, TSH, folate, and B12 to better evaluate. The patient will need a brain MRI to better evaluate. He has previous hardware in place from a cervical spinal surgery as well as a defibrillator. Faxed patient's information cards to radiology department to see if he is eligible for this. If unable to get MRI, will get CT angio head and neck. - mri brain if possible - ct angio head and neck if not - tsh, b12, folate, rpr draw

## 2018-10-25 NOTE — Assessment & Plan Note (Signed)
Patient endorses that he is ready to quit smoking. He states that he was able to stop IV heroin but has had a much harder time with cigarettes. Encouraged patient to follow up either with myself or his PCP for smoking cessation.

## 2018-10-29 ENCOUNTER — Telehealth: Payer: Self-pay | Admitting: Family Medicine

## 2018-10-29 NOTE — Telephone Encounter (Signed)
Please let the patient know that his vitamin b12, folate, tsh, and rpr all came back normal. Will need to wait on the MRI for further evaluation of his dizziness. I dont see where the MRI was actually scheduled. He has hardware in place and radiology was going to let us know if it was ok for him to get an MRI. If we can check on the status of that, that would be great.  Guadalupe Dawn MD PGY-3 Family Medicine Resident

## 2018-11-01 NOTE — Telephone Encounter (Signed)
LMOVM informing pt of normal labs and reminded him of his MRI appt. Deseree Kennon Holter, CMA

## 2018-11-07 ENCOUNTER — Other Ambulatory Visit: Payer: Self-pay

## 2018-11-07 ENCOUNTER — Ambulatory Visit (HOSPITAL_COMMUNITY): Payer: Medicare Other

## 2018-11-07 MED ORDER — FUROSEMIDE 20 MG PO TABS: 20.0000 mg | ORAL_TABLET | Freq: Every day | ORAL | 0 refills | Status: DC

## 2018-11-07 NOTE — Telephone Encounter (Signed)
Patient called [facility] requesting a refill on Furosemide. It looks like Dr. Erin Hearing filled this in April.

## 2018-11-08 ENCOUNTER — Other Ambulatory Visit: Payer: Self-pay

## 2018-11-08 ENCOUNTER — Ambulatory Visit (HOSPITAL_COMMUNITY)
Admission: RE | Admit: 2018-11-08 | Discharge: 2018-11-08 | Disposition: A | Discharge status: Home / Self Care | Payer: Medicare Other | Source: Ambulatory Visit | Attending: Family Medicine | Admitting: Family Medicine

## 2018-11-08 DIAGNOSIS — R29818 Other symptoms and signs involving the nervous system: Secondary | ICD-10-CM | POA: Diagnosis not present

## 2018-11-08 DIAGNOSIS — R42 Dizziness and giddiness: Secondary | ICD-10-CM | POA: Diagnosis not present

## 2018-11-12 ENCOUNTER — Telehealth: Payer: Self-pay

## 2018-11-12 DIAGNOSIS — G935 Compression of brain: Secondary | ICD-10-CM

## 2018-11-12 NOTE — Telephone Encounter (Signed)
Patient calls nurse line requesting MRI results. Will forward to provider who ordered.

## 2018-11-13 NOTE — Telephone Encounter (Signed)
Pt calling again to get MRI results. Ottis Stain, CMA

## 2018-11-13 NOTE — Telephone Encounter (Signed)
Had long conversation with patient regarding results. 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 Very mild crowding of foramen magnum. Question whether the relatively mild finding could represent the very mild symptoms of balance issues, gait instability, dizziness patient has been experiencing.   On further questioning the patient feels these deficits have been present for much longer than the 3 months. States that he spent a large majority of his early life (up until around 10 years ago) addicting to alcohol and drugs. Symptoms have possibly had mild improvement in the meantime.  Will refer to neurosurgery for their opinion on possible treatment options and whether such a mild finding could be contributing to this patient's chronic gait instability, dizziness, and other issues.  Guadalupe Dawn MD PGY-3 Family Medicine Resident

## 2018-11-21 DIAGNOSIS — R519 Headache, unspecified: Secondary | ICD-10-CM | POA: Diagnosis not present

## 2018-12-18 ENCOUNTER — Ambulatory Visit (INDEPENDENT_AMBULATORY_CARE_PROVIDER_SITE_OTHER): Payer: Medicare Other | Admitting: *Deleted

## 2018-12-18 DIAGNOSIS — I422 Other hypertrophic cardiomyopathy: Secondary | ICD-10-CM

## 2018-12-18 DIAGNOSIS — I4819 Other persistent atrial fibrillation: Secondary | ICD-10-CM | POA: Diagnosis not present

## 2018-12-19 LAB — CUP PACEART REMOTE DEVICE CHECK
Battery Remaining Longevity: 144 mo
Battery Remaining Percentage: 100 %
Brady Statistic RV Percent Paced: 0 %
Date Time Interrogation Session: 20201118070133
HighPow Impedance: 73 Ohm
Implantable Lead Implant Date: 20160218
Implantable Lead Location: 753860
Implantable Lead Model: 292
Implantable Lead Serial Number: 358834
Implantable Pulse Generator Implant Date: 20160218
Lead Channel Impedance Value: 864 Ohm
Lead Channel Pacing Threshold Amplitude: 0.7 V
Lead Channel Pacing Threshold Pulse Width: 0.5 ms
Lead Channel Setting Pacing Amplitude: 2 V
Lead Channel Setting Pacing Pulse Width: 0.5 ms
Lead Channel Setting Sensing Sensitivity: 0.3 mV
Pulse Gen Serial Number: 202913

## 2019-01-08 DIAGNOSIS — G4733 Obstructive sleep apnea (adult) (pediatric): Secondary | ICD-10-CM | POA: Diagnosis not present

## 2019-01-11 ENCOUNTER — Telehealth: Payer: Self-pay | Admitting: *Deleted

## 2019-01-11 ENCOUNTER — Other Ambulatory Visit: Payer: Self-pay | Admitting: Cardiology

## 2019-01-11 DIAGNOSIS — I480 Paroxysmal atrial fibrillation: Secondary | ICD-10-CM

## 2019-01-11 MED ORDER — FUROSEMIDE 20 MG PO TABS: 20.0000 mg | ORAL_TABLET | Freq: Every day | ORAL | 6 refills | Status: DC

## 2019-01-11 MED ORDER — METOPROLOL SUCCINATE ER 50 MG PO TB24: ORAL_TABLET | ORAL | 6 refills | Status: DC

## 2019-01-11 MED ORDER — DILTIAZEM HCL ER COATED BEADS 240 MG PO CP24: 240.0000 mg | ORAL_CAPSULE | Freq: Every day | ORAL | 6 refills | Status: DC

## 2019-01-11 MED ORDER — ATORVASTATIN CALCIUM 40 MG PO TABS: 40.0000 mg | ORAL_TABLET | Freq: Every day | ORAL | 6 refills | Status: DC

## 2019-01-11 MED ORDER — LOSARTAN POTASSIUM 100 MG PO TABS: 100.0000 mg | ORAL_TABLET | Freq: Every day | ORAL | 6 refills | Status: DC

## 2019-01-11 NOTE — Telephone Encounter (Signed)
Prescription refill request for Xarelto received.   Last office visit: 08/08/2018, Telemedicine, [place] Weight: 127.4 kg Age: 53 y.o. Scr: 1.16, 02/21/2018 CrCl:134 ml/min  Prescription refill sent.

## 2019-01-11 NOTE — Telephone Encounter (Signed)
Spoke with patient who will be to changing mail order pharmacy with insurance company and for now  needed refills to go to local pharmacy for 30 day supply 6 refills.  Dr. Radford Pax patient that was seen 08-2018 Patient was scheduled for 6 month Virtual office visit with Radford Pax 02-11-2019

## 2019-01-14 NOTE — Progress Notes (Signed)
Remote ICD transmission.   

## 2019-01-28 ENCOUNTER — Telehealth: Payer: Self-pay | Admitting: *Deleted

## 2019-01-28 NOTE — Telephone Encounter (Signed)

## 2019-02-10 NOTE — Progress Notes (Signed)
Virtual Visit via Telephone Note   This visit type was conducted due to national recommendations for restrictions regarding the COVID-19 Pandemic (e.g. social distancing) in an effort to limit this patient's exposure and mitigate transmission in our community.  Due to his co-morbid illnesses, this patient is at least at moderate risk for complications without adequate follow up.  This format is felt to be most appropriate for this patient at this time.  All issues noted in this document were discussed and addressed.  A limited physical exam was performed with this format.  Please refer to the patient's chart for his consent to telehealth for [facility].   Evaluation Performed:  Follow-up visit  This visit type was conducted due to national recommendations for restrictions regarding the COVID-19 Pandemic (e.g. social distancing).  This format is felt to be most appropriate for this patient at this time.  All issues noted in this document were discussed and addressed.  No physical exam was performed (except for noted visual exam findings with Video Visits).  Please refer to the patient's chart (MyChart message for video visits and phone note for telephone visits) for the patient's consent to telehealth for [facility].  Date:  02/11/2019   ID:  [name], DOB 09/05/1965, MRN [id number]  Patient Location:  Home  Provider location:   [place]  PCP:  Carney Living, MD  Cardiologist:  Armanda Magic, MD Sleep Medicine:  Armanda Magic, MD Electrophysiologist:  Lorenso Courier, MD  Chief Complaint:  OSA  History of Present Illness:    Kyle Peterson is a 54 y.o. male who presents via audio/video conferencing for a telehealth visit today.    [name] a 53 y.o.malewith a hx of severe OSA with an AHI of 80.8/hr and underwent CPAP titration but due to ongoing events could not be titrated on CPAP and ultimately was placed on BiPAP at 20/16cm H2O. He also has a history  ofnonobstructiveCAD,PAF,HTN, HOCM s/p septal myectomy s/p AICD, hyperlipidemia, polysubstance abuse with heroin/cocaine and ETOH for 25 years but has been clean for over 10 years.  He is here today for followup and is doing well.  He is concerned that he has been having chest pressure similar to his CAD in the past and occurs several times monthly and occurs more when he is emotionally stressed.  He does not exercise or exert himself so he does not know if he gets any sx with exertion.  There are no associated sx of SOB, nausea or diaphoresis.  He has severe back problems that limit him from walking.  He has gained 15lbs since he has been sedentary.  He has some mild DOE but only when he has been pushing the grocery cart for a while.  He denies any PND, orthopnea, LE edema (unless he forgets his diuretic), dizziness, palpitations or syncope. He is compliant with hix meds and is tolerating meds with no SE.    He is doing well with his CPAP device and thinks that he has gotten used to it.  He tolerates the mask and feels the pressure is adequate.  Since going on CPAP he feels rested in the am and has no significant daytime sleepiness.  He denies any significant mouth or nasal dryness or nasal congestion.  He does not think that he snores.    The patient does not have symptoms concerning for COVID-19 infection (fever, chills, cough, or new shortness of breath).   Prior CV studies:   The following studies were reviewed  today:  PAP compliance download  Past Medical History:  Diagnosis Date  . AICD (automatic cardioverter/defibrillator) present    bostan scien  . Anginal pain (HCC)    ?heart pain   per dr Estill Dooms  . Anxiety   . Arthritis    ddd  . Ascending aortic aneurysm (HCC)    25mm by echo 08/2018  . CAD (coronary artery disease), native coronary artery 08/19/2015  . CHF (congestive heart failure) (HCC)   . Depression   . Diabetes mellitus without complication (HCC)    no meds  new dx   . Heart murmur   . High cholesterol   . Hypertension   . Myocardial infarction (HCC)   . OSA (obstructive sleep apnea)    Severe with AHI 80/hr- new bipap  . PAF (paroxysmal atrial fibrillation) (HCC) 08/19/2015  . PTSD (post-traumatic stress disorder)   . Stroke (HCC) 2016  . Substance abuse [facility])    Past Surgical History:  Procedure Laterality Date  . CARDIAC CATHETERIZATION     01/13/14 [facility]: LM NL, mild narrowing of proximal/mid LAD, mid LCX, proximal RCA. 30% ostial OM1. 40% distal RCA. Medical tx.   . CARPAL TUNNEL RELEASE Left 11/02/2015  . CERVICAL DISCECTOMY    . MYOMECTOMY    . SHOULDER ARTHROSCOPY WITH ROTATOR CUFF REPAIR Left 09/09/2016   Procedure: LEFT SHOULDER ARTHROSCOPY WITH MINI OPEN ROTATOR CUFF REPAIR;  Surgeon: Eldred Manges, MD;  Location: [facility] OR;  Service: Orthopedics;  Laterality: Left;  . TONSILLECTOMY       Current Meds  Medication Sig  . atorvastatin (LIPITOR) 40 MG tablet Take 1 tablet (40 mg total) by mouth daily.  Marland Kitchen diltiazem (CARDIZEM CD) 240 MG 24 hr capsule Take 1 capsule (240 mg total) by mouth daily.  . furosemide (LASIX) 20 MG tablet Take 1 tablet by mouth once daily  . losartan (COZAAR) 100 MG tablet Take 1 tablet (100 mg total) by mouth daily.  . metoprolol succinate (TOPROL-XL) 50 MG 24 hr tablet TAKE 1 TABLET  DAILY WITH OR IMMEDIATELY FOLLOWING A MEAL. (Patient taking differently: Take 100 mg by mouth daily. TAKE 1 TABLET  DAILY WITH OR IMMEDIATELY FOLLOWING A MEAL.)  . orphenadrine (NORFLEX) 100 MG tablet Take 100 mg by mouth as needed for muscle spasms.  Carlena Hurl 20 MG TABS tablet Take 1 tablet by mouth once daily     Allergies:   Ace inhibitors   Social History   Tobacco Use  . Smoking status: Current Every Day Smoker    Packs/day: 0.50    Years: 40.00    Pack years: 20.00    Types: Cigarettes  . Smokeless tobacco: Never Used  . Tobacco comment: Trying to cut back   Substance Use Topics  . Alcohol use: No     Alcohol/week: 0.0 standard drinks  . Drug use: Yes    Frequency: 3.0 times per week    Types: Marijuana    Comment: last 07/26/16     Family Hx: The patient's family history includes Alcohol abuse in his brother and father; Diabetes in his father and mother; Drug abuse in his brother; Heart disease in his father; Hypertension in his father and mother.  ROS:   Please see the history of present illness.     All other systems reviewed and are negative.   Labs/Other Tests and Data Reviewed:    Recent Labs: 02/21/2018: BUN 10; Creatinine, Ser 1.16; Potassium 4.5; Sodium 141 08/21/2018: ALT 19 10/24/2018: TSH 1.400  Recent Lipid Panel Lab Results  Component Value Date/Time   CHOL 125 08/21/2018 08:41 AM   TRIG 87 08/21/2018 08:41 AM   HDL 42 08/21/2018 08:41 AM   CHOLHDL 3.0 08/21/2018 08:41 AM   CHOLHDL 5.6 (H) 06/10/2015 09:24 AM   LDLCALC 66 08/21/2018 08:41 AM    Wt Readings from Last 3 Encounters:  02/11/19 285 lb (129.3 kg)  10/24/18 280 lb 12.8 oz (127.4 kg)  08/08/18 276 lb 9.6 oz (125.5 kg)     Objective:    Vital Signs:  Ht 5\' 8"  (1.727 m)   Wt 285 lb (129.3 kg)   BMI 43.33 kg/m     ASSESSMENT & PLAN:    1. ASCAD -cath 2015 at [facility] showed LM NL, mild narrowing of proximal/mid LAD, mid LCX, proximal RCA. 30% ostial OM1. 40% distal RCA -he has been having some chest pressure recently that is nonexertional and similar to when he had his cath but no associated sx -I will get a Lexiscan myoview to rule out ischemia -continue Toprol XL 100mg  daily and statin -no ASA due to DOAC -he continues to smoke and wants to quit but does not want to try nicotine gum or meds.    2. Persistent atrial Fibrillation -he has not had any palpitations -no bleeding problems on DOAC -Continue Toprol XL 100mg  daily, Cardizem CD 240mg  daily and Xarelto 20mg  daily -check BMET and CBC  3. HOCM -s/p septal myomectomy. -2D echo 08/2018 showed normal LVF with  mild LVH, G1DD, moderate RVE with normal RVF, mild to moderate MR  -he denies any syncopal episodes -s/p AICD - followed in device clinic  4. HTN -BP controlled -continue BB, Cardizem and Losartan 100mg  daily  5. Dilated ascending aorta - Ascending aorta dilated on echo at 17mm 09/2018 -repeat echo in 1 year to make sure this remains stable  6.OSA - The patient is tolerating PAP therapy well without any problems. The PAP download was reviewed today and showed an AHI of 2.2/hr on auto BIPAP  with 100% compliance in using more than 4 hours nightly.  The patient has been using and benefiting from PAP use and will continue to benefit from therapy.   7. Hyperlipidemia -LDL goal is <70.  -LDL 66 in July 2020 -continue Atorvastatin 40mg  daily  8.  Morbid obesity  - he is limited with exercise due to back problems  9.  Chronic diastolic CHF -his weight is stable -he denies any siginficant LE edema or SOB except when really exerting himself -continue lasix 20mg  daily   COVID-19 Education: The signs and symptoms of COVID-19 were discussed with the patient and how to seek care for testing (follow up with PCP or arrange E-visit).  The importance of social distancing was discussed today.  Patient Risk:   After full review of this patient's clinical status, I feel that they are at least moderate risk at this time.  Time:   Today, I have spent 20 minutes on telemedicine discussing medical problems including OSA, CAD, ascending aorta, HOCM, HTN, HLD.  We also reviewed the symptoms of COVID 19 and the ways to protect against contracting the virus with telehealth technology.  I spent an additional 5 minutes reviewing patient's chart including 2D echo, labs.  Medication Adjustments/Labs and Tests Ordered: Current medicines are reviewed at length with the patient today.  Concerns regarding medicines are outlined above.  Tests Ordered: No orders of the defined types were placed in  this encounter.  Medication Changes:  No orders of the defined types were placed in this encounter.   Disposition:  Follow up in 6 months  Signed, Fransico Him, MD  02/11/2019 1:42 PM    [facility]

## 2019-02-11 ENCOUNTER — Other Ambulatory Visit: Payer: Self-pay

## 2019-02-11 ENCOUNTER — Other Ambulatory Visit: Payer: Self-pay | Admitting: Family Medicine

## 2019-02-11 ENCOUNTER — Encounter: Payer: Self-pay | Admitting: Cardiology

## 2019-02-11 ENCOUNTER — Telehealth (INDEPENDENT_AMBULATORY_CARE_PROVIDER_SITE_OTHER): Payer: Medicare Other | Admitting: Cardiology

## 2019-02-11 VITALS — Ht 68.0 in | Wt 285.0 lb

## 2019-02-11 DIAGNOSIS — I712 Thoracic aortic aneurysm, without rupture: Secondary | ICD-10-CM | POA: Diagnosis not present

## 2019-02-11 DIAGNOSIS — I10 Essential (primary) hypertension: Secondary | ICD-10-CM | POA: Diagnosis not present

## 2019-02-11 DIAGNOSIS — I2510 Atherosclerotic heart disease of native coronary artery without angina pectoris: Secondary | ICD-10-CM | POA: Diagnosis not present

## 2019-02-11 DIAGNOSIS — E7800 Pure hypercholesterolemia, unspecified: Secondary | ICD-10-CM

## 2019-02-11 DIAGNOSIS — R072 Precordial pain: Secondary | ICD-10-CM

## 2019-02-11 DIAGNOSIS — I4819 Other persistent atrial fibrillation: Secondary | ICD-10-CM | POA: Diagnosis not present

## 2019-02-11 DIAGNOSIS — I422 Other hypertrophic cardiomyopathy: Secondary | ICD-10-CM

## 2019-02-11 DIAGNOSIS — I7121 Aneurysm of the ascending aorta, without rupture: Secondary | ICD-10-CM

## 2019-02-11 DIAGNOSIS — G4733 Obstructive sleep apnea (adult) (pediatric): Secondary | ICD-10-CM

## 2019-02-11 MED ORDER — LOSARTAN POTASSIUM 100 MG PO TABS: 100.0000 mg | ORAL_TABLET | Freq: Every day | ORAL | 3 refills | Status: DC

## 2019-02-11 MED ORDER — FUROSEMIDE 20 MG PO TABS: 20.0000 mg | ORAL_TABLET | Freq: Every day | ORAL | 3 refills | Status: DC

## 2019-02-11 NOTE — Patient Instructions (Signed)
Medication Instructions:  Your physician recommends that you continue on your current medications as directed. Please refer to the Current Medication list given to you today.  *If you need a refill on your cardiac medications before your next appointment, please call your pharmacy*  Lab Work: BMET and CBC to be done on same day as stress test.   If you have labs (blood work) drawn today and your tests are completely normal, you will receive your results only by: Marland Kitchen MyChart Message (if you have MyChart) OR . A paper copy in the mail If you have any lab test that is abnormal or we need to change your treatment, we will call you to review the results.  Testing/Procedures: Your physician has requested that you have a lexiscan myoview. For further information please visit [email or web address] Please follow instruction sheet, as given.  Your physician has requested that you have an echocardiogram in August 2021. Echocardiography is a painless test that uses sound waves to create images of your heart. It provides your doctor with information about the size and shape of your heart and how well your heart's chambers and valves are working. This procedure takes approximately one hour. There are no restrictions for this procedure.  Follow-Up: At [facility], you and your health needs are our priority.  As part of our continuing mission to provide you with exceptional heart care, we have created designated Provider Care Teams.  These Care Teams include your primary Cardiologist (physician) and Advanced Practice Providers (APPs -  Physician Assistants and Nurse Practitioners) who all work together to provide you with the care you need, when you need it.  Your next appointment:   6 month(s)  The format for your next appointment:   Either In Person or Virtual  Provider:   Armanda Magic, MD

## 2019-02-12 ENCOUNTER — Other Ambulatory Visit: Payer: Self-pay | Admitting: Family Medicine

## 2019-02-19 ENCOUNTER — Other Ambulatory Visit: Payer: Medicare Other

## 2019-02-19 ENCOUNTER — Ambulatory Visit (HOSPITAL_COMMUNITY): Payer: Medicare Other

## 2019-02-20 ENCOUNTER — Ambulatory Visit (HOSPITAL_COMMUNITY): Payer: Medicare Other

## 2019-03-08 ENCOUNTER — Telehealth: Payer: Self-pay | Admitting: Cardiology

## 2019-03-08 NOTE — Telephone Encounter (Signed)
Spoke with patient and informed the vaccine is recommended and he is encouraged to take it.  He is hoping to schedule his vaccine appointment next week.

## 2019-03-08 NOTE — Telephone Encounter (Signed)
Patient wanted to know if it will be safe for him to get the COVID vaccine.

## 2019-03-19 ENCOUNTER — Ambulatory Visit (INDEPENDENT_AMBULATORY_CARE_PROVIDER_SITE_OTHER): Payer: Medicare Other | Admitting: *Deleted

## 2019-03-19 DIAGNOSIS — I4819 Other persistent atrial fibrillation: Secondary | ICD-10-CM | POA: Diagnosis not present

## 2019-03-19 LAB — CUP PACEART REMOTE DEVICE CHECK
Battery Remaining Longevity: 144 mo
Battery Remaining Percentage: 100 %
Brady Statistic RV Percent Paced: 0 %
Date Time Interrogation Session: 20210216024200
HighPow Impedance: 63 Ohm
Implantable Lead Implant Date: 20160218
Implantable Lead Location: 753860
Implantable Lead Model: 292
Implantable Lead Serial Number: 358834
Implantable Pulse Generator Implant Date: 20160218
Lead Channel Impedance Value: 819 Ohm
Lead Channel Pacing Threshold Amplitude: 0.7 V
Lead Channel Pacing Threshold Pulse Width: 0.5 ms
Lead Channel Setting Pacing Amplitude: 2 V
Lead Channel Setting Pacing Pulse Width: 0.5 ms
Lead Channel Setting Sensing Sensitivity: 0.3 mV
Pulse Gen Serial Number: 202913

## 2019-03-20 NOTE — Progress Notes (Signed)
ICD Remote  

## 2019-04-25 ENCOUNTER — Ambulatory Visit: Payer: Medicare Other | Attending: Internal Medicine

## 2019-04-25 DIAGNOSIS — Z23 Encounter for immunization: Secondary | ICD-10-CM

## 2019-04-25 NOTE — Progress Notes (Signed)
   Covid-19 Vaccination Clinic  Name:  Kyle Peterson    MRN: [id number] DOB: July 22, 1965  04/25/2019  [name] was observed post Covid-19 immunization for 15 minutes without incident. He was provided with Vaccine Information Sheet and instruction to access the V-Safe system.   [name] was instructed to call 911 with any severe reactions post vaccine: Marland Kitchen Difficulty breathing  . Swelling of face and throat  . A fast heartbeat  . A bad rash all over body  . Dizziness and weakness   Immunizations Administered    Name Date Dose VIS Date Route   Pfizer COVID-19 Vaccine 04/25/2019  2:15 PM 0.3 mL 01/11/2019 Intramuscular   Manufacturer: [employer], [employer]   Lot: [id number]   NDC: [address]-6

## 2019-04-26 DIAGNOSIS — G4733 Obstructive sleep apnea (adult) (pediatric): Secondary | ICD-10-CM | POA: Diagnosis not present

## 2019-05-08 ENCOUNTER — Other Ambulatory Visit: Payer: Self-pay

## 2019-05-08 DIAGNOSIS — M549 Dorsalgia, unspecified: Secondary | ICD-10-CM

## 2019-05-20 ENCOUNTER — Ambulatory Visit: Payer: Medicare Other | Attending: Internal Medicine

## 2019-05-20 DIAGNOSIS — Z23 Encounter for immunization: Secondary | ICD-10-CM

## 2019-05-20 NOTE — Progress Notes (Signed)
   Covid-19 Vaccination Clinic  Name:  Kyle Peterson    MRN: [id number] DOB: [birth year]  05/20/2019  [name] was observed post Covid-19 immunization for 30 minutes based on pre-vaccination screening without incident. He was provided with Vaccine Information Sheet and instruction to access the V-Safe system.   [name] was instructed to call 911 with any severe reactions post vaccine: Marland Kitchen Difficulty breathing  . Swelling of face and throat  . A fast heartbeat  . A bad rash all over body  . Dizziness and weakness   Immunizations Administered    Name Date Dose VIS Date Route   Pfizer COVID-19 Vaccine 05/20/2019 12:07 PM 0.3 mL 03/27/2018 Intramuscular   Manufacturer: [employer], [employer]   Lot: [id number]   NDC: [address]-4

## 2019-06-13 ENCOUNTER — Telehealth: Payer: Self-pay | Admitting: *Deleted

## 2019-06-13 NOTE — Telephone Encounter (Signed)
No need to hold Xarelto for cleaning and 2 teeth extraction

## 2019-06-13 NOTE — Telephone Encounter (Signed)
Will need to know how many teeth are going to be extracted. We cannot give blanket clearances. Will need to know what the procedure is.

## 2019-06-13 NOTE — Telephone Encounter (Signed)
Left message for the dental office to please call our office back as we are needing to know how many teeth are expected to be extracted.

## 2019-06-13 NOTE — Telephone Encounter (Signed)
[facility] returned call to inform preop that patient is requesting clearance to have deep cleaning and 2 teeth extracted. If our office has additional questions for [facility] a call may be returned to [phone].

## 2019-06-13 NOTE — Telephone Encounter (Signed)
I will fax clearance notes to [facility] Office with recommendations. I will remove from the pre op call back pool.

## 2019-06-13 NOTE — Telephone Encounter (Signed)
PRIMARY [facility] Pre-operative Risk Assessment    Request for surgical clearance:  1. What type of surgery is being performed?  EXTRACTIONS, FILLINGS AND DEEP CLEANING   2. When is this surgery scheduled?  UNKNOWN- URGENT  3. What type of clearance is required (medical clearance vs. Pharmacy clearance to hold med vs. Both)? BOTH  4. Are there any medications that need to be held prior to surgery and how long? XARELTO 20 MG  5. Practice name and name of physician performing surgery?  [facility]   6. What is your office phone number [phone]   7.   What is your office fax number [phone]  8.   Anesthesia type (None, local, MAC, general) ?  Leonia Corona 06/13/2019, 12:57 PM  _________________________________________________________________   (provider comments below)

## 2019-06-18 ENCOUNTER — Ambulatory Visit (INDEPENDENT_AMBULATORY_CARE_PROVIDER_SITE_OTHER): Payer: Medicare Other | Admitting: *Deleted

## 2019-06-18 DIAGNOSIS — I422 Other hypertrophic cardiomyopathy: Secondary | ICD-10-CM | POA: Diagnosis not present

## 2019-06-18 DIAGNOSIS — I4819 Other persistent atrial fibrillation: Secondary | ICD-10-CM

## 2019-06-18 LAB — CUP PACEART REMOTE DEVICE CHECK
Battery Remaining Longevity: 144 mo
Battery Remaining Percentage: 100 %
Brady Statistic RV Percent Paced: 0 %
Date Time Interrogation Session: 20210518023100
HighPow Impedance: 71 Ohm
Implantable Lead Implant Date: 20160218
Implantable Lead Location: 753860
Implantable Lead Model: 292
Implantable Lead Serial Number: 358834
Implantable Pulse Generator Implant Date: 20160218
Lead Channel Impedance Value: 832 Ohm
Lead Channel Pacing Threshold Amplitude: 0.7 V
Lead Channel Pacing Threshold Pulse Width: 0.5 ms
Lead Channel Setting Pacing Amplitude: 2 V
Lead Channel Setting Pacing Pulse Width: 0.5 ms
Lead Channel Setting Sensing Sensitivity: 0.3 mV
Pulse Gen Serial Number: 202913

## 2019-06-19 NOTE — Progress Notes (Signed)
Remote ICD transmission.   

## 2019-06-21 ENCOUNTER — Telehealth: Payer: Self-pay

## 2019-06-21 NOTE — Telephone Encounter (Signed)
[facility] returned call to inform preop that patient is requesting clearance to have deep cleaning and 2 teeth extracted. If our office has additional questions for [facility] a call may be returned to [phone].  Clearance has been done but they are asking for the pt future appt if he needs SBE prior to Dental extractions, fillings, and deep cleanings.  Will forward to Dr. Mayford Knife for review.

## 2019-06-24 NOTE — Telephone Encounter (Signed)
[facility] is following up. Does the patient need pre medication prior to appt tomorrow?

## 2019-06-24 NOTE — Telephone Encounter (Signed)
I will send to Pre Op for further direction in regards to SBE. RN did also route to Dr. Mayford Knife in regards to SBE.

## 2019-06-25 NOTE — Telephone Encounter (Signed)
I have spoken to Carmelina Dane NP, with EP. She recommended SBE prophylaxis. This would be Amoxacillin 2 gms 30-minutes before procedure. I have checked and he does not have PCN allergy.      Primary Cardiologist: Will Jorja Loa, MD  Chart reviewed as part of pre-operative protocol coverage. Simple dental extractions are considered low risk procedures per guidelines and generally do not require any specific cardiac clearance. It is also generally accepted that for simple extractions and dental cleanings, there is no need to interrupt blood thinner therapy.   SBE prophylaxis is required for the patient.  I will route this recommendation to the requesting party via Epic fax function and remove from pre-op pool.  Please call with questions.  Joni Reining, NP 06/25/2019, 10:23 AM

## 2019-07-01 IMAGING — NM NM MISC PROCEDURE
6 series · 36 of 36 positions shown · non-contrast
Comparison: none

[Series 1: rest · 6.51mm/px · 6 of 64 frames shown]
[frame 6/64]
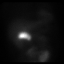
[frame 16/64]
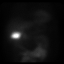
[frame 27/64]
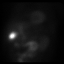
[frame 38/64]
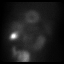
[frame 48/64]
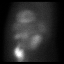
[frame 59/64]
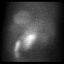

[Series 1: wbr_r-proj_st rest · 6.51mm/px · 6 of 64 frames shown]
[frame 6/64]
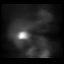
[frame 16/64]
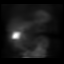
[frame 27/64]
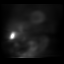
[frame 38/64]
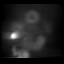
[frame 48/64]
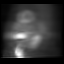
[frame 59/64]
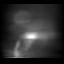

[Series 2: wbr_s-proj_st stress · 6.51mm/px · 6 of 512 frames shown (1 of 2)]
[frame 43/512]
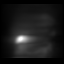
[frame 128/512]
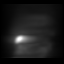
[frame 214/512]
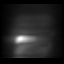
[frame 299/512]
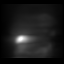
[frame 384/512]
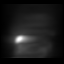
[frame 470/512]
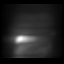

[Series 2: stress · 6.51mm/px · 6 of 512 frames shown (1 of 2)]
[frame 43/512]
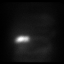
[frame 128/512]
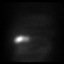
[frame 214/512]
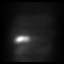
[frame 299/512]
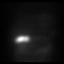
[frame 384/512]
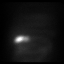
[frame 470/512]
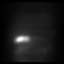

[Series 2: stress · 6.51mm/px · 6 of 64 frames shown (2 of 2)]
[frame 6/64]
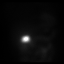
[frame 16/64]
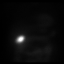
[frame 27/64]
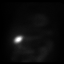
[frame 38/64]
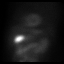
[frame 48/64]
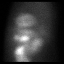
[frame 59/64]
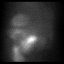

[Series 2: wbr_s-proj_st stress · 6.51mm/px · 6 of 64 frames shown (2 of 2)]
[frame 6/64]
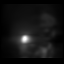
[frame 16/64]
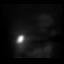
[frame 27/64]
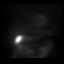
[frame 38/64]
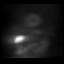
[frame 48/64]
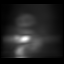
[frame 59/64]
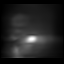

[36 of 36 positions shown; findings below may reference images not displayed]

Canned report from images found in remote index.

Refer to host system for actual result text.

## 2019-07-17 ENCOUNTER — Encounter (HOSPITAL_COMMUNITY): Payer: Self-pay | Admitting: *Deleted

## 2019-07-17 ENCOUNTER — Telehealth (HOSPITAL_COMMUNITY): Payer: Self-pay | Admitting: *Deleted

## 2019-07-17 NOTE — Telephone Encounter (Signed)
Patient given detailed instructions per Myocardial Perfusion Study Information Sheet for the test on 07/24/2019 at 1300. Patient notified to arrive 15 minutes early and that it is imperative to arrive on time for appointment to keep from having the test rescheduled.  If you need to cancel or reschedule your appointment, please call the office within 24 hours of your appointment. . Patient verbalized understanding.Kyle Peterson, Adelene Idler Mychart letter sent with instructions.

## 2019-07-24 ENCOUNTER — Other Ambulatory Visit: Payer: Self-pay

## 2019-07-24 ENCOUNTER — Ambulatory Visit (HOSPITAL_COMMUNITY): Payer: Medicare Other | Attending: Cardiology

## 2019-07-24 DIAGNOSIS — R072 Precordial pain: Secondary | ICD-10-CM | POA: Diagnosis not present

## 2019-07-24 MED ORDER — REGADENOSON 0.4 MG/5ML IV SOLN
0.4000 mg | Freq: Once | INTRAVENOUS | Status: AC
  Administered 2019-07-24: 0.4 mg via INTRAVENOUS

## 2019-07-24 MED ORDER — TECHNETIUM TC 99M TETROFOSMIN IV KIT
32.9000 | PACK | Freq: Once | INTRAVENOUS | Status: AC | PRN
  Administered 2019-07-24: 32.9 via INTRAVENOUS
  Filled 2019-07-24: qty 33

## 2019-07-25 ENCOUNTER — Ambulatory Visit (HOSPITAL_COMMUNITY): Payer: Medicare Other | Attending: Cardiovascular Disease

## 2019-07-25 LAB — MYOCARDIAL PERFUSION IMAGING
LV dias vol: 170 mL (ref 62–150)
LV sys vol: 87 mL
Peak HR: 71 {beats}/min
Rest HR: 56 {beats}/min
SDS: 0
SRS: 2
SSS: 2
TID: 0.98

## 2019-07-25 MED ORDER — TECHNETIUM TC 99M TETROFOSMIN IV KIT
30.7000 | PACK | Freq: Once | INTRAVENOUS | Status: AC | PRN
  Administered 2019-07-25: 30.7 via INTRAVENOUS
  Filled 2019-07-25: qty 31

## 2019-07-26 ENCOUNTER — Telehealth: Payer: Self-pay | Admitting: Cardiology

## 2019-07-26 NOTE — Telephone Encounter (Signed)
Results have been sent through MyChart. Left message for patient to call back in regards to chest pain.

## 2019-07-26 NOTE — Telephone Encounter (Signed)
Patient returning Carly's call in regards to stress test results.

## 2019-08-10 ENCOUNTER — Other Ambulatory Visit: Payer: Self-pay | Admitting: Cardiology

## 2019-08-10 ENCOUNTER — Other Ambulatory Visit: Payer: Self-pay | Admitting: Family Medicine

## 2019-08-12 ENCOUNTER — Other Ambulatory Visit: Payer: Self-pay

## 2019-08-12 ENCOUNTER — Other Ambulatory Visit: Payer: Medicare Other | Admitting: *Deleted

## 2019-08-12 ENCOUNTER — Other Ambulatory Visit: Payer: Self-pay | Admitting: Cardiology

## 2019-08-12 ENCOUNTER — Other Ambulatory Visit: Payer: Self-pay | Admitting: Family Medicine

## 2019-08-12 DIAGNOSIS — G4733 Obstructive sleep apnea (adult) (pediatric): Secondary | ICD-10-CM

## 2019-08-12 DIAGNOSIS — I2510 Atherosclerotic heart disease of native coronary artery without angina pectoris: Secondary | ICD-10-CM

## 2019-08-12 DIAGNOSIS — I4819 Other persistent atrial fibrillation: Secondary | ICD-10-CM

## 2019-08-12 DIAGNOSIS — I480 Paroxysmal atrial fibrillation: Secondary | ICD-10-CM

## 2019-08-12 DIAGNOSIS — R072 Precordial pain: Secondary | ICD-10-CM

## 2019-08-12 DIAGNOSIS — I10 Essential (primary) hypertension: Secondary | ICD-10-CM

## 2019-08-12 DIAGNOSIS — E7800 Pure hypercholesterolemia, unspecified: Secondary | ICD-10-CM

## 2019-08-12 DIAGNOSIS — I422 Other hypertrophic cardiomyopathy: Secondary | ICD-10-CM

## 2019-08-12 DIAGNOSIS — I7121 Aneurysm of the ascending aorta, without rupture: Secondary | ICD-10-CM

## 2019-08-12 LAB — BASIC METABOLIC PANEL
BUN/Creatinine Ratio: 13 (ref 9–20)
BUN: 15 mg/dL (ref 6–24)
CO2: 25 mmol/L (ref 20–29)
Calcium: 8.8 mg/dL (ref 8.7–10.2)
Chloride: 102 mmol/L (ref 96–106)
Creatinine, Ser: 1.14 mg/dL (ref 0.76–1.27)
GFR calc Af Amer: 84 mL/min/{1.73_m2} (ref >59)
GFR calc non Af Amer: 73 mL/min/{1.73_m2} (ref >59)
Glucose: 249 mg/dL — ABNORMAL HIGH (ref 65–99)
Potassium: 4.4 mmol/L (ref 3.5–5.2)
Sodium: 139 mmol/L (ref 134–144)

## 2019-08-12 LAB — CBC
Hematocrit: 47.2 % (ref 37.5–51.0)
Hemoglobin: 16 g/dL (ref 13.0–17.7)
MCH: 31 pg (ref 26.6–33.0)
MCHC: 33.9 g/dL (ref 31.5–35.7)
MCV: 92 fL (ref 79–97)
Platelets: 189 10*3/uL (ref 150–450)
RBC: 5.16 x10E6/uL (ref 4.14–5.80)
RDW: 12.7 % (ref 11.6–15.4)
WBC: 9.6 10*3/uL (ref 3.4–10.8)

## 2019-08-12 NOTE — Telephone Encounter (Signed)
Prescription refill request for Xarelto received.   Last office visit: [place], 02/11/2019 Weight: 129.3 kg Age: 54 y.o. Scr: 1.16, 02/21/2018 CrCl:  Pt coming in 7/12 for blood work.

## 2019-08-15 ENCOUNTER — Other Ambulatory Visit: Payer: Self-pay | Admitting: Physician Assistant

## 2019-08-15 ENCOUNTER — Telehealth: Payer: Self-pay

## 2019-08-15 DIAGNOSIS — G8929 Other chronic pain: Secondary | ICD-10-CM

## 2019-08-15 DIAGNOSIS — Z95810 Presence of automatic (implantable) cardiac defibrillator: Secondary | ICD-10-CM | POA: Insufficient documentation

## 2019-08-15 DIAGNOSIS — I340 Nonrheumatic mitral (valve) insufficiency: Secondary | ICD-10-CM | POA: Insufficient documentation

## 2019-08-15 DIAGNOSIS — Z7901 Long term (current) use of anticoagulants: Secondary | ICD-10-CM | POA: Insufficient documentation

## 2019-08-15 MED ORDER — RIVAROXABAN 20 MG PO TABS: 20.0000 mg | ORAL_TABLET | Freq: Every day | ORAL | 5 refills | Status: DC

## 2019-08-15 MED ORDER — FUROSEMIDE 20 MG PO TABS: 20.0000 mg | ORAL_TABLET | Freq: Every day | ORAL | 1 refills | Status: DC

## 2019-08-15 NOTE — Telephone Encounter (Signed)
Spoke with patient to review his medications before getting him scheduled for a myelogram.  He states an understanding he will be here two hours, will need a driver and will need to be on strict bedrest for 24 hours after the procedure.  He also states an understanding he needs to hold Bupropion for 48 hours before, and 24 hours after, the myelogram.

## 2019-08-15 NOTE — Telephone Encounter (Signed)
Pt last saw Dr Mayford Knife 02/11/19 telemedicine Covid-19, last labs 08/12/19 creat 1.14, age 54, weight 129.3kg, CrCl 137.05, based on CrCl pt is on appropriate dosage of Xarelto 20mg  QD.  Will refill rx.

## 2019-08-23 ENCOUNTER — Other Ambulatory Visit: Payer: Self-pay

## 2019-08-23 ENCOUNTER — Ambulatory Visit
Admission: RE | Admit: 2019-08-23 | Discharge: 2019-08-23 | Disposition: A | Discharge status: Home / Self Care | Payer: Medicare Other | Source: Ambulatory Visit | Attending: Physician Assistant | Admitting: Physician Assistant

## 2019-08-23 DIAGNOSIS — M5442 Lumbago with sciatica, left side: Secondary | ICD-10-CM

## 2019-08-23 DIAGNOSIS — G8929 Other chronic pain: Secondary | ICD-10-CM

## 2019-08-23 MED ORDER — DIAZEPAM 5 MG PO TABS
10.0000 mg | ORAL_TABLET | Freq: Once | ORAL | Status: AC
  Administered 2019-08-23: 10 mg via ORAL

## 2019-08-23 MED ORDER — IOPAMIDOL (ISOVUE-M 200) INJECTION 41%
20.0000 mL | Freq: Once | INTRAMUSCULAR | Status: AC
  Administered 2019-08-23: 20 mL via INTRATHECAL

## 2019-08-23 NOTE — Discharge Instructions (Signed)
Myelogram Discharge Instructions  1. Go home and rest quietly for the next 24 hours.  It is important to lie flat for the next 24 hours.  Get up only to go to the restroom.  You may lie in the bed or on a couch on your back, your stomach, your left side or your right side.  You may have one pillow under your head.  You may have pillows between your knees while you are on your side or under your knees while you are on your back.  2. DO NOT drive today.  Recline the seat as far back as it will go, while still wearing your seat belt, on the way home.  3. You may get up to go to the bathroom as needed.  You may sit up for 10 minutes to eat.  You may resume your normal diet and medications unless otherwise indicated.  Drink lots of extra fluids today and tomorrow.  4. The incidence of headache, nausea, or vomiting is about 5% (one in 20 patients).  If you develop a headache, lie flat and drink plenty of fluids until the headache goes away.  Caffeinated beverages may be helpful.  If you develop severe nausea and vomiting or a headache that does not go away with flat bed rest, call [phone].  5. You may resume normal activities after your 24 hours of bed rest is over; however, do not exert yourself strongly or do any heavy lifting tomorrow. If when you get up you have a headache when standing, go back to bed and force fluids for another 24 hours.  6. Call your physician for a follow-up appointment.  The results of your myelogram will be sent directly to your physician by the following day.  7. If you have any questions or if complications develop after you arrive home, please call [phone].  Discharge instructions have been explained to the patient.  The patient, or the person responsible for the patient, fully understands these instructions.  YOU MAY RESTART YOUR [facility] TOMORROW 08/24/19 AT 10:30AM.

## 2019-09-11 ENCOUNTER — Other Ambulatory Visit: Payer: Self-pay | Admitting: Family Medicine

## 2019-09-11 ENCOUNTER — Other Ambulatory Visit: Payer: Self-pay | Admitting: Cardiology

## 2019-09-11 ENCOUNTER — Other Ambulatory Visit (HOSPITAL_COMMUNITY): Payer: Medicare Other

## 2019-09-11 DIAGNOSIS — I480 Paroxysmal atrial fibrillation: Secondary | ICD-10-CM

## 2019-09-11 NOTE — Telephone Encounter (Signed)
Prescription refill request for Xarelto received.   Last office visit: 02/11/2019 Weight: 129.3 kg Age: 54 y.o. Scr: 1.14, 08/12/2019 CrCl: 137 ml/min   Prescription refill sent.

## 2019-09-17 ENCOUNTER — Ambulatory Visit (INDEPENDENT_AMBULATORY_CARE_PROVIDER_SITE_OTHER): Payer: Medicare Other | Admitting: *Deleted

## 2019-09-17 DIAGNOSIS — I422 Other hypertrophic cardiomyopathy: Secondary | ICD-10-CM | POA: Diagnosis not present

## 2019-09-17 LAB — CUP PACEART REMOTE DEVICE CHECK
Battery Remaining Longevity: 144 mo
Battery Remaining Percentage: 100 %
Brady Statistic RV Percent Paced: 0 %
Date Time Interrogation Session: 20210817023100
HighPow Impedance: 67 Ohm
Implantable Lead Implant Date: 20160218
Implantable Lead Location: 753860
Implantable Lead Model: 292
Implantable Lead Serial Number: 358834
Implantable Pulse Generator Implant Date: 20160218
Lead Channel Impedance Value: 838 Ohm
Lead Channel Pacing Threshold Amplitude: 0.7 V
Lead Channel Pacing Threshold Pulse Width: 0.5 ms
Lead Channel Setting Pacing Amplitude: 2 V
Lead Channel Setting Pacing Pulse Width: 0.5 ms
Lead Channel Setting Sensing Sensitivity: 0.3 mV
Pulse Gen Serial Number: 202913

## 2019-09-19 NOTE — Progress Notes (Signed)
Remote ICD transmission.   

## 2019-09-23 ENCOUNTER — Telehealth (HOSPITAL_COMMUNITY): Payer: Self-pay | Admitting: Cardiology

## 2019-09-23 NOTE — Telephone Encounter (Signed)
09/23/19 patient cancelled echocardiogram  due to all the press about the RN and staff not wanting to get the vaccine at the [facility] facilities. He states he doesnt want to risk it. He will call at a later date to reschedule. [facility]W

## 2019-09-25 ENCOUNTER — Other Ambulatory Visit (HOSPITAL_COMMUNITY): Payer: Medicare Other

## 2019-10-22 ENCOUNTER — Encounter: Payer: Self-pay | Admitting: Cardiology

## 2019-10-22 ENCOUNTER — Ambulatory Visit (HOSPITAL_COMMUNITY): Payer: Medicare Other | Attending: Cardiovascular Disease

## 2019-10-22 ENCOUNTER — Other Ambulatory Visit: Payer: Self-pay

## 2019-10-22 DIAGNOSIS — I10 Essential (primary) hypertension: Secondary | ICD-10-CM

## 2019-10-22 DIAGNOSIS — R072 Precordial pain: Secondary | ICD-10-CM | POA: Diagnosis present

## 2019-10-22 DIAGNOSIS — I422 Other hypertrophic cardiomyopathy: Secondary | ICD-10-CM | POA: Diagnosis not present

## 2019-10-22 DIAGNOSIS — I2510 Atherosclerotic heart disease of native coronary artery without angina pectoris: Secondary | ICD-10-CM | POA: Diagnosis not present

## 2019-10-22 DIAGNOSIS — G4733 Obstructive sleep apnea (adult) (pediatric): Secondary | ICD-10-CM | POA: Diagnosis present

## 2019-10-22 DIAGNOSIS — I4819 Other persistent atrial fibrillation: Secondary | ICD-10-CM | POA: Insufficient documentation

## 2019-10-22 DIAGNOSIS — E7800 Pure hypercholesterolemia, unspecified: Secondary | ICD-10-CM | POA: Insufficient documentation

## 2019-10-22 DIAGNOSIS — I712 Thoracic aortic aneurysm, without rupture: Secondary | ICD-10-CM

## 2019-10-22 DIAGNOSIS — I7121 Aneurysm of the ascending aorta, without rupture: Secondary | ICD-10-CM

## 2019-10-22 LAB — ECHOCARDIOGRAM COMPLETE
Area-P 1/2: 3.68 cm2
P 1/2 time: 297 msec
S' Lateral: 3.9 cm

## 2019-10-23 ENCOUNTER — Telehealth (INDEPENDENT_AMBULATORY_CARE_PROVIDER_SITE_OTHER): Payer: Medicare Other | Admitting: Cardiology

## 2019-10-23 ENCOUNTER — Telehealth: Payer: Self-pay | Admitting: Cardiology

## 2019-10-23 ENCOUNTER — Encounter: Payer: Self-pay | Admitting: Cardiology

## 2019-10-23 VITALS — BP 175/90 | Ht 68.0 in | Wt 290.6 lb

## 2019-10-23 DIAGNOSIS — I10 Essential (primary) hypertension: Secondary | ICD-10-CM | POA: Diagnosis not present

## 2019-10-23 DIAGNOSIS — I4819 Other persistent atrial fibrillation: Secondary | ICD-10-CM

## 2019-10-23 DIAGNOSIS — I422 Other hypertrophic cardiomyopathy: Secondary | ICD-10-CM

## 2019-10-23 DIAGNOSIS — G4733 Obstructive sleep apnea (adult) (pediatric): Secondary | ICD-10-CM

## 2019-10-23 DIAGNOSIS — I2510 Atherosclerotic heart disease of native coronary artery without angina pectoris: Secondary | ICD-10-CM | POA: Diagnosis not present

## 2019-10-23 DIAGNOSIS — I712 Thoracic aortic aneurysm, without rupture: Secondary | ICD-10-CM

## 2019-10-23 DIAGNOSIS — I7121 Aneurysm of the ascending aorta, without rupture: Secondary | ICD-10-CM

## 2019-10-23 DIAGNOSIS — E7800 Pure hypercholesterolemia, unspecified: Secondary | ICD-10-CM

## 2019-10-23 DIAGNOSIS — I5032 Chronic diastolic (congestive) heart failure: Secondary | ICD-10-CM

## 2019-10-23 NOTE — Telephone Encounter (Signed)
New Message    [facility] return called to Kyle Peterson call.  The Direct number is [phone] , she stated 1pm would be best

## 2019-10-23 NOTE — Telephone Encounter (Signed)
Spoke with [facility] and the most recent lipids that they have are from 05/2019, they will fax them over to [place].

## 2019-10-23 NOTE — Patient Instructions (Signed)
Please check your blood pressure twice a day and call us or send [place] a MyChart message with your readings.   Medication Instructions:  Your physician recommends that you continue on your current medications as directed. Please refer to the Current Medication list given to you today.  *If you need a refill on your cardiac medications before your next appointment, please call your pharmacy*  Follow-Up: At [facility], you and your health needs are our priority.  As part of our continuing mission to provide you with exceptional heart care, we have created designated Provider Care Teams.  These Care Teams include your primary Cardiologist (physician) and Advanced Practice Providers (APPs -  Physician Assistants and Nurse Practitioners) who all work together to provide you with the care you need, when you need it.  Your next appointment:   6 month(s)  The format for your next appointment:   In Person  Provider:   You may see Armanda Magic, MD or one of the following Advanced Practice Providers on your designated Care Team:    Ronie Spies, PA-C  Jacolyn Reedy, PA-C

## 2019-10-23 NOTE — Progress Notes (Signed)
Virtual Visit via Telephone Note   This visit type was conducted due to national recommendations for restrictions regarding the COVID-19 Pandemic (e.g. social distancing) in an effort to limit this patient's exposure and mitigate transmission in our community.  Due to his co-morbid illnesses, this patient is at least at moderate risk for complications without adequate follow up.  This format is felt to be most appropriate for this patient at this time.  All issues noted in this document were discussed and addressed.  A limited physical exam was performed with this format.  Please refer to the patient's chart for his consent to telehealth for [facility].   Evaluation Performed:  Follow-up visit  This visit type was conducted due to national recommendations for restrictions regarding the COVID-19 Pandemic (e.g. social distancing).  This format is felt to be most appropriate for this patient at this time.  All issues noted in this document were discussed and addressed.  No physical exam was performed (except for noted visual exam findings with Video Visits).  Please refer to the patient's chart (MyChart message for video visits and phone note for telephone visits) for the patient's consent to telehealth for [facility].  Date:  10/23/2019   ID:  Kyle Peterson, DOB October 07, 1965, MRN [id number]  Patient Location:  Home  Provider location:   [place]  PCP:  Raymon Mutton., FNP  Cardiologist:  Armanda Magic, MD Sleep Medicine:  Armanda Magic, MD Electrophysiologist:  Lorenso Courier, MD  Chief Complaint:  OSA  History of Present Illness:    Kyle Peterson is a 54 y.o. male who presents via audio/video conferencing for a telehealth visit today.    [name] a 54 y.o.malewith a hx of severe OSA with an AHI of 80.8/hr and underwent CPAP titration but due to ongoing events could not be titrated on CPAP and ultimately was placed on BiPAP at 20/16cm H2O. He also has a history  ofnonobstructiveCAD,PAF,HTN, HOCM s/p septal myectomy s/p AICD, hyperlipidemia, polysubstance abuse with heroin/cocaine and ETOH for 25 years but has been clean for over 10 years.  When I last saw him he was complaining of chest pain and a nuclear stress test was done and showed no ischemia.    He is here today for followup and is doing well.  He still has episodic "pin pricking" for 3 stabs at a time and then goes away.  He denies any anginal chest pain or pressure, SOB, DOE, PND, orthopnea, LE edema,  or syncope. He is compliant with his meds and is tolerating meds with no SE.  He has cut back on smoking to 1/2ppd and is continuing to cut back. A few weeks ago he had a fluttering sensation in his chest but very brief.  He has had a few more episodes of what he calls a "kaleidescope" vision.  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    He is doing well with his CPAP device and thinks that he has gotten used to it.  He tolerates the mask and feels the pressure is adequate.  Since going on CPAP he feels rested in the am and has no significant daytime sleepiness.  He denies any significant mouth or nasal dryness or nasal congestion.  He does not think that he snores.    The patient does not have symptoms concerning for COVID-19 infection (fever, chills, cough, or new shortness of breath).  Prior CV studies:   The following studies were reviewed today:  PAP compliance download, Steffanie DunnLexiscan myoview  Past Medical History:  Diagnosis Date  . AICD (automatic cardioverter/defibrillator) present    bostan scien  . Anginal pain (HCC)    ?heart pain   per dr Estill Doomscamntiz  . Anxiety   . Arthritis    ddd  . Ascending aortic aneurysm (HCC)    38mm by echo 2021  . CAD (coronary artery disease), native coronary artery 08/19/2015  . CHF (congestive heart failure) (HCC)   . Depression   . Diabetes mellitus without complication (HCC)    no  meds  new dx  . Heart murmur   . High cholesterol   . Hypertension   . Myocardial infarction (HCC)   . OSA (obstructive sleep apnea)    Severe with AHI 80/hr- new bipap  . PAF (paroxysmal atrial fibrillation) (HCC) 08/19/2015  . PTSD (post-traumatic stress disorder)   . Stroke (HCC) 2016  . Substance abuse [facility](HCC)    Past Surgical History:  Procedure Laterality Date  . CARDIAC CATHETERIZATION     01/13/14 [facility]: LM NL, mild narrowing of proximal/mid LAD, mid LCX, proximal RCA. 30% ostial OM1. 40% distal RCA. Medical tx.   . CARPAL TUNNEL RELEASE Left 11/02/2015  . CERVICAL DISCECTOMY    . MYOMECTOMY    . SHOULDER ARTHROSCOPY WITH ROTATOR CUFF REPAIR Left 09/09/2016   Procedure: LEFT SHOULDER ARTHROSCOPY WITH MINI OPEN ROTATOR CUFF REPAIR;  Surgeon: Eldred MangesYates, Mark C, MD;  Location: [facility] OR;  Service: Orthopedics;  Laterality: Left;  . TONSILLECTOMY       Current Meds  Medication Sig  . albuterol (VENTOLIN HFA) 108 (90 Base) MCG/ACT inhaler INHALE 1 TO 2 PUFFS BY MOUTH EVERY 6 HOURS AS NEEDED  . atorvastatin (LIPITOR) 40 MG tablet Take 1 tablet (40 mg total) by mouth daily.  Marland Kitchen. buPROPion (WELLBUTRIN SR) 150 MG 12 hr tablet Take 150 mg by mouth daily.   Marland Kitchen. diltiazem (CARDIZEM CD) 240 MG 24 hr capsule Take 1 capsule (240 mg total) by mouth daily.  . furosemide (LASIX) 20 MG tablet Take 1 tablet (20 mg total) by mouth daily.  Marland Kitchen. gabapentin (NEURONTIN) 300 MG capsule Take 300 mg by mouth daily.   Marland Kitchen. HYDROcodone-acetaminophen (NORCO/VICODIN) 5-325 MG tablet Take 1 tablet by mouth every 6 (six) hours as needed for moderate pain.  Marland Kitchen. losartan (COZAAR) 100 MG tablet Take 1 tablet by mouth once daily  . metoprolol succinate (TOPROL-XL) 50 MG 24 hr tablet TAKE 1 TABLET  DAILY WITH OR IMMEDIATELY FOLLOWING A MEAL. (Patient taking differently: Take 50 mg by mouth daily. TAKE 1 TABLET  DAILY WITH OR IMMEDIATELY FOLLOWING A MEAL.)  . montelukast (SINGULAIR) 10 MG tablet SMARTSIG:1 Tablet(s) By Mouth  Every Evening  . orphenadrine (NORFLEX) 100 MG tablet Take 100 mg by mouth as needed for muscle spasms.  Carlena Hurl. XARELTO 20 MG TABS tablet Take 1 tablet by mouth once daily     Allergies:   Ace inhibitors   Social History   Tobacco Use  . Smoking status: Current Every Day Smoker    Packs/day: 0.50    Years: 40.00    Pack years: 20.00    Types: Cigarettes  . Smokeless tobacco: Never Used  . Tobacco comment: Trying to cut back   Substance Use Topics  . Alcohol use: No    Alcohol/week: 0.0 standard drinks  . Drug use: Yes    Frequency: 3.0 times per week  Types: Marijuana    Comment: last 07/26/16     Family Hx: The patient's family history includes Alcohol abuse in his brother and father; Diabetes in his father and mother; Drug abuse in his brother; Heart disease in his father; Hypertension in his father and mother.  ROS:   Please see the history of present illness.     All other systems reviewed and are negative.   Labs/Other Tests and Data Reviewed:    Recent Labs: 10/24/2018: TSH 1.400 08/12/2019: BUN 15; Creatinine, Ser 1.14; Hemoglobin 16.0; Platelets 189; Potassium 4.4; Sodium 139   Recent Lipid Panel Lab Results  Component Value Date/Time   CHOL 125 08/21/2018 08:41 AM   TRIG 87 08/21/2018 08:41 AM   HDL 42 08/21/2018 08:41 AM   CHOLHDL 3.0 08/21/2018 08:41 AM   CHOLHDL 5.6 (H) 06/10/2015 09:24 AM   LDLCALC 66 08/21/2018 08:41 AM    Wt Readings from Last 3 Encounters:  10/23/19 290 lb 9.6 oz (131.8 kg)  07/24/19 285 lb (129.3 kg)  02/11/19 285 lb (129.3 kg)     Objective:    Vital Signs:  BP (!) 175/90   Ht 5\' 8"  (1.727 m)   Wt 290 lb 9.6 oz (131.8 kg)   BMI 44.19 kg/m     ASSESSMENT & PLAN:    1. ASCAD -cath 2015 at [facility] showed LM NL, mild narrowing of proximal/mid LAD, mid LCX, proximal RCA. 30% ostial OM1. 40% distal RCA -Lexiscan myoview in 07/2019 showed no ischemia>>done for atypical pin prick chest pain -continue Toprol XL  100mg  daily and statin -no ASA due to DOAC -he continues to cut back on smoking    2. Persistent atrial Fibrillation -he denies any palpitations -no bleeding problems on DOAC -Continue Toprol XL 100mg  daily, Cardizem CD 240mg  daily and Xarelto 20mg  daily -SCR was 1.14 and Hbg 16 in July 2021  3. HOCM -s/p septal myomectomy. -2D echo 08/2018 showed normal LVF with mild LVH, G1DD, moderate RVE with normal RVF, mild to moderate MR  -he has not had any syncope  -s/p AICD - followed in device clinic  4. HTN -BP poorly controlled this am but he just took his BP and had 2 cups of coffee  -continue Toprol XL 100mg  daily, Cardizem CD 240mg  daily and Losartan 100mg  daily -I have asked him to check his BP twice daily for a week and call with results  5. Dilated ascending aorta - Ascending aorta dilated on echo at 52mm 10/2019 -repeat echo in 1 year to make sure this remains stable  6.OSA -  The patient is tolerating PAP therapy well without any problems. The PAP download was reviewed today and showed an AHI of 1.3/hr on auto BiPAP with 77% compliance in using more than 4 hours nightly.  The patient has been using and benefiting from PAP use and will continue to benefit from therapy.   7. Hyperlipidemia -LDL goal is <70.  -LDL 66 in July 2020 -continue Atorvastatin 40mg  daily -check FLP and ALT  8.  Morbid obesity  - he is limited with exercise due to back problems  9.  Chronic diastolic CHF -his weight is stable -he has not had any SOB or LE edema -continue lasix 20mg  daily   COVID-19 Education: The signs and symptoms of COVID-19 were discussed with the patient and how to seek care for testing (follow up with PCP or arrange E-visit).  The importance of social distancing was discussed today.  Patient Risk:  After full review of this patient's clinical status, I feel that they are at least moderate risk at this time.  Time:   Today, I have spent 20 minutes on  telemedicine discussing medical problems including OSA, CAD, ascending aorta, HOCM, HTN, HLD and reviewing patient's chart including 2D echo, labs, PAP compliance download  Medication Adjustments/Labs and Tests Ordered: Current medicines are reviewed at length with the patient today.  Concerns regarding medicines are outlined above.  Tests Ordered: No orders of the defined types were placed in this encounter.  Medication Changes: No orders of the defined types were placed in this encounter.   Disposition:  Follow up in 6 months  Signed, Armanda Magic, MD  10/23/2019 8:31 AM    [facility]

## 2019-11-04 ENCOUNTER — Other Ambulatory Visit: Payer: Self-pay | Admitting: Cardiology

## 2019-11-26 ENCOUNTER — Telehealth: Payer: Self-pay

## 2019-11-26 NOTE — Telephone Encounter (Signed)
I think he is high risk to come off anticoagulation for procedure.  If he needs the procedure patient and Neurosurgery need to understand risk associated with holding anticoagulation from a cardioembolic standpoint and accept those risks.

## 2019-11-26 NOTE — Telephone Encounter (Signed)
Patient with diagnosis of afib on Xarelto for anticoagulation.    Procedure: ESI Date of procedure: 12/24/19  CHA2DS2-VASc Score = 6  This indicates a 9.7% annual risk of stroke. The patient's score is based upon: CHF History: 1 HTN History: 1 Diabetes History: 1 Stroke History: 2 Vascular Disease History: 1 Age Score: 0 Gender Score: 0  CrCl >170mL/min Platelet count 189K  Typically hold Xarelto for 3 days prior to [facility], however pt is at elevated risk off of anticoagulation due to hx of afib and stroke. Will defer to MD for input.

## 2019-11-26 NOTE — Telephone Encounter (Signed)
° °  [facility] Pre-operative Risk Assessment    HEARTCARE STAFF: - Please ensure there is not already an duplicate clearance open for this procedure. - Under Visit Info/Reason for Call, type in Other and utilize the format Clearance MM/DD/YY or Clearance TBD. Do not use dashes or single digits. - If request is for dental extraction, please clarify the # of teeth to be extracted.  Request for surgical clearance:  1. What type of surgery is being performed? Lumbar steroid injection    2. When is this surgery scheduled? 12/24/19   3. What type of clearance is required (medical clearance vs. Pharmacy clearance to hold med vs. Both)? Pharmacy  4. Are there any medications that need to be held prior to surgery and how long? Xarelto for 3 days prior   5. Practice name and name of physician performing surgery? [facility] Dr. Lenord Carbo   6. What is the office phone number? [phone]   7.   What is the office fax number? [phone]  8.   Anesthesia type (None, local, MAC, general) ? local   Mady Haagensen 11/26/2019, 10:33 AM  _________________________________________________________________   (provider comments below)

## 2019-11-26 NOTE — Telephone Encounter (Signed)
I cannot find in the records anywhere where the dx of CVA came from in 2016.  I do not see it in Care Everywhere and he had no ER visits for CVA - please look into this

## 2019-11-26 NOTE — Telephone Encounter (Addendum)
First mention of CVA I can find in Epic is in 07/26/16 note with PA from short stay/anesthesiology.  Called pt to clarify and he states he had a stroke some time before he had heart surgery at the [facility] in 2016. He cannot remember when specifically. No notes by [facility] mention stroke that I can see. Pt then also mentioned that a cardiologist who he used to see in [place] > 20 years ago told him that he had had a stroke as well. He didn't have many other details to provide so the history is unclear.

## 2019-11-26 NOTE — Telephone Encounter (Signed)
Pharmacy, can you please comment on how long patient can hold Xarelto for upcoming spinal injection?  Thank you!

## 2019-11-27 NOTE — Telephone Encounter (Signed)
Called and LV to callback and ask for preop pool 

## 2019-12-03 NOTE — Telephone Encounter (Signed)
Dr. Kathlene Cote office has sent over another clearance form for the same procedure.

## 2019-12-04 NOTE — Telephone Encounter (Signed)
Requesting office sent over another clearance request today. Looks like a call has been placed out to the pt and pre op team is waiting for the pt to call back. Pre Op Call Back may need to call the requesting office to inform that pre op team is waiting for a call back from the pt. I will send this to the pre op pool, and the pre op call back pool.

## 2019-12-06 NOTE — Telephone Encounter (Signed)
   Primary Cardiologist: Will Jorja Loa, MD  Chart reviewed as part of pre-operative protocol coverage. Patient was last seen by Dr. Mayford Knife for a telephone visit in 10/2019 at which time he was doing well from a cardiac standpoint. Patient was contacted today for further pre-op evaluation. He reports doing well since last visit. He continues to have occasional "pin prick" chest sensation that has previously been felt to be non-cardiac. No other chest pain concerning for angina. No chest pain. No acute CHF symptoms. Occasional brief palpitations but nothing sustained. No syncope.  Given past medical history and time since last visit, based on ACC/AHA guidelines, Kyle Peterson would be at acceptable risk for the planned procedure without further cardiovascular testing.   For spinal injections, Xarelto is typically held for 3 days. Per Dr. Mayford Knife: "I think he is high risk to come off anticoagulation for procedure.  If he needs the procedure patient and Neurosurgery need to understand risk associated with holding anticoagulation from a cardioembolic standpoint and accept those risks." I explained to patient that he is at high risk for embolic event coming off anticoagulation. He understands and accepts this risk. He states he has had multiple procedures in the past requiring him to come off anticoagulation. Please resume Xarelto as soon as possible following procedure.  I will route this recommendation to the requesting party via Epic fax function and remove from pre-op pool.  Please call with questions.  Corrin Parker, PA-C 12/06/2019, 10:24 AM

## 2019-12-06 NOTE — Telephone Encounter (Signed)
Called number listed in chart - automatic message said "number unable to accept calls at this time." Will send patient a MyChart message asking him to call us to speak with pre-op APP about upcoming spinal injection.

## 2019-12-06 NOTE — Telephone Encounter (Signed)
Call and spoke with [facility] and advised them we couldn't get in touch with pt, we exchange numbers on file form pt, pt number have been change, new # [phone], spoke with pt to let him know someone from our pre op team will be reaching out to him

## 2019-12-06 NOTE — Telephone Encounter (Signed)
Pre-op covering staff, can you please let requesting office know that we have been unable to reach patient. I tried calling patient again this morning and was unable to leave a message. I have sent him a MyChart message asking him to call and speak with pre-op team.  Thank you!

## 2019-12-17 ENCOUNTER — Ambulatory Visit (INDEPENDENT_AMBULATORY_CARE_PROVIDER_SITE_OTHER): Payer: Medicare Other

## 2019-12-17 DIAGNOSIS — I422 Other hypertrophic cardiomyopathy: Secondary | ICD-10-CM

## 2019-12-17 LAB — CUP PACEART REMOTE DEVICE CHECK
Battery Remaining Longevity: 144 mo
Battery Remaining Percentage: 100 %
Brady Statistic RV Percent Paced: 0 %
Date Time Interrogation Session: 20211116023000
HighPow Impedance: 66 Ohm
Implantable Lead Implant Date: 20160218
Implantable Lead Location: 753860
Implantable Lead Model: 292
Implantable Lead Serial Number: 358834
Implantable Pulse Generator Implant Date: 20160218
Lead Channel Impedance Value: 822 Ohm
Lead Channel Pacing Threshold Amplitude: 0.7 V
Lead Channel Pacing Threshold Pulse Width: 0.5 ms
Lead Channel Setting Pacing Amplitude: 2 V
Lead Channel Setting Pacing Pulse Width: 0.5 ms
Lead Channel Setting Sensing Sensitivity: 0.3 mV
Pulse Gen Serial Number: 202913

## 2019-12-18 NOTE — Progress Notes (Signed)
Remote ICD transmission.   

## 2019-12-24 ENCOUNTER — Other Ambulatory Visit: Payer: Self-pay

## 2019-12-24 ENCOUNTER — Encounter (HOSPITAL_COMMUNITY): Payer: Self-pay

## 2019-12-24 ENCOUNTER — Emergency Department (HOSPITAL_COMMUNITY)
Admission: EM | Admit: 2019-12-24 | Discharge: 2019-12-24 | Disposition: A | Discharge status: Home / Self Care | Payer: Medicare Other | Attending: Emergency Medicine | Admitting: Emergency Medicine

## 2019-12-24 DIAGNOSIS — Z7901 Long term (current) use of anticoagulants: Secondary | ICD-10-CM | POA: Diagnosis not present

## 2019-12-24 DIAGNOSIS — I110 Hypertensive heart disease with heart failure: Secondary | ICD-10-CM | POA: Diagnosis not present

## 2019-12-24 DIAGNOSIS — F17210 Nicotine dependence, cigarettes, uncomplicated: Secondary | ICD-10-CM | POA: Diagnosis not present

## 2019-12-24 DIAGNOSIS — I509 Heart failure, unspecified: Secondary | ICD-10-CM | POA: Diagnosis not present

## 2019-12-24 DIAGNOSIS — I83892 Varicose veins of left lower extremities with other complications: Secondary | ICD-10-CM | POA: Insufficient documentation

## 2019-12-24 DIAGNOSIS — Z79899 Other long term (current) drug therapy: Secondary | ICD-10-CM | POA: Insufficient documentation

## 2019-12-24 DIAGNOSIS — E119 Type 2 diabetes mellitus without complications: Secondary | ICD-10-CM | POA: Diagnosis not present

## 2019-12-24 MED ORDER — LIDOCAINE-EPINEPHRINE 1 %-1:100000 IJ SOLN
10.0000 mL | Freq: Once | INTRAMUSCULAR | Status: AC
  Administered 2019-12-24: 10 mL
  Filled 2019-12-24: qty 1

## 2019-12-24 NOTE — ED Triage Notes (Signed)
Pt reports a varicose vein on his left leg bursted last night, bleeding controlled with a gauze at the time but when he woke up this morning the bleeding had gone through the gauze.

## 2019-12-24 NOTE — Discharge Instructions (Signed)
Keep pressure to this area for 24 hours.  Return for new or worsening symptoms  Suture will need to be removed in 1 week.

## 2019-12-24 NOTE — ED Provider Notes (Signed)
[facility] EMERGENCY DEPARTMENT Provider Note   CSN: [id number] Arrival date & time: 12/24/19  2094    History Chief Complaint  Patient presents with  . Leg Injury   Kyle Peterson is a 54 y.o. male with past medical history significant for AICD presents, AAA, CAD, CHF, DM, HTN, CVA who presents for evaluation of bleeding leg. States last night scratched a variocele on his leg. Bleeding controlled by pressure however this morning noted bleeding began today. Chronic anticoagulation with Xarelto.  Denies recent falls.  He has no pain.  Last tetanus less than 5 years ago. Denies fever, chills, N/V, CP, SOB, unilateral swelling, redness, warmth.  No paresthesias. No lightheadedness, dizziness. He is ambulatory with out difficulty. Chronic back pain. Getting injection later today for chronic pain with NSGY per patient.  Denies additional aggravating or alleviating factors.  History from patient and past medical records.  No interpreter used.   HPI     Past Medical History:  Diagnosis Date  . AICD (automatic cardioverter/defibrillator) present    bostan scien  . Anginal pain (HCC)    ?heart pain   per dr Estill Dooms  . Anxiety   . Arthritis    ddd  . Ascending aortic aneurysm (HCC)    73mm by echo 2021  . CAD (coronary artery disease), native coronary artery 08/19/2015  . CHF (congestive heart failure) (HCC)   . Depression   . Diabetes mellitus without complication (HCC)    no meds  new dx  . Heart murmur   . High cholesterol   . Hypertension   . Myocardial infarction (HCC)   . OSA (obstructive sleep apnea)    Severe with AHI 80/hr- new bipap  . PAF (paroxysmal atrial fibrillation) (HCC) 08/19/2015  . PTSD (post-traumatic stress disorder)   . Stroke (HCC) 2016  . Substance abuse [facility])    Patient Active Problem List   Diagnosis Date Noted  . Mitral regurgitation 08/15/2019  . Long term (current) use of anticoagulants 08/15/2019  . Cardiac defibrillator in place  08/15/2019  . Unsteadiness on feet 10/25/2018  . Ascending aortic aneurysm (HCC)   . Chronic bilateral low back pain 12/13/2017  . Posterior tibial tendonitis, right 12/13/2017  . Class 3 severe obesity due to excess calories with serious comorbidity and body mass index (BMI) of 40.0 to 44.9 in adult (HCC) 12/13/2017  . Chest mass 05/30/2017  . S/P left rotator cuff repair 10/25/2016  . Severe obesity (BMI >= 40) (HCC) 09/27/2016  . Dilated aortic root (HCC) 07/20/2016  . Claudication (HCC) 07/20/2016  . Heart murmur 07/20/2016  . Trigger finger, left middle finger 06/21/2016  . Diabetes (HCC) 05/24/2016  . Depression, major, recurrent, moderate (HCC) 11/05/2015  . Chronic post-traumatic stress disorder (PTSD) 11/05/2015    Class: Chronic  . OSA (obstructive sleep apnea)   . Persistent atrial fibrillation (HCC) 08/19/2015  . CAD (coronary artery disease), native coronary artery 08/19/2015  . Shoulder pain 06/11/2015  . Hyperlipidemia 06/10/2015  . Tobacco use disorder 05/28/2015  . Essential hypertension 05/27/2015  . Bipolar disorder (HCC) 05/27/2015  . 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 Atrial flutter (HCC) 02/20/2014  . Fluid overload 02/05/2014  . Atelectasis 02/04/2014  . Schizoaffective disorder (HCC) 11/27/2013  . S/P ventricular septal myectomy 02/13/2013    Past Surgical History:  Procedure Laterality Date  . CARDIAC CATHETERIZATION     01/13/14 [facility]: LM NL, mild narrowing of proximal/mid LAD, mid LCX, proximal RCA. 30% ostial OM1. 40%  distal RCA. Medical tx.   . CARPAL TUNNEL RELEASE Left 11/02/2015  . CERVICAL DISCECTOMY    . MYOMECTOMY    . SHOULDER ARTHROSCOPY WITH ROTATOR CUFF REPAIR Left 09/09/2016   Procedure: LEFT SHOULDER ARTHROSCOPY WITH MINI OPEN ROTATOR CUFF REPAIR;  Surgeon: Eldred MangesYates, Mark C, MD;  Location: [facility] OR;  Service: Orthopedics;  Laterality: Left;  . TONSILLECTOMY         Family History  Problem Relation Age of Onset    . Diabetes Mother   . Hypertension Mother   . Diabetes Father   . Hypertension Father   . Heart disease Father   . Alcohol abuse Father   . Alcohol abuse Brother   . Drug abuse Brother     Social History   Tobacco Use  . Smoking status: Current Every Day Smoker    Packs/day: 0.50    Years: 40.00    Pack years: 20.00    Types: Cigarettes  . Smokeless tobacco: Never Used  . Tobacco comment: Trying to cut back   Substance Use Topics  . Alcohol use: No    Alcohol/week: 0.0 standard drinks  . Drug use: Yes    Frequency: 3.0 times per week    Types: Marijuana    Comment: last 07/26/16    Home Medications Prior to Admission medications   Medication Sig Start Date End Date Taking? Authorizing Provider  albuterol (VENTOLIN HFA) 108 (90 Base) MCG/ACT inhaler INHALE 1 TO 2 PUFFS BY MOUTH EVERY 6 HOURS AS NEEDED 05/06/19   [provider]  atorvastatin (LIPITOR) 40 MG tablet Take 1 tablet (40 mg total) by mouth daily. 01/11/19   Quintella Reicherturner, Traci R, MD  buPROPion (WELLBUTRIN SR) 150 MG 12 hr tablet Take 150 mg by mouth daily.  05/14/19   [provider]  diltiazem (CARDIZEM CD) 240 MG 24 hr capsule Take 1 capsule (240 mg total) by mouth daily. 01/11/19   Quintella Reicherturner, Traci R, MD  furosemide (LASIX) 20 MG tablet Take 1 tablet (20 mg total) by mouth daily. 08/15/19   Quintella Reicherturner, Traci R, MD  gabapentin (NEURONTIN) 300 MG capsule Take 300 mg by mouth daily.  05/06/19   [provider]  HYDROcodone-acetaminophen (NORCO/VICODIN) 5-325 MG tablet Take 1 tablet by mouth every 6 (six) hours as needed for moderate pain.    [provider]  losartan (COZAAR) 100 MG tablet Take 1 tablet by mouth once daily 08/13/19   Quintella Reicherturner, Traci R, MD  metoprolol succinate (TOPROL-XL) 50 MG 24 hr tablet TAKE 1 TABLET BY MOUTH ONCE DAILY WITH A MEAL OR  IMMEDIATELY  FOLLOWING  A  MEAL 11/06/19   Turner, Cornelious Bryantraci R, MD  montelukast (SINGULAIR) 10 MG tablet SMARTSIG:1 Tablet(s) By Mouth Every Evening  05/27/19   [provider]  orphenadrine (NORFLEX) 100 MG tablet Take 100 mg by mouth as needed for muscle spasms.    [provider]  XARELTO 20 MG TABS tablet Take 1 tablet by mouth once daily 09/11/19   Quintella Reicherturner, Traci R, MD    Allergies    Ace inhibitors  Review of Systems   Review of Systems  Constitutional: Negative.   HENT: Negative.   Respiratory: Negative.   Cardiovascular: Negative.   Gastrointestinal: Negative.   Genitourinary: Negative.   Musculoskeletal: Negative.   Skin: Positive for wound.  Neurological: Negative.   All other systems reviewed and are negative.   Physical Exam Updated Vital Signs BP (!) 157/84   Pulse 64   Temp 98 F (  36.7 C)   Resp (!) 22   Ht 5\' 8"  (1.727 m)   Wt 129.7 kg   SpO2 98%   BMI 43.49 kg/m   Physical Exam Vitals and nursing note reviewed.  Constitutional:      General: He is not in acute distress.    Appearance: He is well-developed. He is not ill-appearing, toxic-appearing or diaphoretic.  HENT:     Head: Normocephalic and atraumatic.     Nose: Nose normal.     Mouth/Throat:     Mouth: Mucous membranes are moist.  Eyes:     Pupils: Pupils are equal, round, and reactive to light.  Cardiovascular:     Rate and Rhythm: Normal rate and regular rhythm.     Pulses: Normal pulses.     Heart sounds: Normal heart sounds.  Pulmonary:     Effort: Pulmonary effort is normal. No respiratory distress.     Breath sounds: Normal breath sounds.  Abdominal:     General: Bowel sounds are normal. There is no distension.     Palpations: Abdomen is soft.  Musculoskeletal:        General: Signs of injury present. No swelling, tenderness or deformity. Normal range of motion.     Cervical back: Normal range of motion and neck supple.     Right lower leg: No edema.     Left lower leg: No edema.  Skin:    General: Skin is warm and dry.     Capillary Refill: Capillary refill takes less than 2 seconds.     Comments:  Bleeding to anterior aspect left leg on just medial to tib, fib. No pulsatile bleeding. Excoriations to left lower leg.  Neurological:     General: No focal deficit present.     Mental Status: He is alert and oriented to person, place, and time.     Comments: Intact sensation. Ambulatory without difficulty    ED Results / Procedures / Treatments   Labs (all labs ordered are listed, but only abnormal results are displayed) Labs Reviewed - No data to display  EKG None  Radiology No results found.  Procedures . Laceration Repair  Date/Time: 12/24/2019 9:27 AM Performed by: 12/26/2019, PA-C Authorized by: Linwood Dibbles, PA-C   Consent:    Consent obtained:  Verbal   Consent given by:  Patient   Risks discussed:  Infection, need for additional repair, pain, poor cosmetic result and poor wound healing   Alternatives discussed:  No treatment and delayed treatment Universal protocol:    Procedure explained and questions answered to patient or proxy's satisfaction: yes     Relevant documents present and verified: yes     Test results available and properly labeled: yes     Imaging studies available: yes     Required blood products, implants, devices, and special equipment available: yes     Site/side marked: yes     Immediately prior to procedure, a time out was called: yes     Patient identity confirmed:  Verbally with patient Anesthesia (see MAR for exact dosages):    Anesthesia method:  Local infiltration   Local anesthetic:  Lidocaine 1% WITH epi Laceration details:    Location:  Leg   Leg location:  L lower leg   Length (cm):  0.2 Repair type:    Repair type:  Intermediate Pre-procedure details:    Preparation:  Patient was prepped and draped in usual sterile fashion and imaging obtained to evaluate for foreign  bodies Exploration:    Hemostasis achieved with:  Direct pressure   Wound exploration: wound explored through full range of motion and entire depth  of wound probed and visualized     Contaminated: no   Treatment:    Area cleansed with:  Betadine   Amount of cleaning:  Extensive   Irrigation solution:  Sterile saline   Irrigation method:  Pressure wash Skin repair:    Repair method:  Sutures   Suture size:  4-0   Suture material:  Prolene   Suture technique:  Figure eight   Number of sutures:  1 Approximation:    Approximation:  Close Post-procedure details:    Dressing:  Bulky dressing   Patient tolerance of procedure:  Tolerated well, no immediate complications   (including critical care time)  Medications Ordered in ED Medications  lidocaine-EPINEPHrine (XYLOCAINE W/EPI) 1 %-1:100000 (with pres) injection 10 mL (10 mLs Infiltration Given 12/24/19 5809)    ED Course  I have reviewed the triage vital signs and the nursing notes.  Pertinent labs & imaging results that were available during my care of the patient were reviewed by me and considered in my medical decision making (see chart for details).  54 year old presents for evaluation of varicose vein bleeding to left lower leg. Hx of same. Began yesterday evening however stopped with pressure.  Began again this morning.  Patient without any lightheadedness or dizziness.  Chronically anticoagulated Xarelto.  Denies any complaints.  Patient with slow oozing to medial aspect left lower shin. Figure-of-eight suture placed with cessation of bleeding. Tetanus up to date.  Monitored in ED without reoccuring bleed. Dressing placed. Ambulatory in ED without difficulty. Dc home in stable condition.  The patient has been appropriately medically screened and/or stabilized in the ED. I have low suspicion for any other emergent medical condition which would require further screening, evaluation or treatment in the ED or require inpatient management.  Patient is hemodynamically stable and in no acute distress.  Patient able to ambulate in department prior to ED.  Evaluation does not show  acute pathology that would require ongoing or additional emergent interventions while in the emergency department or further inpatient treatment.  I have discussed the diagnosis with the patient and answered all questions.  Pain is been managed while in the emergency department and patient has no further complaints prior to discharge.  Patient is comfortable with plan discussed in room and is stable for discharge at this time.  I have discussed strict return precautions for returning to the emergency department.  Patient was encouraged to follow-up with PCP/specialist refer to at discharge.    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

## 2020-02-12 ENCOUNTER — Other Ambulatory Visit: Payer: Self-pay

## 2020-02-12 ENCOUNTER — Other Ambulatory Visit: Payer: Self-pay | Admitting: Cardiology

## 2020-02-12 ENCOUNTER — Ambulatory Visit (INDEPENDENT_AMBULATORY_CARE_PROVIDER_SITE_OTHER): Payer: Medicare Other

## 2020-02-12 ENCOUNTER — Ambulatory Visit (INDEPENDENT_AMBULATORY_CARE_PROVIDER_SITE_OTHER): Payer: Medicare Other | Admitting: Cardiology

## 2020-02-12 ENCOUNTER — Encounter: Payer: Self-pay | Admitting: Cardiology

## 2020-02-12 VITALS — BP 142/88 | HR 66 | Ht 68.0 in | Wt 284.0 lb

## 2020-02-12 DIAGNOSIS — I4819 Other persistent atrial fibrillation: Secondary | ICD-10-CM | POA: Diagnosis not present

## 2020-02-12 DIAGNOSIS — I421 Obstructive hypertrophic cardiomyopathy: Secondary | ICD-10-CM

## 2020-02-12 DIAGNOSIS — I77810 Thoracic aortic ectasia: Secondary | ICD-10-CM

## 2020-02-12 DIAGNOSIS — I480 Paroxysmal atrial fibrillation: Secondary | ICD-10-CM

## 2020-02-12 DIAGNOSIS — I2510 Atherosclerotic heart disease of native coronary artery without angina pectoris: Secondary | ICD-10-CM

## 2020-02-12 DIAGNOSIS — I422 Other hypertrophic cardiomyopathy: Secondary | ICD-10-CM | POA: Diagnosis not present

## 2020-02-12 DIAGNOSIS — R002 Palpitations: Secondary | ICD-10-CM | POA: Diagnosis not present

## 2020-02-12 DIAGNOSIS — H539 Unspecified visual disturbance: Secondary | ICD-10-CM | POA: Diagnosis not present

## 2020-02-12 DIAGNOSIS — E7800 Pure hypercholesterolemia, unspecified: Secondary | ICD-10-CM

## 2020-02-12 DIAGNOSIS — I10 Essential (primary) hypertension: Secondary | ICD-10-CM

## 2020-02-12 DIAGNOSIS — G4733 Obstructive sleep apnea (adult) (pediatric): Secondary | ICD-10-CM

## 2020-02-12 DIAGNOSIS — G935 Compression of brain: Secondary | ICD-10-CM

## 2020-02-12 DIAGNOSIS — I5032 Chronic diastolic (congestive) heart failure: Secondary | ICD-10-CM

## 2020-02-12 NOTE — Addendum Note (Signed)
Addended by: Theresia Majors on: 02/12/2020 02:25 PM   Modules accepted: Orders

## 2020-02-12 NOTE — Addendum Note (Signed)
Addended by: Theresia Majors on: 02/12/2020 02:30 PM   Modules accepted: Orders

## 2020-02-12 NOTE — Progress Notes (Signed)
Date:  02/12/2020   ID:  Kyle Peterson, DOB 05-03-1965, MRN [id number]  PCP:  Patient, No Pcp Per  Cardiologist:  Armanda Magic, MD Sleep Medicine:  Armanda Magic, MD Electrophysiologist:  Lorenso Courier, MD  Chief Complaint:  OSA  History of Present Illness:    Kyle Peterson is a 55 y.o. male with a hx of severe OSA with an AHI of 80.8/hr and underwent CPAP titration but due to ongoing events could not be titrated on CPAP and ultimately was placed on BiPAP at 20/16cm H2O. He also has a history ofnonobstructiveCAD,PAF,HTN, HOCM s/p septal myectomy s/p AICD, hyperlipidemia, polysubstance abuse with heroin/cocaine and ETOH for 25 years but has been clean for over 10 years.  When I last saw him he was complaining of chest pain and a nuclear stress test was done and showed no ischemia.    He is here today for followup and is doing well.  He tells me that in December he moved apartments over 3 days by himself.  During the time he had 3 episodes of feeling cold and clammy, sweating and weak and would have to sit down and it would subside after 1 minute.  He then had a 4th episode with his extremities shaking and felt like he couldn't think right but had no syncope and only lasted a minute.  The first 3 were while he was up moving things and the 4th episode occurred while sitting.  He does not think that he had been drinking enough fluids and thought he was likely dehydrated.  He did not have any chest pain or SOB and he has not had any exertional CP or DOE since then.  He denies any  PND, orthopnea, LE edema or syncope. Occasionally he will notice a flip flop in his heart.  He is compliant with his meds and is tolerating meds with no SE.    He is doing well with his CPAP device and thinks that he has gotten used to it.  He tolerates the mask and feels the pressure is adequate.  Since going on CPAP he feels rested in the am and has no significant daytime sleepiness.  He denies any significant mouth or nasal  dryness or nasal congestion.  He does not think that he snores.    Prior CV studies:   The following studies were reviewed today:  PAP compliance download  2D echo 10/2019 IMPRESSIONS  1. Left ventricular ejection fraction, by estimation, is 60 to 65%. The  left ventricle has normal function. The left ventricle has no regional  wall motion abnormalities. There is moderate asymmetric left ventricular  hypertrophy. Left ventricular  diastolic parameters are consistent with Grade I diastolic dysfunction  (impaired relaxation).  2. Right ventricular systolic function is normal. The right ventricular  size is normal. There is mildly elevated pulmonary artery systolic  pressure.  3. The mitral valve is grossly normal. Mild mitral valve regurgitation.  4. The aortic valve is normal in structure. Aortic valve regurgitation is  trivial.  5. Aortic dilatation noted. There is mild dilatation of the ascending  aorta, measuring 38 mm.   Past Medical History:  Diagnosis Date  . AICD (automatic cardioverter/defibrillator) present    bostan scien  . Anginal pain (HCC)    ?heart pain   per dr Estill Dooms  . Anxiety   . Arthritis    ddd  . Ascending aortic aneurysm (HCC)    43mm by echo 2021  . CAD (coronary artery disease), native coronary  artery 08/19/2015  . CHF (congestive heart failure) (HCC)   . Depression   . Diabetes mellitus without complication (HCC)    no meds  new dx  . Heart murmur   . High cholesterol   . Hypertension   . Myocardial infarction (HCC)   . OSA (obstructive sleep apnea)    Severe with AHI 80/hr- new bipap  . PAF (paroxysmal atrial fibrillation) (HCC) 08/19/2015  . PTSD (post-traumatic stress disorder)   . Stroke (HCC) 2016  . Substance abuse [facility])    Past Surgical History:  Procedure Laterality Date  . CARDIAC CATHETERIZATION     01/13/14 [facility]: LM NL, mild narrowing of proximal/mid LAD, mid LCX, proximal RCA. 30% ostial OM1. 40% distal RCA.  Medical tx.   . CARPAL TUNNEL RELEASE Left 11/02/2015  . CERVICAL DISCECTOMY    . MYOMECTOMY    . SHOULDER ARTHROSCOPY WITH ROTATOR CUFF REPAIR Left 09/09/2016   Procedure: LEFT SHOULDER ARTHROSCOPY WITH MINI OPEN ROTATOR CUFF REPAIR;  Surgeon: Eldred Manges, MD;  Location: [facility] OR;  Service: Orthopedics;  Laterality: Left;  . TONSILLECTOMY       Current Meds  Medication Sig  . albuterol (VENTOLIN HFA) 108 (90 Base) MCG/ACT inhaler INHALE 1 TO 2 PUFFS BY MOUTH EVERY 6 HOURS AS NEEDED  . atorvastatin (LIPITOR) 40 MG tablet Take 1 tablet (40 mg total) by mouth daily.  Marland Kitchen diltiazem (CARDIZEM CD) 240 MG 24 hr capsule Take 1 capsule (240 mg total) by mouth daily.  . furosemide (LASIX) 20 MG tablet Take 1 tablet (20 mg total) by mouth daily.  Marland Kitchen gabapentin (NEURONTIN) 300 MG capsule Take 300 mg by mouth daily.   Marland Kitchen losartan (COZAAR) 100 MG tablet Take 1 tablet by mouth once daily  . metFORMIN (GLUCOPHAGE) 500 MG tablet Take 500 mg by mouth 2 (two) times daily with a meal.  . metoprolol succinate (TOPROL-XL) 50 MG 24 hr tablet TAKE 1 TABLET BY MOUTH ONCE DAILY WITH A MEAL OR  IMMEDIATELY  FOLLOWING  A  MEAL  . orphenadrine (NORFLEX) 100 MG tablet Take 100 mg by mouth as needed for muscle spasms.  Carlena Hurl 20 MG TABS tablet Take 1 tablet by mouth once daily     Allergies:   Ace inhibitors   Social History   Tobacco Use  . Smoking status: Current Every Day Smoker    Packs/day: 0.50    Years: 40.00    Pack years: 20.00    Types: Cigarettes  . Smokeless tobacco: Never Used  . Tobacco comment: Trying to cut back   Substance Use Topics  . Alcohol use: No    Alcohol/week: 0.0 standard drinks  . Drug use: Yes    Frequency: 3.0 times per week    Types: Marijuana    Comment: last 07/26/16     Family Hx: The patient's family history includes Alcohol abuse in his brother and father; Diabetes in his father and mother; Drug abuse in his brother; Heart disease in his father; Hypertension in his  father and mother.  ROS:   Please see the history of present illness.     All other systems reviewed and are negative.   Labs/Other Tests and Data Reviewed:    Recent Labs: 08/12/2019: BUN 15; Creatinine, Ser 1.14; Hemoglobin 16.0; Platelets 189; Potassium 4.4; Sodium 139   Recent Lipid Panel Lab Results  Component Value Date/Time   CHOL 125 08/21/2018 08:41 AM   TRIG 87 08/21/2018 08:41 AM   HDL 42  08/21/2018 08:41 AM   CHOLHDL 3.0 08/21/2018 08:41 AM   CHOLHDL 5.6 (H) 06/10/2015 09:24 AM   LDLCALC 66 08/21/2018 08:41 AM    Wt Readings from Last 3 Encounters:  02/12/20 284 lb (128.8 kg)  12/24/19 286 lb (129.7 kg)  10/23/19 290 lb 9.6 oz (131.8 kg)     Objective:    Vital Signs:  BP (!) 142/88   Pulse 66   Ht 5\' 8"  (1.727 m)   Wt 284 lb (128.8 kg)   SpO2 97%   BMI 43.18 kg/m    GEN: Well nourished, well developed in no acute distress HEENT: Normal NECK: No JVD; No carotid bruits LYMPHATICS: No lymphadenopathy CARDIAC:RRR, no murmurs, rubs, gallops RESPIRATORY:  Clear to auscultation without rales, wheezing or rhonchi  ABDOMEN: Soft, non-tender, non-distended MUSCULOSKELETAL:  No edema; No deformity  SKIN: Warm and dry NEUROLOGIC:  Alert and oriented x 3 PSYCHIATRIC:  Normal affect    ASSESSMENT & PLAN:    1. ASCAD -cath 2015 at [facility] showed LM NL, mild narrowing of proximal/mid LAD, mid LCX, proximal RCA. 30% ostial OM1. 40% distal RCA -Lexiscan myoview in 07/2019 showed no ischemia>>done for atypical pin prick chest pain -he has not had any anginal CP -continue Toprol XL 100mg  daily and statin -no ASA due to DOAC -he continues to cut back on smoking    2. Persistent atrial Fibrillation -he is maintaining NSR and denies any palpitations -no bleeding problems on DOAC -Continue Toprol XL 100mg  daily, Cardizem CD 240mg  daily and Xarelto 20mg  daily -SCR was 1.14 and Hbg 16 in July 2021  3. HOCM -s/p septal myomectomy. -2D  echo9/2021 showed normal LVF mild MR  -he has not had any syncope  -s/p AICD - followed in device clinic  4. HTN -BP controlled on exam today -continue Toprol XL 100mg  daily, Cardizem CD 240mg  daily and Losartan 100mg  daily -I have asked him to check his BP twice daily for a week and call with results  5. Dilated ascending aorta - Ascending aorta dilated on echo at 78mm 10/2019  -repeat echo in 1 year to make sure this remains stable  6.OSA -   The patient is tolerating PAP therapy well without any problems. The PAP download was reviewed today and showed an AHI of 0.7/hr on auto BiPAP cm H2O with 83% compliance in using more than 4 hours nightly.  The patient has been using and benefiting from PAP use and will continue to benefit from therapy.   7. Hyperlipidemia -LDL goal is <70.  -LDL 66 in July 2020 -continue Atorvastatin 40mg  daily -check FLP and ALT  8.  Morbid obesity  - he is limited with exercise due to back problems  9.  Chronic diastolic CHF -he does not appear volume overloaded on exam today and weight is stable -he has not had any SOB or LE edema -continue lasix 20mg  daily -SCr was stable at 1.14 and K+ 4.4  10.  Visual disturbances -He continues to have episodes of what he calls a "kaleidescope" vision.  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   -I will get a heart monitor to rule out arrhythmia>>will get a 2 week Ziopatch to rule out arrhythmia -Will refer to Neuro for further evaulation  Medication Adjustments/Labs and Tests Ordered: Current medicines are reviewed at length with the patient today.  Concerns regarding medicines are outlined above.  Tests Ordered:  No orders of the defined types were placed in this encounter.  Medication Changes: No orders of the defined types were placed in this encounter.   Disposition:  Follow up in 6 months  Signed, Armanda Magicraci Delmos Velaquez, MD   02/12/2020 2:16 PM    [facility]

## 2020-02-12 NOTE — Patient Instructions (Signed)
Medication Instructions:  Your physician recommends that you continue on your current medications as directed. Please refer to the Current Medication list given to you today.  *If you need a refill on your cardiac medications before your next appointment, please call your pharmacy*   Lab Work: Fasting lipids and ALT  If you have labs (blood work) drawn today and your tests are completely normal, you will receive your results only by: Marland Kitchen MyChart Message (if you have MyChart) OR . A paper copy in the mail If you have any lab test that is abnormal or we need to change your treatment, we will call you to review the results.  Follow-Up: At [facility], you and your health needs are our priority.  As part of our continuing mission to provide you with exceptional heart care, we have created designated Provider Care Teams.  These Care Teams include your primary Cardiologist (physician) and Advanced Practice Providers (APPs -  Physician Assistants and Nurse Practitioners) who all work together to provide you with the care you need, when you need it.   Your next appointment:   1 year(s)  The format for your next appointment:   In Person  Provider:   You may see Armanda Magic, MD or one of the following Advanced Practice Providers on your designated Care Team:    Ronie Spies, PA-C  Jacolyn Reedy, PA-C Other Instructions:  You have been referred to a neurologist

## 2020-02-12 NOTE — Addendum Note (Signed)
Addended by: Theresia Majors on: 02/12/2020 02:27 PM   Modules accepted: Orders

## 2020-02-25 LAB — LIPID PANEL
Chol/HDL Ratio: 2.8 ratio (ref 0.0–5.0)
Cholesterol, Total: 126 mg/dL (ref 100–199)
HDL: 45 mg/dL (ref >39)
LDL Chol Calc (NIH): 66 mg/dL (ref 0–99)
Triglycerides: 77 mg/dL (ref 0–149)
VLDL Cholesterol Cal: 15 mg/dL (ref 5–40)

## 2020-02-25 LAB — ALT: ALT: 22 IU/L (ref 0–44)

## 2020-03-03 ENCOUNTER — Other Ambulatory Visit: Payer: Self-pay

## 2020-03-03 ENCOUNTER — Encounter: Payer: Self-pay | Admitting: Neurology

## 2020-03-03 ENCOUNTER — Ambulatory Visit (INDEPENDENT_AMBULATORY_CARE_PROVIDER_SITE_OTHER): Payer: Medicare Other | Admitting: Neurology

## 2020-03-03 DIAGNOSIS — H5310 Unspecified subjective visual disturbances: Secondary | ICD-10-CM

## 2020-03-03 HISTORY — DX: Unspecified subjective visual disturbances: H53.10

## 2020-03-03 NOTE — Progress Notes (Signed)
Reason for visit: Subjective visual disturbance  Referring physician: Dr. Staci Righter is a 55 y.o. male  History of present illness:  Kyle Peterson is a 55 year old right-handed white male with a history of obesity, sleep apnea on BiPAP, coronary artery disease, atrial fibrillation, hypertension, and diabetes.  The patient also has a history of ongoing tobacco abuse.  He presents with a 2-year history of intermittent visual episodes that are unassociated with headache.  He reports a kaleidoscope event that occurs lasting 5 to 10 minutes and then clears.  The episodes may impair his central vision, oftentimes occurring when he is using a computer but sometimes while driving.  He reports on occasion that he has had episodes of bilateral thigh numbness and back pain with the above event.  Within the last 2 years he has also had episodes of vertigo and underwent MRI of the brain in October 2020.  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  The patient is being followed by Dr. Conchita Paris from neurosurgery for his back issues, he has possible impingement of the right L5 and right S1 nerve roots and is getting injections in the back.  The patient reports no numbness or weakness of the face, arms, legs, he denies issues with balance or difficulty controlling the bowels or the bladder with exception that he does have some difficulty fully voiding the bladder at times.  The vertigo is improved, but not completely gone.  Given the episodes of the visual disturbances, he comes to this office for further evaluation.  He reports that his mother has a history of migraine headache.  He has 1 brother and 1 sister but he does not know whether or not they have headache.  Past Medical History:  Diagnosis Date  . AICD (automatic cardioverter/defibrillator) present     bostan scien  . Anginal pain (HCC)    ?heart pain   per dr Estill Dooms  . Anxiety   . Arthritis    ddd  . Ascending aortic aneurysm (HCC)    59mm by echo 2021  . CAD (coronary artery disease), native coronary artery 08/19/2015  . CHF (congestive heart failure) (HCC)   . Depression   . Diabetes mellitus without complication (HCC)    no meds  new dx  . Heart murmur   . High cholesterol   . Hypertension   . Myocardial infarction (HCC)   . OSA (obstructive sleep apnea)    Severe with AHI 80/hr- new bipap  . PAF (paroxysmal atrial fibrillation) (HCC) 08/19/2015  . PTSD (post-traumatic stress disorder)   . Stroke (HCC) 2016  . Substance abuse [facility])     Past Surgical History:  Procedure Laterality Date  . CARDIAC CATHETERIZATION     01/13/14 [facility]: LM NL, mild narrowing of proximal/mid LAD, mid LCX, proximal RCA. 30% ostial OM1. 40% distal RCA. Medical tx.   . CARPAL TUNNEL RELEASE Left 11/02/2015  . CERVICAL DISCECTOMY    . MYOMECTOMY    . SHOULDER ARTHROSCOPY WITH ROTATOR CUFF REPAIR Left 09/09/2016   Procedure: LEFT SHOULDER ARTHROSCOPY WITH MINI OPEN ROTATOR CUFF REPAIR;  Surgeon: Eldred Manges, MD;  Location: [facility] OR;  Service: Orthopedics;  Laterality: Left;  . TONSILLECTOMY      Family History  Problem Relation Age of Onset  . Diabetes Mother   . Hypertension Mother   .  Diabetes Father   . Hypertension Father   . Heart disease Father   . Alcohol abuse Father   . Alcohol abuse Brother   . Drug abuse Brother     Social history:  reports that he has been smoking cigarettes. He has a 20.00 pack-year smoking history. He has never used smokeless tobacco. He reports current drug use. Frequency: 3.00 times per week. Drug: Marijuana. He reports that he does not drink alcohol.  Medications:  Prior to Admission medications   Medication Sig Start Date End Date Taking? Authorizing Provider  albuterol (VENTOLIN HFA) 108 (90 Base) MCG/ACT inhaler INHALE 1 TO 2 PUFFS BY MOUTH  EVERY 6 HOURS AS NEEDED 05/06/19  Yes [provider]  atorvastatin (LIPITOR) 40 MG tablet Take 1 tablet (40 mg total) by mouth daily. 01/11/19  Yes Turner, Cornelious Bryant, MD  diltiazem (CARDIZEM CD) 240 MG 24 hr capsule Take 1 capsule (240 mg total) by mouth daily. 01/11/19  Yes Turner, Cornelious Bryant, MD  furosemide (LASIX) 20 MG tablet Take 1 tablet (20 mg total) by mouth daily. 08/15/19  Yes Turner, Cornelious Bryant, MD  gabapentin (NEURONTIN) 300 MG capsule Take 300 mg by mouth daily.  05/06/19  Yes [provider]  losartan (COZAAR) 100 MG tablet Take 1 tablet by mouth once daily 08/13/19  Yes Turner, Cornelious Bryant, MD  metFORMIN (GLUCOPHAGE) 500 MG tablet Take 500 mg by mouth 2 (two) times daily with a meal.   Yes [provider]  metoprolol succinate (TOPROL-XL) 50 MG 24 hr tablet TAKE 1 TABLET BY MOUTH ONCE DAILY WITH A MEAL OR  IMMEDIATELY  FOLLOWING  A  MEAL 11/06/19  Yes Turner, Cornelious Bryant, MD  montelukast (SINGULAIR) 10 MG tablet  05/27/19  Yes [provider]  orphenadrine (NORFLEX) 100 MG tablet Take 100 mg by mouth as needed for muscle spasms.   Yes [provider]  XARELTO 20 MG TABS tablet Take 1 tablet by mouth once daily 09/11/19  Yes Turner, Traci R, MD  buPROPion (WELLBUTRIN SR) 150 MG 12 hr tablet Take 150 mg by mouth daily. 05/14/19   [provider]  HYDROcodone-acetaminophen (NORCO/VICODIN) 5-325 MG tablet Take 1 tablet by mouth every 6 (six) hours as needed for moderate pain.    [provider]      Allergies  Allergen Reactions  . Ace Inhibitors Shortness Of Breath and Cough    ROS:  Out of a complete 14 system review of symptoms, the patient complains only of the following symptoms, and all other reviewed systems are negative.  Visual disturbance Back pain Decreased memory  Blood pressure (!) 194/100, pulse 92, height 5\' 8"  (1.727 m), weight 289 lb (131.1 kg).  Physical Exam  General: The patient is alert and cooperative at the time  of the examination.  The patient is markedly obese.  Eyes: Pupils are equal, round, and reactive to light. Discs are flat bilaterally.  Neck: The neck is supple, no carotid bruits are noted.  Respiratory: The respiratory examination is clear.  Cardiovascular: The cardiovascular examination reveals a regular rate and rhythm, no obvious murmurs or rubs are noted.  Skin: Extremities are without significant edema.  Neurologic Exam  Mental status: The patient is alert and oriented x 3 at the time of the examination. The patient has apparent normal recent and remote memory, with an apparently normal attention span and concentration ability.  Cranial nerves: Facial symmetry is present. There is good sensation of the face to pinprick and  soft touch bilaterally. The strength of the facial muscles and the muscles to head turning and shoulder shrug are normal bilaterally. Speech is well enunciated, no aphasia or dysarthria is noted. Extraocular movements are full. Visual fields are full. The tongue is midline, and the patient has symmetric elevation of the soft palate. No obvious hearing deficits are noted.  Motor: The motor testing reveals 5 over 5 strength of all 4 extremities. Good symmetric motor tone is noted throughout.  Sensory: Sensory testing is intact to pinprick, soft touch, vibration sensation, and position sense on all 4 extremities. No evidence of extinction is noted.  Coordination: Cerebellar testing reveals good finger-nose-finger and heel-to-shin bilaterally.  Gait and station: Gait is normal. Tandem gait is normal. Romberg is negative. No drift is seen.  Reflexes: Deep tendon reflexes are symmetric and normal bilaterally. Toes are downgoing bilaterally.   MRI brain 11/08/18:  IMPRESSION: 1. No evidence of acute intracranial abnormality. 2. The right cerebellar tonsil extends below the level of foramen magnum by 6 mm. Although this technically meets measurement criteria for a  Chiari I malformation, the right cerebellar tonsil maintains a rounded configuration and there is only mild crowding at the level of the foramen magnum. 3. Trace fluid within left mastoid air cells.  * MRI scan images were reviewed online. I agree with the written report.    Assessment/Plan:  1.  Subjective visual disturbance, probable migraine equivalent  The patient reports angular distortions of vision and reports that the vision appears to be as if he is looking in a kaleidoscope.  The vision disturbance appears to distort visions from both homonymous visual fields unassociated with other symptoms usually.  This likely represents a migrainous event.  If the events become extremely frequent, preventive therapy may be required.  Currently, the patient may have 2 or 3 episodes a month.  He will follow-up here on as-needed basis.  Kyle Palau MD 03/03/2020 8:21 AM  [facility] [address] [place], [place] [address]  Phone [phone] Fax [phone]

## 2020-03-10 ENCOUNTER — Encounter: Payer: Self-pay | Admitting: Cardiology

## 2020-03-10 DIAGNOSIS — I471 Supraventricular tachycardia, unspecified: Secondary | ICD-10-CM | POA: Insufficient documentation

## 2020-03-11 ENCOUNTER — Telehealth: Payer: Self-pay

## 2020-03-11 DIAGNOSIS — G4733 Obstructive sleep apnea (adult) (pediatric): Secondary | ICD-10-CM

## 2020-03-11 NOTE — Telephone Encounter (Signed)
-----   Message from Quintella Reichert, MD sent at 03/10/2020 10:15 PM EST ----- Patient has a few short episodes of fast heart beat from the top of the heart which is benign and likely not causing his visual episodes.  Please find out if he has had any palpitations

## 2020-03-11 NOTE — Telephone Encounter (Signed)
Increase cardizem CD to 300mg  daily for palpitations.  Please have get a PAP download for me to review.  Agree that he definitely needs a new PCP to address his needs - can you give him a list of available PCPs accepting new patients

## 2020-03-11 NOTE — Telephone Encounter (Signed)
The patient has been notified of the result and verbalized understanding.  All questions (if any) were answered. Patient states that he continues to have palpitations. He states they occur about 1-2 times per week and describes them as a "floppy fish" feeling in his chest. He also describes a slight pain/twinge in his chest along with palpitations.  Patient also states that he continues to be very fatigued even after getting 6-7 hours of sleep during the night. He states that his CPAP machine shows him a red frowny face every morning when he gets up.  Patient also voiced concern in care from his previous PCP and neurologist and states that he does not feel like they are listening to him. He states that he is very stressed and anxious and does not like to leave his house. He voiced multiple concerns to me. I encouraged him to find a new PCP as well and a mental health provider to address these concerns. Patient verbalized understanding.

## 2020-03-12 MED ORDER — DILTIAZEM HCL ER BEADS 300 MG PO CP24: 300.0000 mg | ORAL_CAPSULE | Freq: Every day | ORAL | 3 refills | Status: DC

## 2020-03-12 NOTE — Telephone Encounter (Signed)
Spoke with the patient and he will increase diltiazem to 300 mg.  Patient also has concerns about his mask for his CPAP and his machine. He states that he thinks that he needs a different mask and maybe a new machine.

## 2020-03-13 NOTE — Telephone Encounter (Signed)
He says he doesn't need another machine he just thought he might be eligible for a new one but he is not until 2023. He also is not eligible for a new mask until the end of March.

## 2020-03-13 NOTE — Telephone Encounter (Signed)
Patient's AHI is good on current settings.  Please find out why he thinks he needs a new machine.  I am fine with ordering a new PAP mask

## 2020-03-13 NOTE — Telephone Encounter (Addendum)
Patient complains of daytime sleepiness, patient is not resting like he should, takes a nap during the day for 20 minutes.  Shannan, Garfinkel 02/12/2020 - 03/12/2020 Patient ID: [id number] DOB: Oct 19, 1965 Age: 55 years 30.5 [place] (Therapist) [address] [place], [address] Compliance Report Usage 02/12/2020 - 03/12/2020 Usage days 30/30 days (100%) >= 4 hours 26 days (87%) < 4 hours 4 days (13%) Usage hours 180 hours 31 minutes Average usage (total days) 6 hours 1 minutes Average usage (days used) 6 hours 1 minutes Median usage (days used) 6 hours 26 minutes Total used hours (value since last reset - 03/12/2020) 7,536 hours AirCurve 10 VAuto Serial number [id number] Mode VAuto Max IPAP 20 cmH2O Min EPAP 16 cmH2O Pressure Support 4 cmH2O Therapy Leaks - L/min Median: 56.1 95th percentile: 88.3 Maximum: 101.5 Events per hour AI: 1.0 HI: 0.4 AHI: 1.4 Apnea Index Central: 0.0 Obstructive: 0.2 Unknown: 0.8 Usage - hours Printed on

## 2020-03-17 ENCOUNTER — Other Ambulatory Visit: Payer: Self-pay

## 2020-03-17 ENCOUNTER — Ambulatory Visit (INDEPENDENT_AMBULATORY_CARE_PROVIDER_SITE_OTHER): Payer: Medicare Other

## 2020-03-17 ENCOUNTER — Telehealth: Payer: Self-pay | Admitting: Cardiology

## 2020-03-17 DIAGNOSIS — I422 Other hypertrophic cardiomyopathy: Secondary | ICD-10-CM | POA: Diagnosis not present

## 2020-03-17 LAB — CUP PACEART REMOTE DEVICE CHECK
Battery Remaining Longevity: 144 mo
Battery Remaining Percentage: 100 %
Brady Statistic RV Percent Paced: 0 %
Date Time Interrogation Session: 20220215023100
HighPow Impedance: 63 Ohm
Implantable Lead Implant Date: 20160218
Implantable Lead Location: 753860
Implantable Lead Model: 292
Implantable Lead Serial Number: 358834
Implantable Pulse Generator Implant Date: 20160218
Lead Channel Impedance Value: 833 Ohm
Lead Channel Pacing Threshold Amplitude: 0.7 V
Lead Channel Pacing Threshold Pulse Width: 0.5 ms
Lead Channel Setting Pacing Amplitude: 2 V
Lead Channel Setting Pacing Pulse Width: 0.5 ms
Lead Channel Setting Sensing Sensitivity: 0.3 mV
Pulse Gen Serial Number: 202913

## 2020-03-17 MED ORDER — FUROSEMIDE 20 MG PO TABS: 20.0000 mg | ORAL_TABLET | Freq: Every day | ORAL | 3 refills | Status: DC

## 2020-03-17 MED ORDER — LOSARTAN POTASSIUM 100 MG PO TABS: 100.0000 mg | ORAL_TABLET | Freq: Every day | ORAL | 3 refills | Status: DC

## 2020-03-17 NOTE — Telephone Encounter (Signed)
Pt's medication was sent to pt's pharmacy as requested. Confirmation received.  °

## 2020-03-17 NOTE — Telephone Encounter (Signed)
*  STAT* If patient is at the pharmacy, call can be transferred to refill team.   1. Which medications need to be refilled? (please list name of each medication and dose if known) furosemide (LASIX) 20 MG tablet  2. Which pharmacy/location (including street and city if local pharmacy) is medication to be sent to? [facility] [address] [place] - [address]  3. Do they need a 30 day or 90 day supply? 90 day supply

## 2020-03-23 ENCOUNTER — Other Ambulatory Visit (HOSPITAL_COMMUNITY): Payer: Self-pay | Admitting: Physician Assistant

## 2020-03-23 DIAGNOSIS — R2681 Unsteadiness on feet: Secondary | ICD-10-CM

## 2020-03-23 NOTE — Progress Notes (Signed)
Remote ICD transmission.   

## 2020-03-24 ENCOUNTER — Other Ambulatory Visit (HOSPITAL_COMMUNITY): Payer: Self-pay | Admitting: Physician Assistant

## 2020-03-24 DIAGNOSIS — R2681 Unsteadiness on feet: Secondary | ICD-10-CM

## 2020-04-14 ENCOUNTER — Other Ambulatory Visit: Payer: Self-pay

## 2020-04-14 ENCOUNTER — Ambulatory Visit (HOSPITAL_COMMUNITY)
Admission: RE | Admit: 2020-04-14 | Discharge: 2020-04-14 | Disposition: A | Discharge status: Home / Self Care | Payer: Medicare Other | Source: Ambulatory Visit | Attending: Physician Assistant | Admitting: Physician Assistant

## 2020-04-14 DIAGNOSIS — R2681 Unsteadiness on feet: Secondary | ICD-10-CM

## 2020-04-14 NOTE — Progress Notes (Signed)
Per device rep and cardiology PA, programed patient's ICD off/MRI safe mode for scan. Will monitor patient during the scan and will program back to regular setting after scan and review with device rep.

## 2020-04-21 ENCOUNTER — Ambulatory Visit: Payer: Medicare Other | Admitting: Neurology

## 2020-04-28 ENCOUNTER — Telehealth: Payer: Self-pay | Admitting: *Deleted

## 2020-04-28 NOTE — Telephone Encounter (Signed)
Patient with diagnosis of atrial fibrillation on Xarelto for anticoagulation.   Procedure: L4-5 Lumbar Fusion  Date of procedure: TBD  CHA2DS2-VASc Score = 6  This indicates a 9.7% annual risk of stroke. The patient's score is based upon: CHF History: Yes HTN History: Yes Diabetes History: Yes Stroke History: Yes Vascular Disease History: Yes Age Score: 0 Gender Score: 0   CrCl >100 ml/min Platelet count 189K  Received similar clearance request on 11/26/19 and clearance was routed to Dr. Mayford Knife for further evaluation given history of stroke and afib. Per Dr. Mayford Knife, "I think he is high risk to come off anticoagulation for procedure. If he needs the procedure patient and Neurosurgery need to understand risk associated with holding anticoagulation from a cardioembolic standpoint and accept those risks."  If patient is to hold Xarelto for 3 days, similar discussion regarding risk vs benefit will need to discussed with patient.

## 2020-04-28 NOTE — Telephone Encounter (Signed)
The risk remains the same as it was for the spinal injection and he will need to discuss with his neurosurgeon

## 2020-04-28 NOTE — Telephone Encounter (Signed)
Dr. Mayford Knife  Can you comment on this patient undergoing lumbar surgery. It appears you felt he was high risk to come off [facility] with prior back injections given hx of CVA as well as AF. You had deferred final decision to neurology. You last saw him 02/2020 at which time he was stable but was having visual disturbances. He wore a monitor that showed several episodes of SVT.   Please send your recommendations to the pre-op pool   Thank you Noreene Larsson

## 2020-04-28 NOTE — Telephone Encounter (Signed)
   [facility] Pre-operative Risk Assessment    HEARTCARE STAFF: - Please ensure there is not already an duplicate clearance open for this procedure. - Under Visit Info/Reason for Call, type in Other and utilize the format Clearance MM/DD/YY or Clearance TBD. Do not use dashes or single digits. - If request is for dental extraction, please clarify the # of teeth to be extracted.  Request for surgical clearance:  1. What type of surgery is being performed? L4-5 Lumbar Fusion   2. When is this surgery scheduled? TBD   3. What type of clearance is required (medical clearance vs. Pharmacy clearance to hold med vs. Both)? Both  4. Are there any medications that need to be held prior to surgery and how long?Xarelto   5. Practice name and name of physician performing surgery? [place], Dr Consuella Lose  6. What is the office phone number? [phone]   7.   What is the office fax number? [phone]  8.   Anesthesia type (None, local, MAC, general) ? General   Bence Trapp L 04/28/2020, 8:33 AM  _________________________________________________________________   (provider comments below)

## 2020-04-29 NOTE — Telephone Encounter (Signed)
   Primary Cardiologist: Will Jorja Loa, MD  Chart reviewed as part of pre-operative protocol coverage. Given past medical history and time since last visit, based on ACC/AHA guidelines, Josie Mesa would be at acceptable risk for the planned procedure without further cardiovascular testing.   Patient with diagnosis of atrial fibrillation on Xarelto for anticoagulation.   Procedure: L4-5 Lumbar Fusion Date of procedure: TBD  CHA2DS2-VASc Score = 6  This indicates a 9.7% annual risk of stroke. ThPatient with diagnosis of atrial fibrillation on Xarelto for anticoagulation.   Procedure: L4-5 Lumbar Fusion Date of procedure: TBD  CHA2DS2-VASc Score = 6  This indicates a 9.7% annual risk of stroke. The patient's score is based upon: CHF History: Yes HTN History: Yes Diabetes History: Yes Stroke History: Yes Vascular Disease History: Yes Age Score: 0 Gender Score: 0   CrCl >100 ml/min Platelet count 189K  Received similar clearance request on 11/26/19 and clearance was routed to Dr. Mayford Knife for further evaluation given history of stroke and afib. Per Dr. Mayford Knife, "I think he is high risk to come off anticoagulation for procedure. If he needs the procedure patient and Neurosurgery need to understand risk associated with holding anticoagulation from a cardioembolic standpoint and accept those risks."  If patient is to hold Xarelto for 3 days, similar discussion regarding risk vs benefit will need to discussed with patient.  I will route this recommendation to the requesting party via Epic fax function and remove from pre-op pool.  Please call with questions.  Thomasene Ripple. Tyneisha Hegeman NP-C    04/29/2020, 8:55 AM [facility] [address] Office [phone] Fax [phone]

## 2020-05-05 ENCOUNTER — Telehealth: Payer: Self-pay | Admitting: Cardiology

## 2020-05-05 ENCOUNTER — Telehealth: Payer: Self-pay | Admitting: *Deleted

## 2020-05-05 DIAGNOSIS — G4733 Obstructive sleep apnea (adult) (pediatric): Secondary | ICD-10-CM

## 2020-05-05 NOTE — Telephone Encounter (Signed)
Patient called having trouble with his cpap pressure blowing too strong. Dr Mayford Knife has been notified by secure chat.

## 2020-05-05 NOTE — Telephone Encounter (Signed)
1) What problem are you experiencing? Blowing too hard can't wear it   2) Who is your medical equipment company? [employer]   Kyle Peterson is calling stating [employer] advised him they can try to remotely adjust his machine, but they need Dr. Mayford Knife to fax the prescription in order to do so. They gave [name] 2 fax's numbers to send it to. The first number is [phone] "ATTN CPAP INTAKE" second fax # [phone] "ATTN CPAP INTAKE" as well. He is requesting this be sent ASAP due to wanting it adjusted before he goes to bed tonight. He was only able to sleep 3 hours last night due to this issue. Please advise.    Please route to the sleep study assistant.

## 2020-06-16 ENCOUNTER — Ambulatory Visit (INDEPENDENT_AMBULATORY_CARE_PROVIDER_SITE_OTHER): Payer: Medicare Other

## 2020-06-16 DIAGNOSIS — I421 Obstructive hypertrophic cardiomyopathy: Secondary | ICD-10-CM

## 2020-06-16 LAB — CUP PACEART REMOTE DEVICE CHECK
Battery Remaining Longevity: 144 mo
Battery Remaining Percentage: 100 %
Brady Statistic RV Percent Paced: 0 %
Date Time Interrogation Session: 20220517023100
HighPow Impedance: 69 Ohm
Implantable Lead Implant Date: 20160218
Implantable Lead Location: 753860
Implantable Lead Model: 292
Implantable Lead Serial Number: 358834
Implantable Pulse Generator Implant Date: 20160218
Lead Channel Impedance Value: 868 Ohm
Lead Channel Pacing Threshold Amplitude: 0.7 V
Lead Channel Pacing Threshold Pulse Width: 0.5 ms
Lead Channel Setting Pacing Amplitude: 2 V
Lead Channel Setting Pacing Pulse Width: 0.5 ms
Lead Channel Setting Sensing Sensitivity: 0.3 mV
Pulse Gen Serial Number: 202913

## 2020-06-24 NOTE — Telephone Encounter (Signed)
Per dr Mayford Knife, change to auto BiPAP with IPAP max 18cm H2O and EPAP min 5cm H2O with PS 4cm H2O and get a download 2 weeks after new settings  Order placed to [employer] via community message

## 2020-06-24 NOTE — Addendum Note (Signed)
Addended by: Reesa Chew on: 06/24/2020 06:16 PM   Modules accepted: Orders

## 2020-06-24 NOTE — Telephone Encounter (Signed)
Per dr Mayford Knife, change to auto BiPAP with IPAP max 18cm H2O and EPAP min 5cm H2O with PS 4cm H2O and get a download 2 weeks after new settings

## 2020-07-09 NOTE — Progress Notes (Signed)
Remote ICD transmission.   

## 2020-08-20 ENCOUNTER — Other Ambulatory Visit: Payer: Self-pay | Admitting: Cardiology

## 2020-08-20 ENCOUNTER — Telehealth: Payer: Self-pay | Admitting: Cardiology

## 2020-08-20 DIAGNOSIS — I480 Paroxysmal atrial fibrillation: Secondary | ICD-10-CM

## 2020-08-20 MED ORDER — RIVAROXABAN 20 MG PO TABS: 20.0000 mg | ORAL_TABLET | Freq: Every day | ORAL | 1 refills | Status: DC

## 2020-08-20 NOTE — Telephone Encounter (Signed)
Pt last saw Dr Mayford Knife 02/12/20, last labs 07/31/20 Creat 1.1, age 55, weight 131.1kg, CrCl 142.36, based on CrCl pt is on appropriate dosage of Xarelto 20mg  QD.  Will refill rx.

## 2020-08-20 NOTE — Telephone Encounter (Signed)
Pt last saw Dr Turner 02/12/20, last labs 07/31/20 Creat 1.1, age 55, weight 131.1kg, CrCl 142.36, based on CrCl pt is on appropriate dosage of Xarelto 20mg QD.  Will refill rx.  

## 2020-08-20 NOTE — Telephone Encounter (Signed)
*  STAT* If patient is at the pharmacy, call can be transferred to refill team.   1. Which medications need to be refilled? (please list name of each medication and dose if known) XARELTO 20 MG TABS tablet  2. Which pharmacy/location (including street and city if local pharmacy) is medication to be sent to? [facility] [address] [place] - [address]  3. Do they need a 30 day or 90 day supply? 90  Patient took his last pill today. And he take the pill in the morning time. Patient would like to get it refilled toay

## 2020-08-26 ENCOUNTER — Telehealth: Payer: Self-pay | Admitting: Cardiology

## 2020-08-26 ENCOUNTER — Other Ambulatory Visit: Payer: Self-pay

## 2020-08-26 ENCOUNTER — Emergency Department
Admission: EM | Admit: 2020-08-26 | Discharge: 2020-08-26 | Disposition: A | Discharge status: Home / Self Care | Payer: Medicare Other | Attending: Emergency Medicine | Admitting: Emergency Medicine

## 2020-08-26 DIAGNOSIS — R0789 Other chest pain: Secondary | ICD-10-CM | POA: Insufficient documentation

## 2020-08-26 DIAGNOSIS — I480 Paroxysmal atrial fibrillation: Secondary | ICD-10-CM | POA: Diagnosis not present

## 2020-08-26 DIAGNOSIS — I509 Heart failure, unspecified: Secondary | ICD-10-CM | POA: Diagnosis not present

## 2020-08-26 DIAGNOSIS — Z79899 Other long term (current) drug therapy: Secondary | ICD-10-CM | POA: Insufficient documentation

## 2020-08-26 DIAGNOSIS — R110 Nausea: Secondary | ICD-10-CM | POA: Insufficient documentation

## 2020-08-26 DIAGNOSIS — I2510 Atherosclerotic heart disease of native coronary artery without angina pectoris: Secondary | ICD-10-CM | POA: Insufficient documentation

## 2020-08-26 DIAGNOSIS — I110 Hypertensive heart disease with heart failure: Secondary | ICD-10-CM | POA: Insufficient documentation

## 2020-08-26 DIAGNOSIS — E119 Type 2 diabetes mellitus without complications: Secondary | ICD-10-CM | POA: Diagnosis not present

## 2020-08-26 DIAGNOSIS — Z7901 Long term (current) use of anticoagulants: Secondary | ICD-10-CM | POA: Diagnosis not present

## 2020-08-26 DIAGNOSIS — F17210 Nicotine dependence, cigarettes, uncomplicated: Secondary | ICD-10-CM | POA: Insufficient documentation

## 2020-08-26 DIAGNOSIS — R61 Generalized hyperhidrosis: Secondary | ICD-10-CM | POA: Insufficient documentation

## 2020-08-26 DIAGNOSIS — Z7984 Long term (current) use of oral hypoglycemic drugs: Secondary | ICD-10-CM | POA: Insufficient documentation

## 2020-08-26 DIAGNOSIS — R6884 Jaw pain: Secondary | ICD-10-CM

## 2020-08-26 LAB — CBC WITH DIFFERENTIAL/PLATELET
Abs Immature Granulocytes: 0.04 10*3/uL (ref 0.00–0.07)
Basophils Absolute: 0.1 10*3/uL (ref 0.0–0.1)
Basophils Relative: 1 %
Eosinophils Absolute: 0.1 10*3/uL (ref 0.0–0.5)
Eosinophils Relative: 1 %
HCT: 48.3 % (ref 39.0–52.0)
Hemoglobin: 16.8 g/dL (ref 13.0–17.0)
Immature Granulocytes: 0 %
Lymphocytes Relative: 17 %
Lymphs Abs: 1.7 10*3/uL (ref 0.7–4.0)
MCH: 31.2 pg (ref 26.0–34.0)
MCHC: 34.8 g/dL (ref 30.0–36.0)
MCV: 89.8 fL (ref 80.0–100.0)
Monocytes Absolute: 0.5 10*3/uL (ref 0.1–1.0)
Monocytes Relative: 6 %
Neutro Abs: 7.3 10*3/uL (ref 1.7–7.7)
Neutrophils Relative %: 75 %
Platelets: 208 10*3/uL (ref 150–400)
RBC: 5.38 MIL/uL (ref 4.22–5.81)
RDW: 13.1 % (ref 11.5–15.5)
WBC: 9.7 10*3/uL (ref 4.0–10.5)
nRBC: 0 % (ref 0.0–0.2)

## 2020-08-26 LAB — BASIC METABOLIC PANEL
Anion gap: 8 (ref 5–15)
BUN: 8 mg/dL (ref 6–20)
CO2: 23 mmol/L (ref 22–32)
Calcium: 8.8 mg/dL — ABNORMAL LOW (ref 8.9–10.3)
Chloride: 105 mmol/L (ref 98–111)
Creatinine, Ser: 0.87 mg/dL (ref 0.61–1.24)
GFR, Estimated: >60 mL/min (ref >60)
Glucose, Bld: 147 mg/dL — ABNORMAL HIGH (ref 70–99)
Potassium: 3.6 mmol/L (ref 3.5–5.1)
Sodium: 136 mmol/L (ref 135–145)

## 2020-08-26 LAB — TROPONIN I (HIGH SENSITIVITY): Troponin I (High Sensitivity): 17 ng/L (ref <18)

## 2020-08-26 MED ORDER — AMOXICILLIN 500 MG PO CAPS: 500.0000 mg | ORAL_CAPSULE | Freq: Three times a day (TID) | ORAL | 0 refills | Status: AC

## 2020-08-26 MED ORDER — METOPROLOL SUCCINATE ER 50 MG PO TB24: ORAL_TABLET | ORAL | 3 refills | Status: DC

## 2020-08-26 NOTE — Telephone Encounter (Signed)
*  STAT* If patient is at the pharmacy, call can be transferred to refill team.   1. Which medications need to be refilled? (please list name of each medication and dose if known) metoprolol  2. Which pharmacy/location (including street and city if local pharmacy) is medication to be sent to?[facility] number [phone] 3. Do they need a 30 day or 90 day supply? 90

## 2020-08-26 NOTE — Discharge Instructions (Addendum)
Return to the ER for new, worsening, or recurrent lightheadedness, sweating, feeling clammy, feel like you are going to pass out, chest pain, difficulty breathing, or any other new or worsening symptoms that concern you.  Follow-up with your primary care doctor and cardiologist.  If your jaw pain recurs or persists you can start the amoxicillin and you should follow-up with your dentist.  If you have no significant further jaw pain you do not need to take the antibiotic.

## 2020-08-26 NOTE — Telephone Encounter (Signed)
Refill for Metoprolol has been sent to [facility] per pt request.

## 2020-08-26 NOTE — ED Triage Notes (Signed)
Patient brought in via ems from home. Patient c/o left jaw pain for a few days. States he has teeth issues that he has not followed up with. States the pain was so bad last night he started to have chest pain

## 2020-08-26 NOTE — ED Provider Notes (Signed)
[facility] Emergency Department Provider Note ____________________________________________   Event Date/Time   First MD Initiated Contact with Patient 08/26/20 [phone]     (approximate)  I have reviewed the triage vital signs and the nursing notes.   HISTORY  Chief Complaint Jaw Pain    HPI Kyle Peterson is a 55 y.o. male with PMH as noted below including CAD, CHF status post AICD placement, hypertension, and OSA who presents with left jaw pain since last night, gradual onset, persistent course, improved with Anbesol and with drinking water.  He denies any radiation of the pain.  He states that he had difficulty sleeping last night and then around 4 AM began having nausea, feeling clammy and diaphoretic, and had some chest discomfort but no actual chest pain.  He states that the symptoms have subsequently completely resolved, and the jaw pain has also significantly improved.  He has known decayed left upper molars that he needs to go to the dentist for but has been putting it off.   Past Medical History:  Diagnosis Date   AICD (automatic cardioverter/defibrillator) present    bostan scien   Anginal pain (HCC)    ?heart pain   per dr Estill Doomscamntiz   Anxiety    Arthritis    ddd   Ascending aortic aneurysm (HCC)    38mm by echo 2021   CAD (coronary artery disease), native coronary artery 08/19/2015   CHF (congestive heart failure) (HCC)    Depression    Diabetes mellitus without complication (HCC)    no meds  new dx   Heart murmur    High cholesterol    Hypertension    Myocardial infarction (HCC)    OSA (obstructive sleep apnea)    Severe with AHI 80/hr- new bipap   PAF (paroxysmal atrial fibrillation) (HCC) 08/19/2015   PTSD (post-traumatic stress disorder)    Stroke (HCC) 2016   Subjective visual disturbance 03/03/2020   Substance abuse (HCC)    SVT (supraventricular tachycardia) (HCC)    nonsustained up to 17 beats in a row    Patient Active Problem List    Diagnosis Date Noted   SVT (supraventricular tachycardia) (HCC)    Subjective visual disturbance 03/03/2020   Mitral regurgitation 08/15/2019   Long term (current) use of anticoagulants 08/15/2019   Cardiac defibrillator in place 08/15/2019   Unsteadiness on feet 10/25/2018   Ascending aortic aneurysm (HCC)    Chronic bilateral low back pain 12/13/2017   Posterior tibial tendonitis, right 12/13/2017   Class 3 severe obesity due to excess calories with serious comorbidity and body mass index (BMI) of 40.0 to 44.9 in adult (HCC) 12/13/2017   Chest mass 05/30/2017   S/P left rotator cuff repair 10/25/2016   Severe obesity (BMI >= 40) (HCC) 09/27/2016   Dilated aortic root (HCC) 07/20/2016   Claudication (HCC) 07/20/2016   Heart murmur 07/20/2016   Trigger finger, left middle finger 06/21/2016   Diabetes (HCC) 05/24/2016   Depression, major, recurrent, moderate (HCC) 11/05/2015   Chronic post-traumatic stress disorder (PTSD) 11/05/2015    Class: Chronic   OSA (obstructive sleep apnea)    Persistent atrial fibrillation (HCC) 08/19/2015   CAD (coronary artery disease), native coronary artery 08/19/2015   Shoulder pain 06/11/2015   Hyperlipidemia 06/10/2015   Tobacco use disorder 05/28/2015   Essential hypertension 05/27/2015   Bipolar disorder (HCC) 05/27/2015   Hypertrophic cardiomyopathy (HCC) 05/27/2015   Atrial flutter (HCC) 02/20/2014   Fluid overload 02/05/2014   Atelectasis 02/04/2014  Schizoaffective disorder (HCC) 11/27/2013   S/P ventricular septal myectomy 02/13/2013    Past Surgical History:  Procedure Laterality Date   CARDIAC CATHETERIZATION     01/13/14 [facility]: LM NL, mild narrowing of proximal/mid LAD, mid LCX, proximal RCA. 30% ostial OM1. 40% distal RCA. Medical tx.    CARPAL TUNNEL RELEASE Left 11/02/2015   CERVICAL DISCECTOMY     MYOMECTOMY     SHOULDER ARTHROSCOPY WITH ROTATOR CUFF REPAIR Left 09/09/2016   Procedure: LEFT SHOULDER ARTHROSCOPY  WITH MINI OPEN ROTATOR CUFF REPAIR;  Surgeon: Eldred Manges, MD;  Location: [facility] OR;  Service: Orthopedics;  Laterality: Left;   TONSILLECTOMY      Prior to Admission medications   Medication Sig Start Date End Date Taking? Authorizing Provider  amoxicillin (AMOXIL) 500 MG capsule Take 1 capsule (500 mg total) by mouth 3 (three) times daily for 7 days. 08/26/20 09/02/20 Yes Dionne Bucy, MD  albuterol (VENTOLIN HFA) 108 (90 Base) MCG/ACT inhaler INHALE 1 TO 2 PUFFS BY MOUTH EVERY 6 HOURS AS NEEDED 05/06/19   [provider]  atorvastatin (LIPITOR) 40 MG tablet Take 1 tablet (40 mg total) by mouth daily. 01/11/19   Quintella Reichert, MD  diltiazem (TIAZAC) 300 MG 24 hr capsule Take 1 capsule (300 mg total) by mouth daily. 03/12/20   Quintella Reichert, MD  furosemide (LASIX) 20 MG tablet Take 1 tablet (20 mg total) by mouth daily. 03/17/20   Quintella Reichert, MD  gabapentin (NEURONTIN) 300 MG capsule Take 300 mg by mouth daily.  05/06/19   [provider]  losartan (COZAAR) 100 MG tablet Take 1 tablet (100 mg total) by mouth daily. 03/17/20   Quintella Reichert, MD  metFORMIN (GLUCOPHAGE) 500 MG tablet Take 500 mg by mouth 2 (two) times daily with a meal.    [provider]  metoprolol succinate (TOPROL-XL) 50 MG 24 hr tablet TAKE 1 TABLET BY MOUTH ONCE DAILY WITH A MEAL OR  IMMEDIATELY  FOLLOWING  A  MEAL 08/26/20   Quintella Reichert, MD  montelukast (SINGULAIR) 10 MG tablet  05/27/19   [provider]  orphenadrine (NORFLEX) 100 MG tablet Take 100 mg by mouth as needed for muscle spasms.    [provider]  rivaroxaban (XARELTO) 20 MG TABS tablet Take 1 tablet (20 mg total) by mouth daily. 08/20/20   Quintella Reichert, MD    Allergies Ace inhibitors  Family History  Problem Relation Age of Onset   Diabetes Mother    Hypertension Mother    Diabetes Father    Hypertension Father    Heart disease Father    Alcohol abuse Father    Alcohol abuse Brother    Drug  abuse Brother     Social History Social History   Tobacco Use   Smoking status: Every Day    Packs/day: 0.50    Years: 40.00    Pack years: 20.00    Types: Cigarettes   Smokeless tobacco: Never   Tobacco comments:    Trying to cut back   Substance Use Topics   Alcohol use: No    Alcohol/week: 0.0 standard drinks   Drug use: Yes    Frequency: 3.0 times per week    Types: Marijuana    Comment: last 07/26/16    Review of Systems  Constitutional: No fever.  Positive for fatigue. Eyes: No visual changes. ENT: No sore throat.  Left jaw pain. Cardiovascular: Positive for resolved chest discomfort Respiratory:  Denies shortness of breath. Gastrointestinal: Positive for resolved nausea. Genitourinary: Negative for dysuria.  Musculoskeletal: Negative for back pain. Skin: Negative for rash. Neurological: Negative for headaches.   ____________________________________________   PHYSICAL EXAM:  VITAL SIGNS: ED Triage Vitals  Enc Vitals Group     BP 08/26/20 1000 136/79     Pulse Rate 08/26/20 1000 64     Resp 08/26/20 1000 16     Temp 08/26/20 1000 98.9 F (37.2 C)     Temp Source 08/26/20 1000 Oral     SpO2 08/26/20 1000 99 %     Weight 08/26/20 1001 290 lb (131.5 kg)     Height 08/26/20 1001 5\' 8"  (1.727 m)     Head Circumference --      Peak Flow --      Pain Score 08/26/20 1001 5     Pain Loc --      Pain Edu? --      Excl. in GC? --     Constitutional: Alert and oriented. Well appearing and in no acute distress. Eyes: Conjunctivae are normal.  Head: Atraumatic. Nose: No congestion/rhinnorhea. Mouth/Throat: Mucous membranes are moist.  Oropharynx clear.  Left upper molars and gums with no significant surrounding erythema, induration, or fluctuance.  Full range of motion of mandible.  No external tenderness. Neck: Normal range of motion.  Cardiovascular: Normal rate, regular rhythm. Grossly normal heart sounds.  Good peripheral circulation. Respiratory: Normal  respiratory effort.  No retractions. Lungs CTAB. Gastrointestinal: No distention.  Musculoskeletal: No lower extremity edema.  Extremities warm and well perfused.  Neurologic:  Normal speech and language. No gross focal neurologic deficits are appreciated.  Skin:  Skin is warm and dry. No rash noted. Psychiatric: Mood and affect are normal. Speech and behavior are normal.  ____________________________________________   LABS (all labs ordered are listed, but only abnormal results are displayed)  Labs Reviewed  BASIC METABOLIC PANEL - Abnormal; Notable for the following components:      Result Value   Glucose, Bld 147 (*)    Calcium 8.8 (*)    All other components within normal limits  CBC WITH DIFFERENTIAL/PLATELET  TROPONIN I (HIGH SENSITIVITY)  TROPONIN I (HIGH SENSITIVITY)   ____________________________________________  EKG  ED ECG REPORT I, 08/28/20, the attending physician, personally viewed and interpreted this ECG.  Date: 08/26/2020 EKG Time: 0949 Rate: 59 Rhythm: normal sinus rhythm QRS Axis: normal Intervals: Prolonged PR, nonspecific IVCD ST/T Wave abnormalities: normal Narrative Interpretation: Nonspecific IVCD with no evidence of acute ischemia; no significant change when compared to EKG of 03/20/2017  ____________________________________________  RADIOLOGY    ____________________________________________   PROCEDURES  Procedure(s) performed: No  Procedures  Critical Care performed: No ____________________________________________   INITIAL IMPRESSION / ASSESSMENT AND PLAN / ED COURSE  Pertinent labs & imaging results that were available during my care of the patient were reviewed by me and considered in my medical decision making (see chart for details).   55 y.o. male with PMH as noted below including CAD, CHF status post AICD placement, atrial fibrillation on Xarelto, hypertension, and OSA presents with left jaw pain since yesterday  which he attributes to bad teeth, but associated this morning with nausea, diaphoresis, clamminess, which he states feels similar to when he previously had a heart attack.  The symptoms have subsequently resolved and the jaw pain has markedly improved.  I reviewed the past medical records in Epic.  The patient has no recent ED visits or admissions.  Exam  he is overall well-appearing.  His vital signs are normal.  The physical exam is unremarkable.  EKG shows nonspecific IVCD, likely LBBB, and is unchanged from prior.  Overall I suspect more likely that the patient's nausea and dizziness were not cardiac in origin; he reports sleeping poorly and may have had a vasovagal episode related to the acute pain.  However given his high risk cardiac history we will obtain basic labs and cardiac enzymes.  The jaw pain could be TMJ or possible dental infection although there is no specific evidence of it on exam.  ----------------------------------------- 11:59 AM on 08/26/2020 -----------------------------------------  Basic labs and troponin are negative.  There is no evidence of ACS at this time.  Given that it was been over 6 hours since symptom onset when the troponin was obtained, there is no strict indication for a repeat.  I offered for the patient to stay for a repeat and explained that this helps [place] rule out ACS, however he has been asymptomatic throughout his ED stay, feels well, and wants to go home.  I think that this is reasonable.  I counseled the patient on the results of the work-up.  I will prescribe amoxicillin for possible dental infection although I instructed the patient that if the jaw pain is mostly resolved and does not recur he does not need to take antibiotic.  I recommended that he follow-up with his PMD and his dentist.  Return precautions given, and he expresses understanding.  ____________________________________________   FINAL CLINICAL IMPRESSION(S) / ED DIAGNOSES  Final  diagnoses:  Jaw pain      NEW MEDICATIONS STARTED DURING THIS VISIT:  New Prescriptions   AMOXICILLIN (AMOXIL) 500 MG CAPSULE    Take 1 capsule (500 mg total) by mouth 3 (three) times daily for 7 days.     Note:  This document was prepared using Dragon voice recognition software and may include unintentional dictation errors.    Dionne Bucy, MD 08/26/20 1200

## 2020-09-09 NOTE — Progress Notes (Signed)
Electrophysiology Office Note Date: 09/10/2020  ID:  Trejon Duford, DOB 28-Oct-1965, MRN [id number]  PCP: Patient, No Pcp Per (Inactive) Primary Cardiologist: Will Jorja Loa, MD Electrophysiologist: Will Jorja Loa, MD   CC: Routine ICD follow-up  Bennet Kujawa is a 55 y.o. male seen today for Will Jorja Loa, MD for cardiac clearance and routine ICD follow up.  Since last being seen in our clinic the patient reports doing OK.   Pt seen in ED for jaw pain. With h/o of anginal equivalent similar in the past he called 911. He states EMS told him he was having a "small heart attack".  (Looking back at old EKGs, were likely thrown off by longstanding LBBB). HS trop was negative. Felt to be jaw/tooth pain and dental f/u recommended.    Pt has followed up and been recommended for 3+ dental extractions. He is overall doing well. No further episodes of pain. He remains concerned with the nausea and diaphoresis accompanying his episode that his heart is the issue. He denies syncope. Denies dyspnea, PND, or orthopnea. No ICD shocks.   Device History: Magazine features editor ICD implanted 2016 for HOCM  Past Medical History:  Diagnosis Date   AICD (automatic cardioverter/defibrillator) present    bostan scien   Anginal pain (HCC)    ?heart pain   per dr Estill Dooms   Anxiety    Arthritis    ddd   Ascending aortic aneurysm (HCC)    65mm by echo 2021   CAD (coronary artery disease), native coronary artery 08/19/2015   CHF (congestive heart failure) (HCC)    Depression    Diabetes mellitus without complication (HCC)    no meds  new dx   Heart murmur    High cholesterol    Hypertension    Myocardial infarction (HCC)    OSA (obstructive sleep apnea)    Severe with AHI 80/hr- new bipap   PAF (paroxysmal atrial fibrillation) (HCC) 08/19/2015   PTSD (post-traumatic stress disorder)    Stroke (HCC) 2016   Subjective visual disturbance 03/03/2020   Substance abuse (HCC)    SVT  (supraventricular tachycardia) (HCC)    nonsustained up to 17 beats in a row   Past Surgical History:  Procedure Laterality Date   CARDIAC CATHETERIZATION     01/13/14 [facility]: LM NL, mild narrowing of proximal/mid LAD, mid LCX, proximal RCA. 30% ostial OM1. 40% distal RCA. Medical tx.    CARPAL TUNNEL RELEASE Left 11/02/2015   CERVICAL DISCECTOMY     MYOMECTOMY     SHOULDER ARTHROSCOPY WITH ROTATOR CUFF REPAIR Left 09/09/2016   Procedure: LEFT SHOULDER ARTHROSCOPY WITH MINI OPEN ROTATOR CUFF REPAIR;  Surgeon: Eldred Manges, MD;  Location: [facility] OR;  Service: Orthopedics;  Laterality: Left;   TONSILLECTOMY      Current Outpatient Medications  Medication Sig Dispense Refill   albuterol (VENTOLIN HFA) 108 (90 Base) MCG/ACT inhaler INHALE 1 TO 2 PUFFS BY MOUTH EVERY 6 HOURS AS NEEDED     atorvastatin (LIPITOR) 40 MG tablet Take 1 tablet (40 mg total) by mouth daily. 30 tablet 6   diltiazem (TIAZAC) 300 MG 24 hr capsule Take 1 capsule (300 mg total) by mouth daily. 90 capsule 3   furosemide (LASIX) 20 MG tablet Take 1 tablet (20 mg total) by mouth daily. 90 tablet 3   losartan (COZAAR) 100 MG tablet Take 1 tablet (100 mg total) by mouth daily. 90 tablet 3   metFORMIN (GLUCOPHAGE) 500 MG tablet  Take 500 mg by mouth 2 (two) times daily with a meal.     metoprolol succinate (TOPROL-XL) 50 MG 24 hr tablet TAKE 1 TABLET BY MOUTH ONCE DAILY WITH A MEAL OR  IMMEDIATELY  FOLLOWING  A  MEAL 90 tablet 3   rivaroxaban (XARELTO) 20 MG TABS tablet Take 1 tablet (20 mg total) by mouth daily. 90 tablet 1   No current facility-administered medications for this visit.    Allergies:   Ace inhibitors   Social History: Social History   Socioeconomic History   Marital status: Divorced    Spouse name: Not on file   Number of children: Not on file   Years of education: Not on file   Highest education level: Not on file  Occupational History   Occupation: disability  Tobacco Use   Smoking status:  Every Day    Packs/day: 0.50    Years: 40.00    Pack years: 20.00    Types: Cigarettes   Smokeless tobacco: Never   Tobacco comments:    Trying to cut back   Substance and Sexual Activity   Alcohol use: No    Alcohol/week: 0.0 standard drinks   Drug use: Yes    Frequency: 3.0 times per week    Types: Marijuana    Comment: last 07/26/16   Sexual activity: Not Currently  Other Topics Concern   Not on file  Social History Narrative   Lives alone   Drinks 20+ cups caffeine daily   Right Handed   Social Determinants of Health   Financial Resource Strain: Not on file  Food Insecurity: Not on file  Transportation Needs: Not on file  Physical Activity: Not on file  Stress: Not on file  Social Connections: Not on file  Intimate Partner Violence: Not on file    Family History: Family History  Problem Relation Age of Onset   Diabetes Mother    Hypertension Mother    Diabetes Father    Hypertension Father    Heart disease Father    Alcohol abuse Father    Alcohol abuse Brother    Drug abuse Brother     Review of Systems: All other systems reviewed and are otherwise negative except as noted above.   Physical Exam: Vitals:   09/10/20 0810  BP: (!) 152/90  Pulse: 78  SpO2: 96%  Weight: 268 lb 9.6 oz (121.8 kg)  Height: 5\' 8"  (1.727 m)     GEN- The patient is well appearing, alert and oriented x 3 today.   HEENT: normocephalic, atraumatic; sclera clear, conjunctiva pink; hearing intact; oropharynx clear; neck supple, no JVP Lymph- no cervical lymphadenopathy Lungs- Clear to ausculation bilaterally, normal work of breathing.  No wheezes, rales, rhonchi Heart- Regular rate and rhythm, no murmurs, rubs or gallops, PMI not laterally displaced GI- soft, non-tender, non-distended, bowel sounds present, no hepatosplenomegaly Extremities- no clubbing or cyanosis. No edema; DP/PT/radial pulses 2+ bilaterally MS- no significant deformity or atrophy Skin- warm and dry, no  rash or lesion; ICD pocket well healed Psych- euthymic mood, full affect Neuro- strength and sensation are intact  ICD interrogation- reviewed in detail today,  See PACEART report  EKG:  EKG is not ordered today. Personal review of EKG ordered  08/26/20  shows sinus brady at 59 bpm with LBBB at 185 ms  Recent Labs: 02/24/2020: ALT 22 08/26/2020: BUN 8; Creatinine, Ser 0.87; Hemoglobin 16.8; Platelets 208; Potassium 3.6; Sodium 136   Wt Readings from Last 3 Encounters:  09/10/20 268 lb 9.6 oz (121.8 kg)  08/26/20 290 lb (131.5 kg)  03/03/20 289 lb (131.1 kg)     Other studies Reviewed: Additional studies/ records that were reviewed today include: Previous EP, gen chards, and ED notes   Assessment and Plan:  1.  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 OSA Encouraged nightly BiPAP  3. Paroxysmal atrial fibrillation NSR today Continue Xarelto for CHA2DS2-VASc of at least 4.  Pt chart says that he has h/o stroke in 2016. Pt denies h/o stroke. CT head 05/29/16 with normal gray-white compartments.    4. CAD H/o chest pain Low risk myoview 07/2019  5. Cardiac clearance for dental extraction Low risk myoview 07/2019 with EF 49% EF normal by echo 10/2019 With recurrent chest pain and concerns for anginal equivalent, despite negative HS trop on admission would recommend updating his Myoview prior to proceeding with extractions.   Current medicines are reviewed at length with the patient today.   The patient does not have concerns regarding his medicines.  The following changes were made today:  none  Labs/ tests ordered today include:  Orders Placed This Encounter  Procedures   Myocardial Perfusion Imaging   Disposition:   Follow up with Dr. Elberta Fortis  6 months.  Dustin Flock,  PA-C  09/10/2020 8:23 AM  [facility] [address] [place] [address] [phone] (office) [phone] (fax)

## 2020-09-10 ENCOUNTER — Other Ambulatory Visit: Payer: Self-pay

## 2020-09-10 ENCOUNTER — Telehealth (HOSPITAL_COMMUNITY): Payer: Self-pay | Admitting: *Deleted

## 2020-09-10 ENCOUNTER — Encounter (HOSPITAL_COMMUNITY): Payer: Self-pay | Admitting: *Deleted

## 2020-09-10 ENCOUNTER — Encounter: Payer: Self-pay | Admitting: Student

## 2020-09-10 ENCOUNTER — Ambulatory Visit (INDEPENDENT_AMBULATORY_CARE_PROVIDER_SITE_OTHER): Payer: Medicare Other | Admitting: Student

## 2020-09-10 VITALS — BP 152/90 | HR 78 | Ht 68.0 in | Wt 268.6 lb

## 2020-09-10 DIAGNOSIS — I421 Obstructive hypertrophic cardiomyopathy: Secondary | ICD-10-CM

## 2020-09-10 DIAGNOSIS — I2510 Atherosclerotic heart disease of native coronary artery without angina pectoris: Secondary | ICD-10-CM

## 2020-09-10 DIAGNOSIS — I480 Paroxysmal atrial fibrillation: Secondary | ICD-10-CM | POA: Diagnosis not present

## 2020-09-10 DIAGNOSIS — G4733 Obstructive sleep apnea (adult) (pediatric): Secondary | ICD-10-CM

## 2020-09-10 DIAGNOSIS — R079 Chest pain, unspecified: Secondary | ICD-10-CM

## 2020-09-10 DIAGNOSIS — I10 Essential (primary) hypertension: Secondary | ICD-10-CM

## 2020-09-10 NOTE — Patient Instructions (Signed)
Medication Instructions:  Your physician recommends that you continue on your current medications as directed. Please refer to the Current Medication list given to you today.  *If you need a refill on your cardiac medications before your next appointment, please call your pharmacy*   Lab Work: None If you have labs (blood work) drawn today and your tests are completely normal, you will receive your results only by: MyChart Message (if you have MyChart) OR A paper copy in the mail If you have any lab test that is abnormal or we need to change your treatment, we will call you to review the results.   Testing/Procedures: Your physician has requested that you have a lexiscan myoview. For further information please visit [email or web address] Please follow instruction sheet, as given.   Follow-Up: At [facility], you and your health needs are our priority.  As part of our continuing mission to provide you with exceptional heart care, we have created designated Provider Care Teams.  These Care Teams include your primary Cardiologist (physician) and Advanced Practice Providers (APPs -  Physician Assistants and Nurse Practitioners) who all work together to provide you with the care you need, when you need it.    Your next appointment:   6 month(s)  The format for your next appointment:   In Person  Provider:   You may see Will Jorja Loa, MD or one of the following Advanced Practice Providers on your designated Care Team:   Francis Dowse, [place] Casimiro Needle "[facility]" Martin, [place]

## 2020-09-10 NOTE — Telephone Encounter (Signed)
Patient given detailed instructions per Myocardial Perfusion Study Information Sheet for the test on 09/16/20 at 1030. Patient notified to arrive 15 minutes early and that it is imperative to arrive on time for appointment to keep from having the test rescheduled.  If you need to cancel or reschedule your appointment, please call the office within 24 hours of your appointment. . Patient verbalized understanding.Kyle Peterson, Adelene Idler Mychart letter sent

## 2020-09-10 NOTE — Addendum Note (Signed)
Addended by: Maxine Glenn A on: 09/10/2020 10:37 AM   Modules accepted: Orders

## 2020-09-12 ENCOUNTER — Other Ambulatory Visit: Payer: Self-pay | Admitting: Cardiology

## 2020-09-14 ENCOUNTER — Telehealth: Payer: Self-pay | Admitting: *Deleted

## 2020-09-14 ENCOUNTER — Other Ambulatory Visit: Payer: Self-pay

## 2020-09-14 MED ORDER — LOSARTAN POTASSIUM 100 MG PO TABS: 100.0000 mg | ORAL_TABLET | Freq: Every day | ORAL | 3 refills | Status: DC

## 2020-09-14 MED ORDER — FUROSEMIDE 20 MG PO TABS: 20.0000 mg | ORAL_TABLET | Freq: Every day | ORAL | 3 refills | Status: DC

## 2020-09-14 MED ORDER — METOPROLOL SUCCINATE ER 50 MG PO TB24: ORAL_TABLET | ORAL | 3 refills | Status: DC

## 2020-09-14 MED ORDER — ATORVASTATIN CALCIUM 40 MG PO TABS: 40.0000 mg | ORAL_TABLET | Freq: Every day | ORAL | 3 refills | Status: DC

## 2020-09-14 MED ORDER — DILTIAZEM HCL ER BEADS 300 MG PO CP24: 300.0000 mg | ORAL_CAPSULE | Freq: Every day | ORAL | 3 refills | Status: DC

## 2020-09-14 NOTE — Telephone Encounter (Signed)
Refills have been sent in to [facility] [address] Per pt request.

## 2020-09-15 ENCOUNTER — Ambulatory Visit (INDEPENDENT_AMBULATORY_CARE_PROVIDER_SITE_OTHER): Payer: Medicare Other

## 2020-09-15 DIAGNOSIS — I421 Obstructive hypertrophic cardiomyopathy: Secondary | ICD-10-CM

## 2020-09-15 LAB — CUP PACEART REMOTE DEVICE CHECK
Battery Remaining Longevity: 132 mo
Battery Remaining Percentage: 100 %
Brady Statistic RV Percent Paced: 0 %
Date Time Interrogation Session: 20220816023000
HighPow Impedance: 67 Ohm
Implantable Lead Implant Date: 20160218
Implantable Lead Location: 753860
Implantable Lead Model: 292
Implantable Lead Serial Number: 358834
Implantable Pulse Generator Implant Date: 20160218
Lead Channel Impedance Value: 875 Ohm
Lead Channel Pacing Threshold Amplitude: 0.5 V
Lead Channel Pacing Threshold Pulse Width: 0.5 ms
Lead Channel Setting Pacing Amplitude: 2 V
Lead Channel Setting Pacing Pulse Width: 0.5 ms
Lead Channel Setting Sensing Sensitivity: 0.3 mV
Pulse Gen Serial Number: 202913

## 2020-09-16 ENCOUNTER — Other Ambulatory Visit: Payer: Self-pay

## 2020-09-16 ENCOUNTER — Ambulatory Visit (HOSPITAL_COMMUNITY): Payer: Medicare Other | Attending: Cardiovascular Disease

## 2020-09-16 DIAGNOSIS — R079 Chest pain, unspecified: Secondary | ICD-10-CM | POA: Diagnosis not present

## 2020-09-16 LAB — MYOCARDIAL PERFUSION IMAGING
LV dias vol: 156 mL (ref 62–150)
LV sys vol: 70 mL
Peak HR: 82 {beats}/min
Rest HR: 66 {beats}/min
SDS: 3
SRS: 1
SSS: 4
TID: 1.03

## 2020-09-16 MED ORDER — TECHNETIUM TC 99M TETROFOSMIN IV KIT
11.0000 | PACK | Freq: Once | INTRAVENOUS | Status: AC | PRN
  Administered 2020-09-16: 11 via INTRAVENOUS
  Filled 2020-09-16: qty 11

## 2020-09-16 MED ORDER — TECHNETIUM TC 99M TETROFOSMIN IV KIT
30.5000 | PACK | Freq: Once | INTRAVENOUS | Status: AC | PRN
  Administered 2020-09-16: 30.5 via INTRAVENOUS
  Filled 2020-09-16: qty 31

## 2020-09-16 MED ORDER — REGADENOSON 0.4 MG/5ML IV SOLN
0.4000 mg | Freq: Once | INTRAVENOUS | Status: AC
  Administered 2020-09-16: 0.4 mg via INTRAVENOUS

## 2020-10-04 NOTE — Progress Notes (Signed)
Remote ICD transmission.   

## 2020-12-15 ENCOUNTER — Ambulatory Visit (INDEPENDENT_AMBULATORY_CARE_PROVIDER_SITE_OTHER): Payer: Medicare Other

## 2020-12-15 DIAGNOSIS — I422 Other hypertrophic cardiomyopathy: Secondary | ICD-10-CM | POA: Diagnosis not present

## 2020-12-15 LAB — CUP PACEART REMOTE DEVICE CHECK
Battery Remaining Longevity: 138 mo
Battery Remaining Percentage: 100 %
Brady Statistic RV Percent Paced: 0 %
Date Time Interrogation Session: 20221115023200
HighPow Impedance: 67 Ohm
Implantable Lead Implant Date: 20160218
Implantable Lead Location: 753860
Implantable Lead Model: 292
Implantable Lead Serial Number: 358834
Implantable Pulse Generator Implant Date: 20160218
Lead Channel Impedance Value: 801 Ohm
Lead Channel Pacing Threshold Amplitude: 0.5 V
Lead Channel Pacing Threshold Pulse Width: 0.5 ms
Lead Channel Setting Pacing Amplitude: 2 V
Lead Channel Setting Pacing Pulse Width: 0.5 ms
Lead Channel Setting Sensing Sensitivity: 0.3 mV
Pulse Gen Serial Number: 202913

## 2020-12-23 NOTE — Progress Notes (Signed)
Remote ICD transmission.   

## 2021-03-16 ENCOUNTER — Ambulatory Visit (INDEPENDENT_AMBULATORY_CARE_PROVIDER_SITE_OTHER): Payer: Medicare Other

## 2021-03-16 DIAGNOSIS — I422 Other hypertrophic cardiomyopathy: Secondary | ICD-10-CM

## 2021-03-16 LAB — CUP PACEART REMOTE DEVICE CHECK
Battery Remaining Longevity: 138 mo
Battery Remaining Percentage: 100 %
Brady Statistic RV Percent Paced: 0 %
Date Time Interrogation Session: 20230214023100
HighPow Impedance: 70 Ohm
Implantable Lead Implant Date: 20160218
Implantable Lead Location: 753860
Implantable Lead Model: 292
Implantable Lead Serial Number: 358834
Implantable Pulse Generator Implant Date: 20160218
Lead Channel Impedance Value: 826 Ohm
Lead Channel Pacing Threshold Amplitude: 0.5 V
Lead Channel Pacing Threshold Pulse Width: 0.5 ms
Lead Channel Setting Pacing Amplitude: 2 V
Lead Channel Setting Pacing Pulse Width: 0.5 ms
Lead Channel Setting Sensing Sensitivity: 0.3 mV
Pulse Gen Serial Number: 202913

## 2021-03-22 NOTE — Progress Notes (Signed)
Remote ICD transmission.   

## 2021-03-27 ENCOUNTER — Other Ambulatory Visit: Payer: Self-pay | Admitting: Cardiology

## 2021-03-27 DIAGNOSIS — I480 Paroxysmal atrial fibrillation: Secondary | ICD-10-CM

## 2021-03-29 NOTE — Telephone Encounter (Signed)
Pt last saw Otilio Saber, [place] on 09/10/20, last labs 08/26/20 Creat 0.87, age 56, weight 121.6kg, CrCl 166.95, based on CrCl pt is on appropriate dosage of Xarelto 20mg  QD for afib.  Will refill rx.

## 2021-03-30 ENCOUNTER — Encounter: Payer: Medicare Other | Admitting: Cardiology

## 2021-06-15 ENCOUNTER — Ambulatory Visit (INDEPENDENT_AMBULATORY_CARE_PROVIDER_SITE_OTHER): Payer: Medicare Other

## 2021-06-15 DIAGNOSIS — I422 Other hypertrophic cardiomyopathy: Secondary | ICD-10-CM | POA: Diagnosis not present

## 2021-06-15 LAB — CUP PACEART REMOTE DEVICE CHECK
Battery Remaining Longevity: 138 mo
Battery Remaining Percentage: 100 %
Brady Statistic RV Percent Paced: 0 %
Date Time Interrogation Session: 20230516023100
HighPow Impedance: 68 Ohm
Implantable Lead Implant Date: 20160218
Implantable Lead Location: 753860
Implantable Lead Model: 292
Implantable Lead Serial Number: 358834
Implantable Pulse Generator Implant Date: 20160218
Lead Channel Impedance Value: 876 Ohm
Lead Channel Pacing Threshold Amplitude: 0.5 V
Lead Channel Pacing Threshold Pulse Width: 0.5 ms
Lead Channel Setting Pacing Amplitude: 2 V
Lead Channel Setting Pacing Pulse Width: 0.5 ms
Lead Channel Setting Sensing Sensitivity: 0.3 mV
Pulse Gen Serial Number: 202913

## 2021-07-01 ENCOUNTER — Encounter: Payer: Self-pay | Admitting: Cardiology

## 2021-07-01 ENCOUNTER — Ambulatory Visit (INDEPENDENT_AMBULATORY_CARE_PROVIDER_SITE_OTHER): Payer: Medicare Other | Admitting: Cardiology

## 2021-07-01 VITALS — BP 148/80 | HR 68 | Ht 68.0 in | Wt 267.0 lb

## 2021-07-01 DIAGNOSIS — D6869 Other thrombophilia: Secondary | ICD-10-CM

## 2021-07-01 DIAGNOSIS — I422 Other hypertrophic cardiomyopathy: Secondary | ICD-10-CM | POA: Diagnosis not present

## 2021-07-01 DIAGNOSIS — I480 Paroxysmal atrial fibrillation: Secondary | ICD-10-CM | POA: Diagnosis not present

## 2021-07-01 NOTE — Progress Notes (Signed)
Remote ICD transmission.   

## 2021-07-01 NOTE — Progress Notes (Signed)
Electrophysiology Office Note   Date:  07/01/2021   ID:  Kyle Peterson, DOB 1965-12-18, MRN [id number]  PCP:  Patient, No Pcp Per (Inactive) Primary Electrophysiologist:  Kyle Dade Jorja Loa, MD    No chief complaint on file.    History of Present Illness: Kyle Peterson is a 56 y.o. male who presents today for electrophysiology evaluation.     He has a history seen for hypertrophic cardiomyopathy with septal myectomy in 2016 at [facility].  He has a Environmental manager ICD, none obstructive coronary artery disease, hypertension, hyperlipidemia, paroxysmal atrial fibrillation, sleep apnea, obesity.  He has a history of polysubstance abuse including heroin cocaine and alcohol for 25 years but has been clean up to 14 years.  He had an episode of syncope prior to his myectomy.  He also had significant mitral regurgitation prior to his operation.  Today, denies symptoms of palpitations, chest pain, shortness of breath, orthopnea, PND, lower extremity edema, claudication, dizziness, presyncope, syncope, bleeding, or neurologic sequela. The patient is tolerating medications without difficulties.  Main complaint is back pain.  He was initially scheduled for surgery, but he was told that he should not stop his Xarelto.  He has had a TIA, but he does remain in sinus rhythm.  If it was not for his back pain, he [name] be able to exert himself without issue and climb stairs without issue.  He also needs some teeth pulled.  Past Medical History:  Diagnosis Date   AICD (automatic cardioverter/defibrillator) present    bostan scien   Anginal pain (HCC)    ?heart pain   per dr Estill Dooms   Anxiety    Arthritis    ddd   Ascending aortic aneurysm (HCC)    68mm by echo 2021   CAD (coronary artery disease), native coronary artery 08/19/2015   CHF (congestive heart failure) (HCC)    Depression    Diabetes mellitus without complication (HCC)    no meds  new dx   Heart murmur    High cholesterol     Hypertension    Myocardial infarction (HCC)    OSA (obstructive sleep apnea)    Severe with AHI 80/hr- new bipap   PAF (paroxysmal atrial fibrillation) (HCC) 08/19/2015   PTSD (post-traumatic stress disorder)    Stroke (HCC) 2016   Subjective visual disturbance 03/03/2020   Substance abuse (HCC)    SVT (supraventricular tachycardia) (HCC)    nonsustained up to 17 beats in a row   Past Surgical History:  Procedure Laterality Date   CARDIAC CATHETERIZATION     01/13/14 [facility]: LM NL, mild narrowing of proximal/mid LAD, mid LCX, proximal RCA. 30% ostial OM1. 40% distal RCA. Medical tx.    CARPAL TUNNEL RELEASE Left 11/02/2015   CERVICAL DISCECTOMY     MYOMECTOMY     SHOULDER ARTHROSCOPY WITH ROTATOR CUFF REPAIR Left 09/09/2016   Procedure: LEFT SHOULDER ARTHROSCOPY WITH MINI OPEN ROTATOR CUFF REPAIR;  Surgeon: Eldred Manges, MD;  Location: [facility] OR;  Service: Orthopedics;  Laterality: Left;   TONSILLECTOMY       Current Outpatient Medications  Medication Sig Dispense Refill   albuterol (VENTOLIN HFA) 108 (90 Base) MCG/ACT inhaler INHALE 1 TO 2 PUFFS BY MOUTH EVERY 6 HOURS AS NEEDED     atorvastatin (LIPITOR) 40 MG tablet Take 1 tablet (40 mg total) by mouth daily. 90 tablet 3   diltiazem (TIAZAC) 300 MG 24 hr capsule Take 1 capsule (300 mg total) by mouth daily. 90  capsule 3   furosemide (LASIX) 20 MG tablet Take 1 tablet (20 mg total) by mouth daily. 90 tablet 3   losartan (COZAAR) 100 MG tablet Take 1 tablet (100 mg total) by mouth daily. 90 tablet 3   metFORMIN (GLUCOPHAGE) 500 MG tablet Take 500 mg by mouth 2 (two) times daily with a meal.     metoprolol succinate (TOPROL-XL) 50 MG 24 hr tablet TAKE 1 TABLET BY MOUTH ONCE DAILY WITH A MEAL OR  IMMEDIATELY  FOLLOWING  A  MEAL 90 tablet 3   Multiple Vitamin (MULTIVITAMIN) capsule Take 1 capsule by mouth daily.     rivaroxaban (XARELTO) 20 MG TABS tablet Take 1 tablet by mouth once daily 90 tablet 1   No current  facility-administered medications for this visit.    Allergies:   Ace inhibitors   Social History:  The patient  reports that he has been smoking cigarettes. He has a 20.00 pack-year smoking history. He has never used smokeless tobacco. He reports current drug use. Frequency: 3.00 times per week. Drug: Marijuana. He reports that he does not drink alcohol.   Family History:  The patient's family history includes Alcohol abuse in his brother and father; Diabetes in his father and mother; Drug abuse in his brother; Heart disease in his father; Hypertension in his father and mother.   ROS:  Please see the history of present illness.   Otherwise, review of systems is positive for none.   All other systems are reviewed and negative.   PHYSICAL EXAM: VS:  BP (!) 148/80   Pulse 68   Ht 5\' 8"  (1.727 m)   Wt 267 lb (121.1 kg)   SpO2 96%   BMI 40.60 kg/m  , BMI Body mass index is 40.6 kg/m. GEN: Well nourished, well developed, in no acute distress  HEENT: normal  Neck: no JVD, carotid bruits, or masses Cardiac: RRR; no murmurs, rubs, or gallops,no edema  Respiratory:  clear to auscultation bilaterally, normal work of breathing GI: soft, nontender, nondistended, + BS MS: no deformity or atrophy  Skin: warm and dry, device site well healed Neuro:  Strength and sensation are intact Psych: euthymic mood, full affect  EKG:  EKG is ordered today. Personal review of the ekg ordered shows sinus rhythm, IVCD  Personal review of the device interrogation today. Results in [facility]   Recent Labs: 08/26/2020: BUN 8; Creatinine, Ser 0.87; Hemoglobin 16.8; Platelets 208; Potassium 3.6; Sodium 136    Lipid Panel     Component Value Date/Time   CHOL 126 02/24/2020 0741   TRIG 77 02/24/2020 0741   HDL 45 02/24/2020 0741   CHOLHDL 2.8 02/24/2020 0741   CHOLHDL 5.6 (H) 06/10/2015 0924   VLDL 40 (H) 06/10/2015 0924   LDLCALC 66 02/24/2020 0741     Wt Readings from Last 3 Encounters:  07/01/21 267  lb (121.1 kg)  09/16/20 268 lb (121.6 kg)  09/10/20 268 lb 9.6 oz (121.8 kg)      Other studies Reviewed: Additional studies/ records that were reviewed today include: 09/11/14 echocardiogram  Review of the above records today demonstrates:  EF 50-55%. Asymmetric LV septal hypertrophy. Basal anteroseptal 1.8 cm. Hypokinesis of the basal and mid anteroseptal and inferior wall. No significant obstruction of the LVOT. Peak gradient in the LVOT is 13 mmHg. Mild mitral regurgitation. Left atrial cavity is mildly dilated.   ASSESSMENT AND PLAN:  1.  Hypertrophic cardiomyopathy: Status [facility].  Is status post [place]  [employer]ific ICD.  Device functioning appropriately.  No changes at this time.  2.  Obstructive sleep apnea: Currently on BiPAP.  Encourage compliance.  3.  Paroxysmal atrial fibrillation: Currently on Xarelto 20 mg daily.  CHA2DS2-VASc of 2.  Remains in sinus rhythm.  No changes.  4.  Secondary hypercoagulable state: Currently on Xarelto for atrial fibrillation as above  5.  Preoperative evaluation: Potentially needs back surgery with lumbar fusion.  He is currently on Xarelto.  He has no exertional symptoms.  He would be able to stop his Xarelto prior to surgery with restarting it as soon as possible per the surgeon's recommendations.  He also has plans for potential tooth extraction.  He would be at intermediate risk for intermediate to high risk procedures.  I do not see any reason to hold off on scheduling.  Current medicines are reviewed at length with the patient today.   The patient does not have concerns regarding his medicines.  The following changes were made today:  none  Labs/ tests ordered today include:  Orders Placed This Encounter  Procedures   EKG 12-Lead     Disposition:   FU with Tenee Wish 6 months  Signed, Frankee Gritz Jorja Loa, MD  07/01/2021 2:52 PM     [facility] [address] [place]  [address] [phone] (office) [phone] (fax)

## 2021-09-14 ENCOUNTER — Ambulatory Visit (INDEPENDENT_AMBULATORY_CARE_PROVIDER_SITE_OTHER): Payer: Medicare Other

## 2021-09-14 DIAGNOSIS — I422 Other hypertrophic cardiomyopathy: Secondary | ICD-10-CM | POA: Diagnosis not present

## 2021-09-14 LAB — CUP PACEART REMOTE DEVICE CHECK
Battery Remaining Longevity: 132 mo
Battery Remaining Percentage: 100 %
Brady Statistic RV Percent Paced: 0 %
Date Time Interrogation Session: 20230815023200
HighPow Impedance: 68 Ohm
Implantable Lead Implant Date: 20160218
Implantable Lead Location: 753860
Implantable Lead Model: 292
Implantable Lead Serial Number: 358834
Implantable Pulse Generator Implant Date: 20160218
Lead Channel Impedance Value: 877 Ohm
Lead Channel Pacing Threshold Amplitude: 0.5 V
Lead Channel Pacing Threshold Pulse Width: 0.5 ms
Lead Channel Setting Pacing Amplitude: 2 V
Lead Channel Setting Pacing Pulse Width: 0.5 ms
Lead Channel Setting Sensing Sensitivity: 0.3 mV
Pulse Gen Serial Number: 202913

## 2021-09-15 ENCOUNTER — Other Ambulatory Visit: Payer: Self-pay | Admitting: Cardiology

## 2021-09-20 ENCOUNTER — Other Ambulatory Visit: Payer: Self-pay | Admitting: Cardiology

## 2021-09-20 DIAGNOSIS — I480 Paroxysmal atrial fibrillation: Secondary | ICD-10-CM

## 2021-09-20 NOTE — Telephone Encounter (Signed)
Prescription refill request for Xarelto received.  Indication: PAF Last office visit: 07/01/21  Carleene Mains MD Weight: 121.1kg Age: 56 Scr: 1.1 on 08/02/21 CrCl: 129.97  Based on above findings Xarelto 20mg  daily is the appropriate dose.  Refill approved.

## 2021-10-07 ENCOUNTER — Other Ambulatory Visit: Payer: Self-pay | Admitting: Cardiology

## 2021-10-13 ENCOUNTER — Telehealth: Payer: Self-pay | Admitting: Cardiology

## 2021-10-13 MED ORDER — METOPROLOL SUCCINATE ER 50 MG PO TB24: ORAL_TABLET | ORAL | 3 refills | Status: DC

## 2021-10-13 NOTE — Telephone Encounter (Signed)
*  STAT* If patient is at the pharmacy, call can be transferred to refill team.   1. Which medications need to be refilled? (please list name of each medication and dose if known) losartan (COZAAR) 100 MG tablet and metoprolol succinate (TOPROL-XL) 50 MG 24 hr tablet  2. Which pharmacy/location (including street and city if local pharmacy) is medication to be sent to? [facility] [address], [place] - [address]  3. Do they need a 30 day or 90 day supply? 90  Patient scheduled for appt with Dr. Mayford Knife in January

## 2021-10-14 ENCOUNTER — Other Ambulatory Visit: Payer: Self-pay | Admitting: Cardiology

## 2021-10-14 ENCOUNTER — Telehealth: Payer: Self-pay | Admitting: Cardiology

## 2021-10-14 MED ORDER — LOSARTAN POTASSIUM 100 MG PO TABS: 100.0000 mg | ORAL_TABLET | Freq: Every day | ORAL | 1 refills | Status: DC

## 2021-10-14 NOTE — Telephone Encounter (Signed)
Pt's medication was sent to pt's pharmacy as requested. Confirmation received.  °

## 2021-10-14 NOTE — Telephone Encounter (Signed)
*  STAT* If patient is at the pharmacy, call can be transferred to refill team.   1. Which medications need to be refilled? (please list name of each medication and dose if known)   losartan (COZAAR) 100 MG tablet    2. Which pharmacy/location (including street and city if local pharmacy) is medication to be sent to? [facility] [address] [place] - [address]  3. Do they need a 30 day or 90 day supply?  90 day   Pt has scheduled appt on 02/21/22. Pease advise

## 2021-10-15 NOTE — Progress Notes (Signed)
Remote ICD transmission.   

## 2021-12-14 ENCOUNTER — Ambulatory Visit (INDEPENDENT_AMBULATORY_CARE_PROVIDER_SITE_OTHER): Payer: Medicare Other

## 2021-12-14 DIAGNOSIS — I421 Obstructive hypertrophic cardiomyopathy: Secondary | ICD-10-CM

## 2021-12-14 LAB — CUP PACEART REMOTE DEVICE CHECK
Battery Remaining Longevity: 126 mo
Battery Remaining Percentage: 100 %
Brady Statistic RV Percent Paced: 0 %
Date Time Interrogation Session: 20231114023100
HighPow Impedance: 61 Ohm
Implantable Lead Connection Status: 753985
Implantable Lead Implant Date: 20160218
Implantable Lead Location: 753860
Implantable Lead Model: 292
Implantable Lead Serial Number: 358834
Implantable Pulse Generator Implant Date: 20160218
Lead Channel Impedance Value: 844 Ohm
Lead Channel Pacing Threshold Amplitude: 0.5 V
Lead Channel Pacing Threshold Pulse Width: 0.5 ms
Lead Channel Setting Pacing Amplitude: 2 V
Lead Channel Setting Pacing Pulse Width: 0.5 ms
Lead Channel Setting Sensing Sensitivity: 0.3 mV
Pulse Gen Serial Number: 202913
Zone Setting Status: 755011

## 2022-01-10 NOTE — Progress Notes (Signed)
Remote ICD transmission.   

## 2022-02-15 ENCOUNTER — Other Ambulatory Visit: Payer: Self-pay | Admitting: Neurology

## 2022-02-15 DIAGNOSIS — G935 Compression of brain: Secondary | ICD-10-CM

## 2022-02-21 ENCOUNTER — Ambulatory Visit: Payer: 59 | Admitting: Cardiology

## 2022-03-15 ENCOUNTER — Ambulatory Visit: Payer: 59

## 2022-03-15 DIAGNOSIS — I421 Obstructive hypertrophic cardiomyopathy: Secondary | ICD-10-CM | POA: Diagnosis not present

## 2022-03-15 LAB — CUP PACEART REMOTE DEVICE CHECK
Battery Remaining Longevity: 126 mo
Battery Remaining Percentage: 100 %
Brady Statistic RV Percent Paced: 0 %
Date Time Interrogation Session: 20240213023100
HighPow Impedance: 71 Ohm
Implantable Lead Connection Status: 753985
Implantable Lead Implant Date: 20160218
Implantable Lead Location: 753860
Implantable Lead Model: 292
Implantable Lead Serial Number: 358834
Implantable Pulse Generator Implant Date: 20160218
Lead Channel Impedance Value: 819 Ohm
Lead Channel Pacing Threshold Amplitude: 0.5 V
Lead Channel Pacing Threshold Pulse Width: 0.5 ms
Lead Channel Setting Pacing Amplitude: 2 V
Lead Channel Setting Pacing Pulse Width: 0.5 ms
Lead Channel Setting Sensing Sensitivity: 0.3 mV
Pulse Gen Serial Number: 202913
Zone Setting Status: 755011

## 2022-03-21 ENCOUNTER — Encounter: Payer: Self-pay | Admitting: *Deleted

## 2022-03-21 ENCOUNTER — Encounter: Payer: 59 | Attending: Family Medicine | Admitting: *Deleted

## 2022-03-21 VITALS — BP 140/88 | Ht 68.0 in | Wt 264.9 lb

## 2022-03-21 DIAGNOSIS — Z6841 Body Mass Index (BMI) 40.0 and over, adult: Secondary | ICD-10-CM | POA: Diagnosis not present

## 2022-03-21 DIAGNOSIS — E119 Type 2 diabetes mellitus without complications: Secondary | ICD-10-CM | POA: Diagnosis present

## 2022-03-21 DIAGNOSIS — Z713 Dietary counseling and surveillance: Secondary | ICD-10-CM | POA: Diagnosis not present

## 2022-03-21 NOTE — Progress Notes (Signed)
Diabetes Self-Management Education  Visit Type: First/Initial  Appt. Start Time: 1030 Appt. End Time: [id number]  03/21/2022  [name], identified by name and date of birth, is a 57 y.o. male with a diagnosis of Diabetes: Type 2.   ASSESSMENT  Blood pressure (!) 140/88, height 5' 8"$  (1.727 m), weight 264 lb 14.4 oz (120.2 kg). Body mass index is 40.28 kg/m.   Diabetes Self-Management Education - 03/21/22 1551       Visit Information   Visit Type First/Initial      Initial Visit   Diabetes Type Type 2    Date Diagnosed 4-5 years    Are you currently following a meal plan? Yes    What type of meal plan do you follow? "stopped drinking sodas, cut back on sweets"    Are you taking your medications as prescribed? Yes      Health Coping   How would you rate your overall health? Poor      Psychosocial Assessment   Patient Belief/Attitude about Diabetes Other (comment)   "not good"   What is the hardest part about your diabetes right now, causing you the most concern, or is the most worrisome to you about your diabetes?   Making healty food and beverage choices    Self-care barriers None    Self-management support Doctor's office    Patient Concerns Nutrition/Meal planning;Glycemic Control;Weight Control;Monitoring;Healthy Lifestyle    Special Needs None    Preferred Learning Style Auditory;Hands on;Other (comment)   talking/discussion   Learning Readiness Ready    How often do you need to have someone help you when you read instructions, pamphlets, or other written materials from your doctor or pharmacy? 1 - Never    What is the last grade level you completed in school? 9th      Pre-Education Assessment   Patient understands the diabetes disease and treatment process. Needs Instruction    Patient understands incorporating nutritional management into lifestyle. Needs Instruction    Patient undertands incorporating physical activity into lifestyle. Needs Instruction    Patient  understands using medications safely. Needs Instruction    Patient understands monitoring blood glucose, interpreting and using results Needs Instruction    Patient understands prevention, detection, and treatment of acute complications. Needs Instruction    Patient understands prevention, detection, and treatment of chronic complications. Needs Instruction    Patient understands how to develop strategies to address psychosocial issues. Needs Instruction    Patient understands how to develop strategies to promote health/change behavior. Needs Instruction      Complications   Last HgB A1C per patient/outside source 7.1 %   02/03/2022   How often do you check your blood sugar? Patient declines    Have you had a dilated eye exam in the past 12 months? Yes    Have you had a dental exam in the past 12 months? Yes    Are you checking your feet? Yes    How many days per week are you checking your feet? 3      Dietary Intake   Breakfast skips    Lunch hamburger, pizza, chicken sandwich, chicken nuggets or tenders, fries, soup    Dinner sometimes just eats protein - chicken, beef, pork, fish; potatoes, peas, beans, corn, rice, pasta, broccoli, zucchini, squash, carrots, tomatoes    Beverage(s) water, juice, 1-2 pots of coffee with creamer, Gatorade zero      Activity / Exercise   Activity / Exercise Type ADL's  Patient Education   Disease Pathophysiology Definition of diabetes, type 1 and 2, and the diagnosis of diabetes;Factors that contribute to the development of diabetes;Explored patient's options for treatment of their diabetes    Healthy Eating Role of diet in the treatment of diabetes and the relationship between the three main macronutrients and blood glucose level;Food label reading, portion sizes and measuring food.;Reviewed blood glucose goals for pre and post meals and how to evaluate the patients' food intake on their blood glucose level.;Meal timing in regards to the patients'  current diabetes medication.    Being Active Helped patient identify appropriate exercises in relation to his/her diabetes, diabetes complications and other health issue.    Medications Reviewed patients medication for diabetes, action, purpose, timing of dose and side effects.    Monitoring Identified appropriate SMBG and/or A1C goals.    Chronic complications Relationship between chronic complications and blood glucose control    Diabetes Stress and Support Identified and addressed patients feelings and concerns about diabetes;Role of stress on diabetes    Lifestyle and Health Coping Review risk of smoking and offered smoking cessation      Individualized Goals (developed by patient)   Reducing Risk Other (comment)   improve blood sugars, prevent diabetes complications, lose weight, lead a healthier lifestyle, quit smoking, become more fit     Outcomes   Expected Outcomes Demonstrated interest in learning but significant barriers to change    Future DMSE 4-6 wks         Individualized Plan for Diabetes Self-Management Training:   Learning Objective:  Patient will have a greater understanding of diabetes self-management. Patient education plan is to attend individual and/or group sessions per assessed needs and concerns.   Plan:   Patient Instructions  Exercise:  Walk as tolerated  Eat 3 meals day,   1-2  snacks a day Space meals 4-6 hours apart Don't skip meals - eat 1 protein and 1 carbohydrate serving Avoid sugar sweetened drinks (juices) Increase water to 4-6 servings per day Decrease amount of creamer used in your coffee  Complete 3 Day Food Record and bring to next appt  Quit smoking  Return for appointment on:  Friday April 29, 2022 at 10:45 am with [facility] (dietitian)  Expected Outcomes:  Demonstrated interest in learning but significant barriers to change  Education material provided:  General Meal Planning Guidelines Simple Meal Plan 3 Day Food Record  If  problems or questions, patient to contact team via:   Johny Drilling, RN, [place] [phone]  Future DSME appointment: 4-6 wks April 29, 2022 with the dietitian

## 2022-03-21 NOTE — Patient Instructions (Addendum)
Exercise:  Walk as tolerated  Eat 3 meals day,   1-2  snacks a day Space meals 4-6 hours apart Don't skip meals - eat 1 protein and 1 carbohydrate serving Avoid sugar sweetened drinks (juices) Increase water to 4-6 servings per day Decrease amount of creamer used in your coffee  Complete 3 Day Food Record and bring to next appt  Quit smoking  Return for appointment on:  Friday April 29, 2022 at 10:45 am with [facility] (dietitian)

## 2022-03-22 ENCOUNTER — Other Ambulatory Visit: Payer: Self-pay | Admitting: Cardiology

## 2022-03-22 DIAGNOSIS — I480 Paroxysmal atrial fibrillation: Secondary | ICD-10-CM

## 2022-03-22 NOTE — Telephone Encounter (Signed)
Prescription refill request for Xarelto received.   Indication: afib  Last office visit: [place], 07/01/2021 Weight: 120.2 kg Age: 57 yo  Scr: 1.1, 02/03/2022 CrCl: 129 ml/min   Refill sent.

## 2022-04-05 ENCOUNTER — Other Ambulatory Visit: Payer: Self-pay | Admitting: Cardiology

## 2022-04-15 NOTE — Progress Notes (Signed)
Remote ICD transmission.   

## 2022-04-19 ENCOUNTER — Ambulatory Visit (HOSPITAL_COMMUNITY)
Admission: RE | Admit: 2022-04-19 | Discharge: 2022-04-19 | Disposition: A | Discharge status: Home / Self Care | Payer: 59 | Source: Ambulatory Visit | Attending: Neurology | Admitting: Neurology

## 2022-04-19 DIAGNOSIS — G935 Compression of brain: Secondary | ICD-10-CM | POA: Insufficient documentation

## 2022-04-19 MED ORDER — GADOBUTROL 1 MMOL/ML IV SOLN
10.0000 mL | Freq: Once | INTRAVENOUS | Status: AC | PRN
  Administered 2022-04-19: 10 mL via INTRAVENOUS

## 2022-04-19 NOTE — Progress Notes (Signed)
Per device rep(Joey) and cardiology PA, programed patient's ICD off/MRI safe mode for scan. Will monitor patient during the scan and will program back to regular setting after scan and review with device rep.

## 2022-04-29 ENCOUNTER — Ambulatory Visit: Payer: 59 | Admitting: Dietician

## 2022-05-25 ENCOUNTER — Encounter: Payer: Self-pay | Admitting: Dietician

## 2022-05-25 NOTE — Progress Notes (Signed)
Have not heard back from patient to reschedule his missed appointment from 04/29/22. Sent notification to referring provider.

## 2022-06-14 ENCOUNTER — Ambulatory Visit (INDEPENDENT_AMBULATORY_CARE_PROVIDER_SITE_OTHER): Payer: 59

## 2022-06-14 DIAGNOSIS — I421 Obstructive hypertrophic cardiomyopathy: Secondary | ICD-10-CM

## 2022-06-14 LAB — CUP PACEART REMOTE DEVICE CHECK
Battery Remaining Longevity: 126 mo
Battery Remaining Percentage: 100 %
Brady Statistic RV Percent Paced: 0 %
Date Time Interrogation Session: 20240514023100
HighPow Impedance: 71 Ohm
Implantable Lead Connection Status: 753985
Implantable Lead Implant Date: 20160218
Implantable Lead Location: 753860
Implantable Lead Model: 292
Implantable Lead Serial Number: 358834
Implantable Pulse Generator Implant Date: 20160218
Lead Channel Impedance Value: 852 Ohm
Lead Channel Pacing Threshold Amplitude: 0.6 V
Lead Channel Pacing Threshold Pulse Width: 0.5 ms
Lead Channel Setting Pacing Amplitude: 2 V
Lead Channel Setting Pacing Pulse Width: 0.5 ms
Lead Channel Setting Sensing Sensitivity: 0.3 mV
Pulse Gen Serial Number: 202913
Zone Setting Status: 755011

## 2022-06-15 ENCOUNTER — Other Ambulatory Visit: Payer: Self-pay | Admitting: Cardiology

## 2022-06-15 DIAGNOSIS — I480 Paroxysmal atrial fibrillation: Secondary | ICD-10-CM

## 2022-06-15 NOTE — Telephone Encounter (Signed)
Prescription refill request for Xarelto received.  Indication: Afib  Last office visit: 07/01/21 ([place])  Weight: 120.2kg Age: 57 Scr: 1.1 (02/03/22)  CrCl: 119ml/min  Appropriate dose. Refill sent.

## 2022-06-20 ENCOUNTER — Other Ambulatory Visit: Payer: Self-pay | Admitting: Physical Medicine & Rehabilitation

## 2022-06-20 DIAGNOSIS — M5416 Radiculopathy, lumbar region: Secondary | ICD-10-CM

## 2022-07-01 NOTE — Progress Notes (Signed)
Remote ICD transmission.   

## 2022-09-07 ENCOUNTER — Encounter (HOSPITAL_COMMUNITY): Payer: Self-pay

## 2022-09-07 ENCOUNTER — Ambulatory Visit (HOSPITAL_COMMUNITY): Admission: RE | Admit: 2022-09-07 | Payer: 59 | Source: Ambulatory Visit

## 2022-09-08 ENCOUNTER — Other Ambulatory Visit: Payer: Self-pay | Admitting: Cardiology

## 2022-09-08 DIAGNOSIS — I480 Paroxysmal atrial fibrillation: Secondary | ICD-10-CM

## 2022-09-08 NOTE — Telephone Encounter (Signed)
Prescription request received for Xarelto. Pt seen by cardiology at [facility]. Kyle Peterson

## 2022-09-13 ENCOUNTER — Ambulatory Visit (INDEPENDENT_AMBULATORY_CARE_PROVIDER_SITE_OTHER): Payer: 59

## 2022-09-13 ENCOUNTER — Other Ambulatory Visit: Payer: Self-pay | Admitting: Cardiology

## 2022-09-13 DIAGNOSIS — I421 Obstructive hypertrophic cardiomyopathy: Secondary | ICD-10-CM | POA: Diagnosis not present

## 2022-09-13 LAB — CUP PACEART REMOTE DEVICE CHECK
Battery Remaining Longevity: 120 mo
Battery Remaining Percentage: 100 %
Brady Statistic RV Percent Paced: 0 %
Date Time Interrogation Session: 20240813023100
HighPow Impedance: 67 Ohm
Implantable Lead Connection Status: 753985
Implantable Lead Implant Date: 20160218
Implantable Lead Location: 753860
Implantable Lead Model: 292
Implantable Lead Serial Number: 358834
Implantable Pulse Generator Implant Date: 20160218
Lead Channel Impedance Value: 809 Ohm
Lead Channel Pacing Threshold Amplitude: 0.6 V
Lead Channel Pacing Threshold Pulse Width: 0.5 ms
Lead Channel Setting Pacing Amplitude: 2 V
Lead Channel Setting Pacing Pulse Width: 0.5 ms
Lead Channel Setting Sensing Sensitivity: 0.3 mV
Pulse Gen Serial Number: 202913
Zone Setting Status: 755011

## 2022-09-28 ENCOUNTER — Other Ambulatory Visit: Payer: Self-pay | Admitting: Cardiology

## 2022-09-28 NOTE — Progress Notes (Signed)
Remote ICD transmission.   

## 2022-10-04 ENCOUNTER — Other Ambulatory Visit: Payer: Self-pay | Admitting: Cardiology

## 2022-10-04 DIAGNOSIS — I480 Paroxysmal atrial fibrillation: Secondary | ICD-10-CM

## 2022-10-04 NOTE — Telephone Encounter (Signed)
Xarelto 20mg  refill request received. Pt is 57 years old, weight-120.2kg, Crea-1.1 on 02/03/22 via [facility] from [facility], last seen by Dr. Elberta Fortis on 07/01/21 & multiple visits at [facility], Diagnosis-Afib, CrCl-127.48 mL/min; Dose is appropriate based on dosing criteria.   Will need to have Pharmacy send refill to new Cardiologist with [facility] as the 07/13/22 visit states from [facility]- "The patient was previously followed by Dr. Elberta Fortis, wishes to transfer his cardiovascular care."   Spoke with the pharmacy and advised that the refill needs to be sent to Dr. Marcina Millard and she states she will send it now and was appreciate of this since pt did not update her with details.

## 2022-10-05 ENCOUNTER — Other Ambulatory Visit: Payer: Self-pay | Admitting: Cardiology

## 2022-10-10 ENCOUNTER — Other Ambulatory Visit: Payer: Self-pay | Admitting: Cardiology

## 2022-12-13 ENCOUNTER — Ambulatory Visit: Payer: 59

## 2023-01-30 ENCOUNTER — Ambulatory Visit: Payer: 59 | Attending: Neurology

## 2023-01-30 DIAGNOSIS — R4189 Other symptoms and signs involving cognitive functions and awareness: Secondary | ICD-10-CM | POA: Diagnosis present

## 2023-01-30 DIAGNOSIS — G3184 Mild cognitive impairment, so stated: Secondary | ICD-10-CM | POA: Insufficient documentation

## 2023-01-30 DIAGNOSIS — R413 Other amnesia: Secondary | ICD-10-CM | POA: Diagnosis present

## 2023-01-30 DIAGNOSIS — R41841 Cognitive communication deficit: Secondary | ICD-10-CM | POA: Diagnosis present

## 2023-01-30 DIAGNOSIS — I2510 Atherosclerotic heart disease of native coronary artery without angina pectoris: Secondary | ICD-10-CM | POA: Insufficient documentation

## 2023-01-30 DIAGNOSIS — F250 Schizoaffective disorder, bipolar type: Secondary | ICD-10-CM | POA: Diagnosis not present

## 2023-01-30 DIAGNOSIS — E538 Deficiency of other specified B group vitamins: Secondary | ICD-10-CM | POA: Insufficient documentation

## 2023-01-30 DIAGNOSIS — M5030 Other cervical disc degeneration, unspecified cervical region: Secondary | ICD-10-CM | POA: Insufficient documentation

## 2023-01-30 DIAGNOSIS — M51369 Other intervertebral disc degeneration, lumbar region without mention of lumbar back pain or lower extremity pain: Secondary | ICD-10-CM | POA: Diagnosis not present

## 2023-01-30 NOTE — Therapy (Addendum)
OUTPATIENT SPEECH LANGUAGE PATHOLOGY  EVALUATION   Patient Name: Kyle Peterson MRN: [id number] DOB:08-06-1965, 57 y.o., male Today's Date: 01/30/2023  PCP: none listed REFERRING PROVIDER: Dr. Sherryll Burger   End of Session - 01/30/23 1500     Visit Number 1    Number of Visits 24    Date for SLP Re-Evaluation 04/24/23    Progress Note Due on Visit 10    SLP Start Time 1415    SLP Stop Time  1510    SLP Time Calculation (min) 55 min             No past medical history on file.  Patient Active Problem List   Diagnosis Date Noted   SVT (supraventricular tachycardia) (HCC)    Subjective visual disturbance 03/03/2020   Mitral regurgitation 08/15/2019   Long term (current) use of anticoagulants 08/15/2019   Cardiac defibrillator in place 08/15/2019   Unsteadiness on feet 10/25/2018   Ascending aortic aneurysm (HCC)    Chronic bilateral low back pain 12/13/2017   Posterior tibial tendonitis, right 12/13/2017   Class 3 severe obesity due to excess calories with serious comorbidity and body mass index (BMI) of 40.0 to 44.9 in adult (HCC) 12/13/2017   Chest mass 05/30/2017   S/P left rotator cuff repair 10/25/2016   Severe obesity (BMI >= 40) (HCC) 09/27/2016   Dilated aortic root (HCC) 07/20/2016   Claudication (HCC) 07/20/2016   Heart murmur 07/20/2016   Trigger finger, left middle finger 06/21/2016   Diabetes (HCC) 05/24/2016   Depression, major, recurrent, moderate (HCC) 11/05/2015   Chronic post-traumatic stress disorder (PTSD) 11/05/2015    Class: Chronic   OSA (obstructive sleep apnea)    Persistent atrial fibrillation (HCC) 08/19/2015   CAD (coronary artery disease), native coronary artery 08/19/2015   Shoulder pain 06/11/2015   Hyperlipidemia 06/10/2015   Tobacco use disorder 05/28/2015   Essential hypertension 05/27/2015   Bipolar disorder (HCC) 05/27/2015   Hypertrophic cardiomyopathy (HCC) 05/27/2015   Atrial flutter (HCC) 02/20/2014   Fluid overload 02/05/2014    Atelectasis 02/04/2014   Schizoaffective disorder (HCC) 11/27/2013   S/P ventricular septal myectomy 02/13/2013    ONSET DATE: 01/16/23 (referral date); reports difficulty since 2022  REFERRING DIAG: other symptoms and signs involving cognitive functions and awareness R41.89  THERAPY DIAG:  Cognitive-communication deficit  Rationale for Evaluation and Treatment Rehabilitation  SUBJECTIVE:   SUBJECTIVE STATEMENT: Pt alert, pleasant, and cooperative. Verbose, at times, but easily redirected.  Pt accompanied by: self  PERTINENT HISTORY: Pt is a 57 y.o. male who presents for cognitive-communication evaluation in setting of mild cognitive impaired (onset 2022). Pt with PMHx schizoaffective disorder and Bipolar I, Vit B12 deficiency, cervical and lumbar DDD (being followed by Neurosurgery), CAD, Pt on disability. Pt with complaints of attention, memory, and wordfinding difficulty.  DIAGNOSTIC FINDINGS: MRI brain, 04/12/22, "No acute intracranial abnormality. Essentially normal for age MRI appearance of the Brain."  PAIN:  Are you having pain? Yes, back pain; chronic; 6/10  FALLS: Has patient fallen in last 6 months?  No  LIVING ENVIRONMENT: Lives with: lives alone Lives in: House/apartment  PLOF:  Level of assistance: Independent with ADLs    PATIENT GOALS   to improve attention and memory  OBJECTIVE:   COGNITIVE COMMUNICATION: Overall cognitive status: Impaired Areas of impairment:  Attention: Impaired: Sustained, Selective, Divided Memory: Impaired: Immediate Short term Prospective Functional deficits: losing track of thoughts in conversation, having trouble recall names of people he just met, leaving items behind, difficultly recalling  a specific word, forgetting what he's supposed to do, misplacing item  AUDITORY COMPREHENSION: Overall auditory comprehension: WFL   READING COMPREHENSION: WFL  EXPRESSION: verbal  VERBAL EXPRESSION: Level of  generative/spontaneous verbalization: conversation Automatic speech: name: intact and social response: intact  Repetition: Appears intact Naming: Divergent: 9 "p" words in 60s Functional deficits: reports anomia in conversation Pragmatics:  verbose at times, redirectable Interfering components: attention  WRITTEN EXPRESSION: Dominant hand: right   Written expression: Appears intact  MOTOR SPEECH: Overall motor speech: Appears intact   ORAL MOTOR EXAMINATION: Appeared WFL  STANDARDIZED ASSESSMENTS: Addenbrooke's Cognitive Examination - ACE III The Addenbrooke's Cognitive Examination-III (ACE-III) is a brief cognitive test that assesses five cognitive domains. The total score is 100 with higher scores indicating better cognitive functioning. Cut off scores of 88 and 82 are recommended for suspicion of dementia (88 has sensitivity of 1.00 and specificity of 0.96, 82 has sensitivity of 0.93 and specificity of 1.00). American Version C  Attention 17/18  Memory 23/26  Fluency 11/14  Language 26/26  Visuospatial 16/16  TOTAL ACE- III Score 93/100     PATIENT REPORTED OUTCOME MEASURES (PROM):  MULTIFACTORIAL MEMORY QUESTIONNAIRE (MMQ)  Administered patient self-reported outcome measure Multifactorial Memory Questionnaire (MMQ). The Multifactorial Memory Questionnaire [facility]) consists of three scales measuring separate aspects of metamemory; Satisfaction, Ability and Strategy.   Pt's responses are converted to T-Scores with severity levels based on pt's T-Score.   Severity Levels (T-score) Very Low - < 20 Low - 20 to 29 Below Average - 30-39 Average - 40 to 60 Above Average - 60 to 70 High - 71 to 80 Very High - > 80  Pt reports:  Low - 20 to 29 Memory Satisfaction (T-score: 24) Average - 40 to 60 Memory Ability (T-score: 42) Below Average - 30-39 use of Memory Strategies (T-score: 37)      TODAY'S TREATMENT:  Education completed re: role of SLP, rationale for  assessment, importance of PROM, results of assessment, domains of cognition, foundational nature of attention for all domains of cognition, factors that may affect cognition, and SLP POC. Reinforcement of content needed particularly re: domains of cognition, foundational nature of attention, and factors that may affect cognition.    PATIENT EDUCATION: Education details: as above Person educated: Patient Education method: Explanation Education comprehension: verbalized understanding and needs further education  HOME EXERCISE PROGRAM:        To be given, as needed, in upcoming sessions    GOALS:  Goals reviewed with patient? Yes  SHORT TERM GOALS: Target date: 10 sessions  With Min A, patient will use strategies to maintain attention to linguistic activities of interest / iADLs. Baseline: Goal status: INITIAL   2.  With Min A, pt will verbalize and demonstrate how to implement x3 memory and attention strategies to aid daily functioning over 2 sessions.  Baseline:  Goal status: INITIAL  3.  Pt will verbalize at least x3 external factors that may affect cognitive-linguistic functioning.  Baseline:  Goal status: INITIAL  4.  Pt will utilize compensatory strategies for anomia during structured tasks with min A.  Baseline:  Goal status: INITIAL    LONG TERM GOALS: Target date: 12 weeks  Patient will demonstrate knowledge of appropriate activities to support cognitive and  language function outside of ST. Baseline:  Goal status: INITIAL  2.  Pt will utilize compensatory strategies for anomia with modified independence to repair communication breakdowns.  Baseline:  Goal status: INITIAL  3.  Pt will report  subjective improvement in memory compensation use per PROM. Baseline: MMQ - Use of Memory Strategies Below Average - 30-39 use of Memory Strategies (T-score: 37) Goal status: INITIAL   ASSESSMENT:  CLINICAL IMPRESSION:  Pt is a 57 y.o. male who presents for  cognitive-communication evaluation in setting of mild cognitive impaired (onset 2022). Pt with PMHx schizoaffective disorder and Bipolar I, Vit B12 deficiency, cervical and lumbar DDD (being followed by Neurosurgery), CAD. Pt on disability. Pt with complaints of attention, memory, and wordfinding difficulty. Assessment completed via informal means, Addenbrooke's Cognitive Examination (ACE III), and PROM (Multifactorial Memory Questionnaire). Pt presents with cognitive-linguistic deficits affecting attention, memory, problem solving/executive functioning, and verbal expression. Speech is largely fluent. No overt wordfinding difficulty observed conversationally, but reduced generative naming noted. Suspect pt's attentional deficits affecting all domains of cognition. Recommend course of ST targeting above mentioned deficits for implementation of compensations and education re: cognitive-linguistic functioning.   OBJECTIVE IMPAIRMENTS include attention, memory, executive functioning, and expressive language. These impairments are limiting patient from household responsibilities, ADLs/IADLs, and effectively communicating at home and in community. Factors affecting potential to achieve goals and functional outcome are co-morbidities.. Patient will benefit from skilled SLP services to address above impairments and improve overall function.  REHAB POTENTIAL: Good  PLAN: SLP FREQUENCY: 1-2x/week  SLP DURATION: 12 weeks  PLANNED INTERVENTIONS: Environmental controls, Cognitive reorganization, Internal/external aids, Functional tasks, SLP instruction and feedback, Compensatory strategies, and Patient/family education    Clyde Canterbury, M.S., CCC-SLP Speech-Language Pathologist [facility] [phone] Arnette Felts)  [facility] Outpatient Rehabilitation at [facility] [address] [place], [place], [address] Phone: [phone]   Fax:  [phone]

## 2023-02-02 ENCOUNTER — Ambulatory Visit: Payer: 59 | Attending: Neurology

## 2023-02-02 DIAGNOSIS — R42 Dizziness and giddiness: Secondary | ICD-10-CM | POA: Insufficient documentation

## 2023-02-02 DIAGNOSIS — R41841 Cognitive communication deficit: Secondary | ICD-10-CM | POA: Insufficient documentation

## 2023-02-02 DIAGNOSIS — G3184 Mild cognitive impairment, so stated: Secondary | ICD-10-CM | POA: Diagnosis present

## 2023-02-02 NOTE — Therapy (Signed)
 OUTPATIENT SPEECH LANGUAGE PATHOLOGY  TREATMENT   Patient Name: Kyle Peterson MRN: [id number] DOB:[birth year], 58 y.o., male Today's Date: 02/02/2023  PCP: none listed REFERRING PROVIDER: Dr. Maree   End of Session - 02/02/23 1352     Visit Number 2    Number of Visits 24    Date for SLP Re-Evaluation 04/24/23    Progress Note Due on Visit 10    SLP Start Time 1355    SLP Stop Time  1440    SLP Time Calculation (min) 45 min    Activity Tolerance Patient tolerated treatment well             No past medical history on file.  Patient Active Problem List   Diagnosis Date Noted   SVT (supraventricular tachycardia) (HCC)    Subjective visual disturbance 03/03/2020   Mitral regurgitation 08/15/2019   Long term (current) use of anticoagulants 08/15/2019   Cardiac defibrillator in place 08/15/2019   Unsteadiness on feet 10/25/2018   Ascending aortic aneurysm (HCC)    Chronic bilateral low back pain 12/13/2017   Posterior tibial tendonitis, right 12/13/2017   Class 3 severe obesity due to excess calories with serious comorbidity and body mass index (BMI) of 40.0 to 44.9 in adult (HCC) 12/13/2017   Chest mass 05/30/2017   S/P left rotator cuff repair 10/25/2016   Severe obesity (BMI >= 40) (HCC) 09/27/2016   Dilated aortic root (HCC) 07/20/2016   Claudication (HCC) 07/20/2016   Heart murmur 07/20/2016   Trigger finger, left middle finger 06/21/2016   Diabetes (HCC) 05/24/2016   Depression, major, recurrent, moderate (HCC) 11/05/2015   Chronic post-traumatic stress disorder (PTSD) 11/05/2015    Class: Chronic   OSA (obstructive sleep apnea)    Persistent atrial fibrillation (HCC) 08/19/2015   CAD (coronary artery disease), native coronary artery 08/19/2015   Shoulder pain 06/11/2015   Hyperlipidemia 06/10/2015   Tobacco use disorder 05/28/2015   Essential hypertension 05/27/2015   Bipolar disorder (HCC) 05/27/2015   Hypertrophic cardiomyopathy (HCC) 05/27/2015   Atrial  flutter (HCC) 02/20/2014   Fluid overload 02/05/2014   Atelectasis 02/04/2014   Schizoaffective disorder (HCC) 11/27/2013   S/P ventricular septal myectomy 02/13/2013    ONSET DATE: 01/16/23 (referral date); reports difficulty since 2022  REFERRING DIAG: other symptoms and signs involving cognitive functions and awareness R41.89  THERAPY DIAG:  Mild cognitive impairment  Rationale for Evaluation and Treatment Rehabilitation  SUBJECTIVE:   SUBJECTIVE STATEMENT: Pt alert, pleasant, and cooperative. Verbose, at times, but easily redirected. Endorsed not really feeling up to it since his father's passed away this week.   Pt accompanied by: self  PERTINENT HISTORY: Pt is a 58 y.o. male who presents for cognitive-communication evaluation in setting of mild cognitive impaired (onset 2022). Pt with PMHx schizoaffective disorder and Bipolar I, Vit B12 deficiency, cervical and lumbar DDD (being followed by Neurosurgery), CAD, Pt on disability. Pt with complaints of attention, memory, and wordfinding difficulty.  DIAGNOSTIC FINDINGS: MRI brain, 04/12/22, No acute intracranial abnormality. Essentially normal for age MRI appearance of the Brain.  PAIN:  Are you having pain? Yes, back pain; chronic; 6/10  FALLS: Has patient fallen in last 6 months?  No  LIVING ENVIRONMENT: Lives with: lives alone Lives in: House/apartment  PLOF:  Level of assistance: Independent with ADLs    PATIENT GOALS   to improve attention and memory  OBJECTIVE:    TODAY'S TREATMENT:  Initiated education re: types of attention, compensations to improve attention, and factors that may  affect attention. Pt to review hand out at home and ID at least x1 compensation to implement. Discussed role of sleep, medication, pain, and mental health on attention. Pt concerned about overall mental health (e.g. increased background thoughts, press of speech). Pt asking about seeing a Psychiatrist, referring MD made aware  via secure message.    PATIENT EDUCATION: Education details: as above Person educated: Patient Education method: Explanation; Handout Education comprehension: verbalized understanding and needs further education  HOME EXERCISE PROGRAM:       As above    GOALS:  Goals reviewed with patient? Yes  SHORT TERM GOALS: Target date: 10 sessions  With Min A, patient will use strategies to maintain attention to linguistic activities of interest / iADLs. Baseline: Goal status: INITIAL   2.  With Min A, pt will verbalize and demonstrate how to implement x3 memory and attention strategies to aid daily functioning over 2 sessions.  Baseline:  Goal status: INITIAL  3.  Pt will verbalize at least x3 external factors that may affect cognitive-linguistic functioning.  Baseline:  Goal status: INITIAL  4.  Pt will utilize compensatory strategies for anomia during structured tasks with min A.  Baseline:  Goal status: INITIAL    LONG TERM GOALS: Target date: 12 weeks  Patient will demonstrate knowledge of appropriate activities to support cognitive and  language function outside of ST. Baseline:  Goal status: INITIAL  2.  Pt will utilize compensatory strategies for anomia with modified independence to repair communication breakdowns.  Baseline:  Goal status: INITIAL  3.  Pt will report subjective improvement in memory compensation use per PROM. Baseline: MMQ - Use of Memory Strategies Below Average - 30-39 use of Memory Strategies (T-score: 37) Goal status: INITIAL   ASSESSMENT:  CLINICAL IMPRESSION:  Pt is a 58 y.o. male who presents for cognitive-communication evaluation in setting of mild cognitive impaired (onset 2022). Pt with PMHx schizoaffective disorder and Bipolar I, Vit B12 deficiency, cervical and lumbar DDD (being followed by Neurosurgery), CAD. Pt on disability. Pt with complaints of attention, memory, and wordfinding difficulty. Assessment completed via informal means,  Addenbrooke's Cognitive Examination (ACE III), and PROM (Multifactorial Memory Questionnaire). Pt presents with cognitive-linguistic deficits affecting attention, memory, problem solving/executive functioning, and verbal expression. Speech is largely fluent. No overt wordfinding difficulty observed conversationally, but reduced generative naming noted. Suspect pt's attentional deficits affecting all domains of cognition. See details of today's tx session above. Recommend course of ST targeting above mentioned deficits for implementation of compensations and education re: cognitive-linguistic functioning.   OBJECTIVE IMPAIRMENTS include attention, memory, executive functioning, and expressive language. These impairments are limiting patient from household responsibilities, ADLs/IADLs, and effectively communicating at home and in community. Factors affecting potential to achieve goals and functional outcome are co-morbidities.. Patient will benefit from skilled SLP services to address above impairments and improve overall function.  REHAB POTENTIAL: Good  PLAN: SLP FREQUENCY: 1-2x/week  SLP DURATION: 12 weeks  PLANNED INTERVENTIONS: Environmental controls, Cognitive reorganization, Internal/external aids, Functional tasks, SLP instruction and feedback, Compensatory strategies, and Patient/family education    Delon Bangs, M.S., CCC-SLP Speech-Language Pathologist [place] [facility] [phone] [place])  [facility] Outpatient Rehabilitation at [facility] [address] [place], [place], [address] Phone: [phone]   Fax:  [phone]

## 2023-02-06 ENCOUNTER — Ambulatory Visit: Payer: 59

## 2023-02-06 DIAGNOSIS — G3184 Mild cognitive impairment, so stated: Secondary | ICD-10-CM | POA: Diagnosis not present

## 2023-02-06 NOTE — Therapy (Signed)
 OUTPATIENT SPEECH LANGUAGE PATHOLOGY  TREATMENT   Patient Name: Kyle Peterson MRN: [id number] DOB:[birth year], 58 y.o., male Today's Date: 02/06/2023  PCP: none listed REFERRING PROVIDER: Dr. Maree   End of Session - 02/06/23 1439     SLP Start Time 1355    SLP Stop Time  1440    SLP Time Calculation (min) 45 min    Activity Tolerance Patient tolerated treatment well             No past medical history on file.  Patient Active Problem List   Diagnosis Date Noted   SVT (supraventricular tachycardia) (HCC)    Subjective visual disturbance 03/03/2020   Mitral regurgitation 08/15/2019   Long term (current) use of anticoagulants 08/15/2019   Cardiac defibrillator in place 08/15/2019   Unsteadiness on feet 10/25/2018   Ascending aortic aneurysm (HCC)    Chronic bilateral low back pain 12/13/2017   Posterior tibial tendonitis, right 12/13/2017   Class 3 severe obesity due to excess calories with serious comorbidity and body mass index (BMI) of 40.0 to 44.9 in adult (HCC) 12/13/2017   Chest mass 05/30/2017   S/P left rotator cuff repair 10/25/2016   Severe obesity (BMI >= 40) (HCC) 09/27/2016   Dilated aortic root (HCC) 07/20/2016   Claudication (HCC) 07/20/2016   Heart murmur 07/20/2016   Trigger finger, left middle finger 06/21/2016   Diabetes (HCC) 05/24/2016   Depression, major, recurrent, moderate (HCC) 11/05/2015   Chronic post-traumatic stress disorder (PTSD) 11/05/2015    Class: Chronic   OSA (obstructive sleep apnea)    Persistent atrial fibrillation (HCC) 08/19/2015   CAD (coronary artery disease), native coronary artery 08/19/2015   Shoulder pain 06/11/2015   Hyperlipidemia 06/10/2015   Tobacco use disorder 05/28/2015   Essential hypertension 05/27/2015   Bipolar disorder (HCC) 05/27/2015   Hypertrophic cardiomyopathy (HCC) 05/27/2015   Atrial flutter (HCC) 02/20/2014   Fluid overload 02/05/2014   Atelectasis 02/04/2014   Schizoaffective disorder (HCC)  11/27/2013   S/P ventricular septal myectomy 02/13/2013    ONSET DATE: 01/16/23 (referral date); reports difficulty since 2022  REFERRING DIAG: other symptoms and signs involving cognitive functions and awareness R41.89  THERAPY DIAG:  Mild cognitive impairment  Rationale for Evaluation and Treatment Rehabilitation  SUBJECTIVE:   SUBJECTIVE STATEMENT: Pt alert, pleasant, and cooperative. Verbose, at times, but easily redirected.   Pt accompanied by: self  PERTINENT HISTORY: Pt is a 58 y.o. male who presents for cognitive-communication evaluation in setting of mild cognitive impaired (onset 2022). Pt with PMHx schizoaffective disorder and Bipolar I, Vit B12 deficiency, cervical and lumbar DDD (being followed by Neurosurgery), CAD, Pt on disability. Pt with complaints of attention, memory, and wordfinding difficulty.  DIAGNOSTIC FINDINGS: MRI brain, 04/12/22, No acute intracranial abnormality. Essentially normal for age MRI appearance of the Brain.  PAIN:  Are you having pain? Yes, back pain; chronic; 6/10  FALLS: Has patient fallen in last 6 months?  No  LIVING ENVIRONMENT: Lives with: lives alone Lives in: House/apartment  PLOF:  Level of assistance: Independent with ADLs    PATIENT GOALS   to improve attention and memory  OBJECTIVE:    TODAY'S TREATMENT:  Reviewed re: types of attention, compensations to improve attention, and factors that may affect attention. Emphasis placed on active listening strategies, avoidance of multitasking, writing down information, and being upfront with communication partners about difficulty with attention/memory. Pt concerned about overall mental health (e.g. increased background thoughts, press of speech). Referring MD notified last week, pt to f/u  with MD.   PATIENT EDUCATION: Education details: as above Person educated: Patient Education method: Programmer, Multimedia; Handout Education comprehension: verbalized understanding and needs  further education  HOME EXERCISE PROGRAM:       As above    GOALS:  Goals reviewed with patient? Yes  SHORT TERM GOALS: Target date: 10 sessions  With Min A, patient will use strategies to maintain attention to linguistic activities of interest / iADLs. Baseline: Goal status: INITIAL   2.  With Min A, pt will verbalize and demonstrate how to implement x3 memory and attention strategies to aid daily functioning over 2 sessions.  Baseline:  Goal status: INITIAL  3.  Pt will verbalize at least x3 external factors that may affect cognitive-linguistic functioning.  Baseline:  Goal status: INITIAL  4.  Pt will utilize compensatory strategies for anomia during structured tasks with min A.  Baseline:  Goal status: INITIAL    LONG TERM GOALS: Target date: 12 weeks  Patient will demonstrate knowledge of appropriate activities to support cognitive and  language function outside of ST. Baseline:  Goal status: INITIAL  2.  Pt will utilize compensatory strategies for anomia with modified independence to repair communication breakdowns.  Baseline:  Goal status: INITIAL  3.  Pt will report subjective improvement in memory compensation use per PROM. Baseline: MMQ - Use of Memory Strategies Below Average - 30-39 use of Memory Strategies (T-score: 37) Goal status: INITIAL   ASSESSMENT:  CLINICAL IMPRESSION:  Pt is a 58 y.o. male who presents for cognitive-communication evaluation in setting of mild cognitive impaired (onset 2022). Pt with PMHx schizoaffective disorder and Bipolar I, Vit B12 deficiency, cervical and lumbar DDD (being followed by Neurosurgery), CAD. Pt on disability. Pt with complaints of attention, memory, and wordfinding difficulty. Assessment completed via informal means, Addenbrooke's Cognitive Examination (ACE III), and PROM (Multifactorial Memory Questionnaire). Pt presents with cognitive-linguistic deficits affecting attention, memory, problem solving/executive  functioning, and verbal expression. Speech is largely fluent. No overt wordfinding difficulty observed conversationally, but reduced generative naming noted. Suspect pt's attentional deficits affecting all domains of cognition. See details of today's tx session above. Recommend course of ST targeting above mentioned deficits for implementation of compensations and education re: cognitive-linguistic functioning.   OBJECTIVE IMPAIRMENTS include attention, memory, executive functioning, and expressive language. These impairments are limiting patient from household responsibilities, ADLs/IADLs, and effectively communicating at home and in community. Factors affecting potential to achieve goals and functional outcome are co-morbidities.. Patient will benefit from skilled SLP services to address above impairments and improve overall function.  REHAB POTENTIAL: Good  PLAN: SLP FREQUENCY: 1-2x/week  SLP DURATION: 12 weeks  PLANNED INTERVENTIONS: Environmental controls, Cognitive reorganization, Internal/external aids, Functional tasks, SLP instruction and feedback, Compensatory strategies, and Patient/family education    Delon Bangs, M.S., CCC-SLP Speech-Language Pathologist [place] [facility] [phone] [place])  [facility] at [facility] [address] [place], [place], [address] Phone: [phone]   Fax:  [phone]

## 2023-02-08 ENCOUNTER — Ambulatory Visit: Payer: 59

## 2023-02-13 ENCOUNTER — Ambulatory Visit: Payer: 59

## 2023-02-13 DIAGNOSIS — G3184 Mild cognitive impairment, so stated: Secondary | ICD-10-CM | POA: Diagnosis not present

## 2023-02-13 NOTE — Therapy (Signed)
 OUTPATIENT SPEECH LANGUAGE PATHOLOGY  TREATMENT   Patient Name: Kyle Peterson MRN: [id number] DOB:05-24-1965, 58 y.o., male Today's Date: 02/13/2023  PCP: none listed REFERRING PROVIDER: Dr. Maree   End of Session - 02/13/23 1444     Visit Number 4    Number of Visits 24    Date for SLP Re-Evaluation 04/24/23    SLP Start Time 1355    SLP Stop Time  1440    SLP Time Calculation (min) 45 min    Activity Tolerance Patient tolerated treatment well             No past medical history on file.  Patient Active Problem List   Diagnosis Date Noted   SVT (supraventricular tachycardia) (HCC)    Subjective visual disturbance 03/03/2020   Mitral regurgitation 08/15/2019   Long term (current) use of anticoagulants 08/15/2019   Cardiac defibrillator in place 08/15/2019   Unsteadiness on feet 10/25/2018   Ascending aortic aneurysm (HCC)    Chronic bilateral low back pain 12/13/2017   Posterior tibial tendonitis, right 12/13/2017   Class 3 severe obesity due to excess calories with serious comorbidity and body mass index (BMI) of 40.0 to 44.9 in adult (HCC) 12/13/2017   Chest mass 05/30/2017   S/P left rotator cuff repair 10/25/2016   Severe obesity (BMI >= 40) (HCC) 09/27/2016   Dilated aortic root (HCC) 07/20/2016   Claudication (HCC) 07/20/2016   Heart murmur 07/20/2016   Trigger finger, left middle finger 06/21/2016   Diabetes (HCC) 05/24/2016   Depression, major, recurrent, moderate (HCC) 11/05/2015   Chronic post-traumatic stress disorder (PTSD) 11/05/2015    Class: Chronic   OSA (obstructive sleep apnea)    Persistent atrial fibrillation (HCC) 08/19/2015   CAD (coronary artery disease), native coronary artery 08/19/2015   Shoulder pain 06/11/2015   Hyperlipidemia 06/10/2015   Tobacco use disorder 05/28/2015   Essential hypertension 05/27/2015   Bipolar disorder (HCC) 05/27/2015   Hypertrophic cardiomyopathy (HCC) 05/27/2015   Atrial flutter (HCC) 02/20/2014   Fluid  overload 02/05/2014   Atelectasis 02/04/2014   Schizoaffective disorder (HCC) 11/27/2013   S/P ventricular septal myectomy 02/13/2013    ONSET DATE: 01/16/23 (referral date); reports difficulty since 2022  REFERRING DIAG: other symptoms and signs involving cognitive functions and awareness R41.89  THERAPY DIAG:  Mild cognitive impairment  Rationale for Evaluation and Treatment Rehabilitation  SUBJECTIVE:   SUBJECTIVE STATEMENT: Pt alert, pleasant, and cooperative. Verbose, at times.  Pt accompanied by: self  PERTINENT HISTORY: Pt is a 58 y.o. male who presents for cognitive-communication evaluation in setting of mild cognitive impaired (onset 2022). Pt with PMHx schizoaffective disorder and Bipolar I, Vit B12 deficiency, cervical and lumbar DDD (being followed by Neurosurgery), CAD, Pt on disability. Pt with complaints of attention, memory, and wordfinding difficulty.  DIAGNOSTIC FINDINGS: MRI brain, 04/12/22, No acute intracranial abnormality. Essentially normal for age MRI appearance of the Brain.  PAIN:  Are you having pain? Yes, back pain; chronic; 6/10  FALLS: Has patient fallen in last 6 months?  No  LIVING ENVIRONMENT: Lives with: lives alone Lives in: House/apartment  PLOF:  Level of assistance: Independent with ADLs    PATIENT GOALS   to improve attention and memory  OBJECTIVE:    TODAY'S TREATMENT:  Pt verbose and tangential at times. X1 instance of paucity of speech/wordfinding which required extra time to repair. Pt able to employ active listening strategies. Pt continues to be concerned about overall mental health (e.g. increased background thoughts, press of speech).  Referring MD notified last week, pt to f/u with MD. Reviewed role of mental health in overall cognitive functoining. Reviewed purpose / SLP POC. Suspect pt may benefit from Psychology/Psychiatry referral. Pt would like to visit x1 additional visit for ST.   PATIENT EDUCATION: Education  details: as above Person educated: Patient Education method: Explanation; Handout Education comprehension: verbalized understanding and needs further education  HOME EXERCISE PROGRAM:       As above    GOALS:  Goals reviewed with patient? Yes  SHORT TERM GOALS: Target date: 10 sessions  With Min A, patient will use strategies to maintain attention to linguistic activities of interest / iADLs. Baseline: Goal status: INITIAL   2.  With Min A, pt will verbalize and demonstrate how to implement x3 memory and attention strategies to aid daily functioning over 2 sessions.  Baseline:  Goal status: INITIAL  3.  Pt will verbalize at least x3 external factors that may affect cognitive-linguistic functioning.  Baseline:  Goal status: INITIAL  4.  Pt will utilize compensatory strategies for anomia during structured tasks with min A.  Baseline:  Goal status: INITIAL    LONG TERM GOALS: Target date: 12 weeks  Patient will demonstrate knowledge of appropriate activities to support cognitive and  language function outside of ST. Baseline:  Goal status: INITIAL  2.  Pt will utilize compensatory strategies for anomia with modified independence to repair communication breakdowns.  Baseline:  Goal status: INITIAL  3.  Pt will report subjective improvement in memory compensation use per PROM. Baseline: MMQ - Use of Memory Strategies Below Average - 30-39 use of Memory Strategies (T-score: 37) Goal status: INITIAL   ASSESSMENT:  CLINICAL IMPRESSION:  Pt is a 58 y.o. male who presents for cognitive-communication evaluation in setting of mild cognitive impaired (onset 2022). Pt with PMHx schizoaffective disorder and Bipolar I, Vit B12 deficiency, cervical and lumbar DDD (being followed by Neurosurgery), CAD. Pt on disability. Pt with complaints of attention, memory, and wordfinding difficulty. Assessment completed via informal means, Addenbrooke's Cognitive Examination (ACE III), and PROM  (Multifactorial Memory Questionnaire). Pt presents with cognitive-linguistic deficits affecting attention, memory, problem solving/executive functioning, and verbal expression. Speech is largely fluent. No overt wordfinding difficulty observed conversationally, but reduced generative naming noted. Suspect pt's attentional deficits affecting all domains of cognition. See details of today's tx session above. Recommend course of ST targeting above mentioned deficits for implementation of compensations and education re: cognitive-linguistic functioning.   OBJECTIVE IMPAIRMENTS include attention, memory, executive functioning, and expressive language. These impairments are limiting patient from household responsibilities, ADLs/IADLs, and effectively communicating at home and in community. Factors affecting potential to achieve goals and functional outcome are co-morbidities.. Patient will benefit from skilled SLP services to address above impairments and improve overall function.  REHAB POTENTIAL: Good  PLAN: SLP FREQUENCY: 1-2x/week  SLP DURATION: 12 weeks  PLANNED INTERVENTIONS: Environmental controls, Cognitive reorganization, Internal/external aids, Functional tasks, SLP instruction and feedback, Compensatory strategies, and Patient/family education    Delon Bangs, M.S., CCC-SLP Speech-Language Pathologist [place] [facility] [phone] [place])  [place] [facility] Outpatient Rehabilitation at [facility] [address] [place], [place], [address] Phone: [phone]   Fax:  [phone]

## 2023-02-15 ENCOUNTER — Ambulatory Visit: Payer: 59

## 2023-02-15 DIAGNOSIS — G3184 Mild cognitive impairment, so stated: Secondary | ICD-10-CM

## 2023-02-15 NOTE — Therapy (Signed)
 OUTPATIENT SPEECH LANGUAGE PATHOLOGY  DISCHARGE SUMMARY   Patient Name: Kyle Peterson MRN: [id number] DOB:[birth year], 58 y.o., male Today's Date: 02/15/2023  PCP: none listed REFERRING PROVIDER: Dr. Mason Sole   End of Session - 02/15/23 1443     Visit Number 5    Number of Visits 24    Date for SLP Re-Evaluation 04/24/23    SLP Start Time 1400    SLP Stop Time  1435    SLP Time Calculation (min) 35 min    Activity Tolerance Patient tolerated treatment well             No past medical history on file.  Patient Active Problem List   Diagnosis Date Noted   SVT (supraventricular tachycardia) (HCC)    Subjective visual disturbance 03/03/2020   Mitral regurgitation 08/15/2019   Long term (current) use of anticoagulants 08/15/2019   Cardiac defibrillator in place 08/15/2019   Unsteadiness on feet 10/25/2018   Ascending aortic aneurysm (HCC)    Chronic bilateral low back pain 12/13/2017   Posterior tibial tendonitis, right 12/13/2017   Class 3 severe obesity due to excess calories with serious comorbidity and body mass index (BMI) of 40.0 to 44.9 in adult (HCC) 12/13/2017   Chest mass 05/30/2017   S/P left rotator cuff repair 10/25/2016   Severe obesity (BMI >= 40) (HCC) 09/27/2016   Dilated aortic root (HCC) 07/20/2016   Claudication (HCC) 07/20/2016   Heart murmur 07/20/2016   Trigger finger, left middle finger 06/21/2016   Diabetes (HCC) 05/24/2016   Depression, major, recurrent, moderate (HCC) 11/05/2015   Chronic post-traumatic stress disorder (PTSD) 11/05/2015    Class: Chronic   OSA (obstructive sleep apnea)    Persistent atrial fibrillation (HCC) 08/19/2015   CAD (coronary artery disease), native coronary artery 08/19/2015   Shoulder pain 06/11/2015   Hyperlipidemia 06/10/2015   Tobacco use disorder 05/28/2015   Essential hypertension 05/27/2015   Bipolar disorder (HCC) 05/27/2015   Hypertrophic cardiomyopathy (HCC) 05/27/2015   Atrial flutter (HCC) 02/20/2014    Fluid overload 02/05/2014   Atelectasis 02/04/2014   Schizoaffective disorder (HCC) 11/27/2013   S/P ventricular septal myectomy 02/13/2013    ONSET DATE: 01/16/23 (referral date); reports difficulty since 2022  REFERRING DIAG: other symptoms and signs involving cognitive functions and awareness R41.89  THERAPY DIAG:  Mild cognitive impairment  Rationale for Evaluation and Treatment Rehabilitation  SUBJECTIVE:   SUBJECTIVE STATEMENT: Pt alert, pleasant, and cooperative. Verbose, at times.  Pt accompanied by: self  PERTINENT HISTORY: Pt is a 58 y.o. male who presents for cognitive-communication evaluation in setting of mild cognitive impaired (onset 2022). Pt with PMHx schizoaffective disorder and Bipolar I, Vit B12 deficiency, cervical and lumbar DDD (being followed by Neurosurgery), CAD, Pt on disability. Pt with complaints of attention, memory, and wordfinding difficulty.  DIAGNOSTIC FINDINGS: MRI brain, 04/12/22, "No acute intracranial abnormality. Essentially normal for age MRI appearance of the Brain."  PAIN:  Are you having pain? Yes, back pain; chronic; 6/10  FALLS: Has patient fallen in last 6 months?  No  LIVING ENVIRONMENT: Lives with: lives alone Lives in: House/apartment  PLOF:  Level of assistance: Independent with ADLs    PATIENT GOALS   to improve attention and memory  OBJECTIVE:    TODAY'S TREATMENT:  Pt verbose and tangential at times. No s/sx anomia noted. Pt able to employ active listening strategies. Pt continues to be concerned about overall mental health (e.g. increased background thoughts, press of speech). Referring MD notified last week, pt to  f/u with MD. Reviewed role of mental health in overall cognitive functoining. Reviewed purpose / SLP POC. At this time, pt's needs are likely better met at Psychology/Psychiatry clinic. ST to be d/c'd at this time. Pt agreed.   PATIENT EDUCATION: Education details: as above Person educated:  Patient Education method: Programmer, multimedia; Handout Education comprehension: verbalized understanding   HOME EXERCISE PROGRAM:       N/A    GOALS:  Goals reviewed with patient? Yes  SHORT TERM GOALS: Target date: 10 sessions  With Min A, patient will use strategies to maintain attention to linguistic activities of interest / iADLs. Baseline: Goal status: MET   2.  With Min A, pt will verbalize and demonstrate how to implement x3 memory and attention strategies to aid daily functioning over 2 sessions.  Baseline:  Goal status: ADEQUATE FOR D/C  3.  Pt will verbalize at least x3 external factors that may affect cognitive-linguistic functioning.  Baseline:  Goal status: MET  4.  Pt will utilize compensatory strategies for anomia during structured tasks with min A.  Baseline:  Goal status: ADEQUATE FOR D/C    LONG TERM GOALS: Target date: 12 weeks  Patient will demonstrate knowledge of appropriate activities to support cognitive and  language function outside of ST. Baseline:  Goal status: MET  2.  Pt will utilize compensatory strategies for anomia with modified independence to repair communication breakdowns.  Baseline:  Goal status: ADEQUATE FOR D/C  3.  Pt will report subjective improvement in memory compensation use per PROM. Baseline: MMQ - Use of Memory Strategies Below Average - 30-39 use of Memory Strategies (T-score: 37) Goal status: NOT MET   ASSESSMENT:  D/C ST    Dia Forget, M.S., CCC-SLP Speech-Language Pathologist [place] [facility] [phone] [place])  [place] [facility] Outpatient Rehabilitation at [place] [address] [place], [place], [address] Phone: [phone]   Fax:  [phone]

## 2023-02-20 ENCOUNTER — Ambulatory Visit: Payer: 59

## 2023-02-22 ENCOUNTER — Ambulatory Visit: Payer: 59

## 2023-02-27 ENCOUNTER — Ambulatory Visit: Payer: 59

## 2023-03-01 ENCOUNTER — Ambulatory Visit: Payer: 59

## 2023-03-06 ENCOUNTER — Ambulatory Visit: Payer: 59

## 2023-03-14 ENCOUNTER — Ambulatory Visit: Payer: 59

## 2023-03-16 NOTE — Addendum Note (Signed)
Addended by: Woodroe Chen on: 03/16/2023 07:50 AM   Modules accepted: Orders

## 2023-06-13 ENCOUNTER — Ambulatory Visit: Payer: Medicare Other

## 2023-09-12 ENCOUNTER — Ambulatory Visit: Payer: Medicare Other

## 2023-12-12 ENCOUNTER — Ambulatory Visit: Payer: Medicare Other
